# Patient Record
Sex: Male | Born: 1945 | Race: Black or African American | Hispanic: No | Marital: Married | Smoking: Former smoker
Health system: Southern US, Community
[De-identification: ages and names within clinical notes are randomized; demographics above are authoritative.]

## PROBLEM LIST (undated history)

## (undated) DIAGNOSIS — K219 Gastro-esophageal reflux disease without esophagitis: Secondary | ICD-10-CM

## (undated) DIAGNOSIS — I2510 Atherosclerotic heart disease of native coronary artery without angina pectoris: Secondary | ICD-10-CM

## (undated) DIAGNOSIS — I43 Cardiomyopathy in diseases classified elsewhere: Secondary | ICD-10-CM

## (undated) DIAGNOSIS — I482 Chronic atrial fibrillation, unspecified: Secondary | ICD-10-CM

## (undated) DIAGNOSIS — N400 Enlarged prostate without lower urinary tract symptoms: Secondary | ICD-10-CM

## (undated) DIAGNOSIS — M1990 Unspecified osteoarthritis, unspecified site: Secondary | ICD-10-CM

## (undated) DIAGNOSIS — N186 End stage renal disease: Secondary | ICD-10-CM

## (undated) DIAGNOSIS — I10 Essential (primary) hypertension: Secondary | ICD-10-CM

## (undated) DIAGNOSIS — Z992 Dependence on renal dialysis: Secondary | ICD-10-CM

## (undated) DIAGNOSIS — E854 Organ-limited amyloidosis: Secondary | ICD-10-CM

## (undated) DIAGNOSIS — C801 Malignant (primary) neoplasm, unspecified: Secondary | ICD-10-CM

## (undated) HISTORY — PX: AV FISTULA PLACEMENT: SHX1204

## (undated) HISTORY — PX: HERNIA REPAIR: SHX51

## (undated) HISTORY — PX: CHOLECYSTECTOMY: SHX55

## (undated) HISTORY — PX: NEPHRECTOMY: SHX65

---

## 2002-09-06 ENCOUNTER — Ambulatory Visit (HOSPITAL_COMMUNITY): Admission: RE | Admit: 2002-09-06 | Discharge: 2002-09-06 | Payer: Self-pay | Admitting: Orthopaedic Surgery

## 2002-09-06 ENCOUNTER — Encounter: Payer: Self-pay | Admitting: Orthopaedic Surgery

## 2003-02-24 DIAGNOSIS — I482 Chronic atrial fibrillation, unspecified: Secondary | ICD-10-CM

## 2003-02-24 HISTORY — DX: Chronic atrial fibrillation, unspecified: I48.20

## 2003-03-09 ENCOUNTER — Ambulatory Visit (HOSPITAL_COMMUNITY): Admission: RE | Admit: 2003-03-09 | Discharge: 2003-03-09 | Payer: Self-pay | Admitting: Family Medicine

## 2003-03-09 ENCOUNTER — Encounter: Payer: Self-pay | Admitting: Family Medicine

## 2003-03-22 ENCOUNTER — Ambulatory Visit (HOSPITAL_COMMUNITY): Admission: RE | Admit: 2003-03-22 | Discharge: 2003-03-22 | Payer: Self-pay | Admitting: Family Medicine

## 2003-03-22 ENCOUNTER — Encounter: Payer: Self-pay | Admitting: Family Medicine

## 2003-04-09 ENCOUNTER — Encounter: Payer: Self-pay | Admitting: Urology

## 2003-04-09 ENCOUNTER — Ambulatory Visit (HOSPITAL_COMMUNITY): Admission: RE | Admit: 2003-04-09 | Discharge: 2003-04-09 | Payer: Self-pay | Admitting: Urology

## 2003-04-12 ENCOUNTER — Encounter: Payer: Self-pay | Admitting: Urology

## 2003-04-12 ENCOUNTER — Encounter (HOSPITAL_COMMUNITY): Admission: RE | Admit: 2003-04-12 | Discharge: 2003-05-12 | Payer: Self-pay | Admitting: Urology

## 2003-10-05 ENCOUNTER — Ambulatory Visit (HOSPITAL_COMMUNITY): Admission: RE | Admit: 2003-10-05 | Discharge: 2003-10-05 | Payer: Self-pay | Admitting: Family Medicine

## 2003-10-08 ENCOUNTER — Ambulatory Visit (HOSPITAL_COMMUNITY): Admission: RE | Admit: 2003-10-08 | Discharge: 2003-10-08 | Payer: Self-pay | Admitting: Family Medicine

## 2003-11-02 ENCOUNTER — Ambulatory Visit (HOSPITAL_COMMUNITY): Admission: RE | Admit: 2003-11-02 | Discharge: 2003-11-02 | Payer: Self-pay | Admitting: Internal Medicine

## 2003-12-11 ENCOUNTER — Ambulatory Visit (HOSPITAL_COMMUNITY): Admission: RE | Admit: 2003-12-11 | Discharge: 2003-12-11 | Payer: Self-pay | Admitting: Family Medicine

## 2004-05-26 ENCOUNTER — Ambulatory Visit (HOSPITAL_COMMUNITY): Admission: RE | Admit: 2004-05-26 | Discharge: 2004-05-26 | Payer: Self-pay | Admitting: Family Medicine

## 2004-06-07 ENCOUNTER — Emergency Department (HOSPITAL_COMMUNITY): Admission: EM | Admit: 2004-06-07 | Discharge: 2004-06-07 | Payer: Self-pay | Admitting: Emergency Medicine

## 2004-06-08 ENCOUNTER — Emergency Department (HOSPITAL_COMMUNITY): Admission: EM | Admit: 2004-06-08 | Discharge: 2004-06-09 | Payer: Self-pay | Admitting: *Deleted

## 2004-08-28 ENCOUNTER — Ambulatory Visit (HOSPITAL_COMMUNITY): Admission: RE | Admit: 2004-08-28 | Discharge: 2004-08-28 | Payer: Self-pay | Admitting: Cardiovascular Disease

## 2004-08-28 ENCOUNTER — Ambulatory Visit: Payer: Self-pay | Admitting: Cardiovascular Disease

## 2004-09-15 ENCOUNTER — Ambulatory Visit (HOSPITAL_COMMUNITY): Admission: RE | Admit: 2004-09-15 | Discharge: 2004-09-15 | Payer: Self-pay | Admitting: *Deleted

## 2004-09-22 ENCOUNTER — Inpatient Hospital Stay (HOSPITAL_COMMUNITY): Admission: AD | Admit: 2004-09-22 | Discharge: 2004-09-26 | Payer: Self-pay | Admitting: *Deleted

## 2004-09-23 ENCOUNTER — Encounter (INDEPENDENT_AMBULATORY_CARE_PROVIDER_SITE_OTHER): Payer: Self-pay | Admitting: Specialist

## 2004-11-30 ENCOUNTER — Inpatient Hospital Stay (HOSPITAL_COMMUNITY): Admission: EM | Admit: 2004-11-30 | Discharge: 2004-12-04 | Payer: Self-pay | Admitting: Emergency Medicine

## 2004-12-30 ENCOUNTER — Ambulatory Visit (HOSPITAL_COMMUNITY): Admission: RE | Admit: 2004-12-30 | Discharge: 2004-12-30 | Payer: Self-pay | Admitting: *Deleted

## 2005-03-17 ENCOUNTER — Emergency Department (HOSPITAL_COMMUNITY): Admission: EM | Admit: 2005-03-17 | Discharge: 2005-03-17 | Payer: Self-pay | Admitting: Emergency Medicine

## 2005-03-21 ENCOUNTER — Emergency Department (HOSPITAL_COMMUNITY): Admission: EM | Admit: 2005-03-21 | Discharge: 2005-03-21 | Payer: Self-pay | Admitting: Emergency Medicine

## 2005-03-21 ENCOUNTER — Ambulatory Visit: Payer: Self-pay | Admitting: Pulmonary Disease

## 2005-03-21 ENCOUNTER — Inpatient Hospital Stay (HOSPITAL_COMMUNITY): Admission: AD | Admit: 2005-03-21 | Discharge: 2005-03-27 | Payer: Self-pay | Admitting: Internal Medicine

## 2005-03-23 ENCOUNTER — Encounter (INDEPENDENT_AMBULATORY_CARE_PROVIDER_SITE_OTHER): Payer: Self-pay | Admitting: Cardiovascular Disease

## 2005-05-12 ENCOUNTER — Ambulatory Visit (HOSPITAL_COMMUNITY): Admission: RE | Admit: 2005-05-12 | Discharge: 2005-05-12 | Payer: Self-pay | Admitting: Urology

## 2005-10-21 ENCOUNTER — Encounter (HOSPITAL_COMMUNITY): Admission: RE | Admit: 2005-10-21 | Discharge: 2005-10-21 | Payer: Self-pay | Admitting: Nephrology

## 2005-10-21 ENCOUNTER — Ambulatory Visit (HOSPITAL_COMMUNITY): Payer: Self-pay | Admitting: Nephrology

## 2005-11-04 ENCOUNTER — Encounter (HOSPITAL_COMMUNITY): Admission: RE | Admit: 2005-11-04 | Discharge: 2005-12-04 | Payer: Self-pay | Admitting: Nephrology

## 2005-11-16 ENCOUNTER — Ambulatory Visit (HOSPITAL_COMMUNITY): Admission: RE | Admit: 2005-11-16 | Discharge: 2005-11-16 | Payer: Self-pay | Admitting: Vascular Surgery

## 2006-01-19 ENCOUNTER — Ambulatory Visit (HOSPITAL_COMMUNITY): Admission: RE | Admit: 2006-01-19 | Discharge: 2006-01-19 | Payer: Self-pay | Admitting: Nephrology

## 2006-06-29 ENCOUNTER — Ambulatory Visit (HOSPITAL_COMMUNITY): Admission: RE | Admit: 2006-06-29 | Discharge: 2006-06-29 | Payer: Self-pay | Admitting: Internal Medicine

## 2007-05-17 ENCOUNTER — Ambulatory Visit: Payer: Self-pay | Admitting: Vascular Surgery

## 2007-07-06 ENCOUNTER — Ambulatory Visit (HOSPITAL_COMMUNITY): Admission: RE | Admit: 2007-07-06 | Discharge: 2007-07-06 | Payer: Self-pay | Admitting: Nephrology

## 2007-08-03 ENCOUNTER — Ambulatory Visit: Payer: Self-pay | Admitting: Vascular Surgery

## 2007-08-03 ENCOUNTER — Ambulatory Visit (HOSPITAL_COMMUNITY): Admission: RE | Admit: 2007-08-03 | Discharge: 2007-08-03 | Payer: Self-pay | Admitting: Vascular Surgery

## 2007-08-04 ENCOUNTER — Ambulatory Visit (HOSPITAL_COMMUNITY): Admission: RE | Admit: 2007-08-04 | Discharge: 2007-08-04 | Payer: Self-pay | Admitting: Vascular Surgery

## 2007-08-11 ENCOUNTER — Ambulatory Visit (HOSPITAL_COMMUNITY): Admission: RE | Admit: 2007-08-11 | Discharge: 2007-08-11 | Payer: Self-pay | Admitting: Surgery

## 2007-11-21 ENCOUNTER — Ambulatory Visit (HOSPITAL_COMMUNITY): Admission: RE | Admit: 2007-11-21 | Discharge: 2007-11-21 | Payer: Self-pay | Admitting: Nephrology

## 2008-03-14 ENCOUNTER — Ambulatory Visit (HOSPITAL_COMMUNITY): Admission: RE | Admit: 2008-03-14 | Discharge: 2008-03-14 | Payer: Self-pay | Admitting: Nephrology

## 2008-03-28 ENCOUNTER — Ambulatory Visit (HOSPITAL_COMMUNITY): Admission: RE | Admit: 2008-03-28 | Discharge: 2008-03-28 | Payer: Self-pay | Admitting: Nephrology

## 2008-06-20 ENCOUNTER — Ambulatory Visit (HOSPITAL_COMMUNITY): Admission: RE | Admit: 2008-06-20 | Discharge: 2008-06-20 | Payer: Self-pay | Admitting: Nephrology

## 2008-09-11 ENCOUNTER — Ambulatory Visit (HOSPITAL_COMMUNITY): Admission: RE | Admit: 2008-09-11 | Discharge: 2008-09-11 | Payer: Self-pay | Admitting: Nephrology

## 2008-11-26 ENCOUNTER — Ambulatory Visit (HOSPITAL_COMMUNITY): Admission: RE | Admit: 2008-11-26 | Discharge: 2008-11-26 | Payer: Self-pay | Admitting: Nephrology

## 2009-10-22 ENCOUNTER — Ambulatory Visit (HOSPITAL_COMMUNITY): Admission: RE | Admit: 2009-10-22 | Discharge: 2009-10-22 | Payer: Self-pay | Admitting: Nephrology

## 2010-01-21 ENCOUNTER — Ambulatory Visit (HOSPITAL_COMMUNITY): Admission: RE | Admit: 2010-01-21 | Discharge: 2010-01-21 | Payer: Self-pay | Admitting: Family Medicine

## 2010-10-10 ENCOUNTER — Ambulatory Visit (HOSPITAL_COMMUNITY)
Admission: RE | Admit: 2010-10-10 | Discharge: 2010-10-10 | Payer: Self-pay | Source: Home / Self Care | Attending: Nephrology | Admitting: Nephrology

## 2010-11-15 ENCOUNTER — Encounter: Payer: Self-pay | Admitting: Family Medicine

## 2010-11-16 ENCOUNTER — Encounter: Payer: Self-pay | Admitting: Nephrology

## 2011-03-10 NOTE — Op Note (Signed)
[name], [name]                ACCOUNT NO.:  [id number]   MEDICAL RECORD NO.:  [id number]          PATIENT TYPE:  AMB   LOCATION:  SDS                          FACILITY:  [facility]   PHYSICIAN:  Juleen China IV, MDDATE OF BIRTH:  1946/05/03   DATE OF PROCEDURE:  08/11/2007  DATE OF DISCHARGE:  08/11/2007                               OPERATIVE REPORT   PROCEDURES PERFORMED:  1. Central venogram.  2. Ultrasound guided right cidofovir access.   PREOPERATIVE DIAGNOSIS:  End stage renal disease.   POSTOPERATIVE DIAGNOSIS:  End stage renal disease.   INDICATIONS FOR PROCEDURE:  A 65 year old who had been dialyzing through  a left arm fistula which thrombosed.  At the time of thrombectomy, there  was difficulty passing the balloon centrally.  The fistula ultimately  rethrombosed.  He now dialyzes through a catheter.  Prior to proceeding  with upper arm graft, I wanted to study his central veins and intervene  should a stenosis be discovered.  This was discussed with the patient  and informed consent was signed.   PROCEDURE:  The patient was identified in the holding area and taken to  room 8.  He was placed supine on the table.  Bilateral groins were  prepped and draped in the standard sterile fashion.  Lidocaine 1% was  used for local anesthesia.  Ultrasound was used to evaluate the right  common femoral vein which is noted to be widely patent.  Under  ultrasound guidance, the right common femoral vein was accessed with an  18-gauge needle.  An 0.035 Teena Dunk wire was then advanced into the  inferior vena cava under fluoroscopic visualization.  Next, a 5-French  sheath was placed.  Using a Berenstein catheter and a Benson wire, the  catheter was placed into the left brachial vein.  Multiple injections  were taken beginning in the brachial vein, injecting in the axillary as  well as the subclavian veins.   FINDINGS:  There was no evidence of central stenosis in the left.  The  brachial veins are patent as is the axillary subclavian and superior  vena cava.  After these findings, the procedure was terminated.  Manual  pressure was used for hemostasis.  The patient tolerated the procedure  without complications.      Jorge Ny, MD  Electronically Signed     VWB/MEDQ  D:  08/11/2007  T:  08/11/2007  Job:  [id number]

## 2011-03-10 NOTE — Op Note (Signed)
NAMEMILO, [name]                ACCOUNT NO.:  [id number]   MEDICAL RECORD NO.:  [id number]          PATIENT TYPE:  AMB   LOCATION:  SDS                          FACILITY:  [facility]   PHYSICIAN:  Di Kindle. Edilia Bo, M.D.DATE OF BIRTH:  [birth year]   DATE OF PROCEDURE:  08/04/2007  DATE OF DISCHARGE:  08/04/2007                               OPERATIVE REPORT   PROCEDURE PERFORMED:  Ultrasound guided placement of a right IJ Diatek  catheter.   PREOPERATIVE DIAGNOSIS:  Chronic renal failure.   POSTOPERATIVE DIAGNOSIS:  Chronic renal failure.   SURGEON:  Waverly Ferrari, M.D.   ANESTHESIA:  Local with sedation.   TECHNIQUE:  The patient was taken to the operating room and the neck and  upper chest were prepped and draped in the usual sterile fashion.  The  ultrasound scan was used to identify the IJ.  After the skin was  anesthetized the IJ was cannulated and the guidewire introduced into the  superior vena cava under fluoroscopic control.  The tract over the wire  was dilated and then a dilator and peel away sheath were passed over the  wire and the wire and dilator were removed.  The catheter was positioned  in the right atrium.  The exit site for the catheter was selected and  the skin anesthetized between the two areas.  The catheter was then  brought through the tunnel, cut to the appropriate length, and the  distal ports were attached.  Both ports withdrew easily, were then  flushed with heparinized saline and filled with concentrated heparin.  The catheter was secured at its exit site with a 3-0 nylon suture.  The  IJ cannulation site was closed with a 4-0 subcuticular stitch.  A  sterile dressing was applied.   The patient tolerated the procedure well and was transferred to the  recovery room in satisfactory condition.      Di Kindle. Edilia Bo, M.D.  Electronically Signed     CSD/MEDQ  D:  08/04/2007  T:  08/12/2007  Job:  [id number]   cc:   Di Kindle.  Edilia Bo, M.D.

## 2011-03-10 NOTE — Assessment & Plan Note (Signed)
OFFICE VISIT   [name], [name]  DOB:  1946/07/19                                       05/17/2007  ZOXWR#:[id number]   [name] has an AV fistula in his left upper arm which I created in  January 2007 and has functioned nicely.  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  He  has had no significant bleeding episodes and no infection.  Dr. Kristian Covey  was concerned about whether anything should be done about it at this  time.  The only option would be to replace the entire fistula with an  upper arm graft but I do think that some more utilization could be  obtained from this fistula as long as he is not having bleeding episodes  or infection.  The patient would like to pursue this option.  I  discussed with him the fact that if there were bleeding episodes, to be  sure and get in touch with [place] and we would then schedule him for  replacement of this fistula with an upper arm graft.   Quita Skye Hart [place], M.D.  Electronically Signed   JDL/MEDQ  D:  05/17/2007  T:  05/18/2007  Job:  [id number]

## 2011-03-10 NOTE — Op Note (Signed)
[name], [name]                ACCOUNT NO.:  [id number]   MEDICAL RECORD NO.:  [id number]          PATIENT TYPE:  AMB   LOCATION:  SDS                          FACILITY:  [facility]   PHYSICIAN:  Juleen China IV, MDDATE OF BIRTH:  1946/02/08   DATE OF PROCEDURE:  08/11/2007  DATE OF DISCHARGE:  08/11/2007                               OPERATIVE REPORT   PREOPERATIVE DIAGNOSIS:  End-stage renal disease.   POSTOPERATIVE DIAGNOSIS:  End-stage renal disease.   PROCEDURE PERFORMED:  Left upper arm __________  graft.   ANESTHESIA:  MAC.   BLOOD LOSS:  Minimal.   SPECIMENS:  None.   DRAINS:  None.   CULTURES:  None.   FINDINGS:  Adequate vein.   PROCEDURE:  The patient was identified in the holding area and taken to  room 9.  He was placed supine on the table.  The left arm was prepped  and draped in standard sterile fashion.  A time out was called,  perioperative antibiotics were administered.  After 1% lidocaine was  used for local anesthesia a longitudinal incision was made at the medial  axilla and the bicipital groove.  Bovie cautery was used to dissect  through the subcutaneous tissue. Crural fascia was identified and opened  sharply.  There was a dominant brachial vein, which appeared to be  adequate for access.  This was mobilized and dissected out  circumferentially.  Once adequate vein had been exposed attention was  turned towards the arterial end.  Lidocaine 1% was used for local  anesthesia.  A longitudinal incision was made over the palpable pulse of  brachial artery above the antecubital crease.  Bovie cautery was used to  dissect through the subcutaneous tissue.  The fascia was opened sharply.  The brachial artery was mobilized proximally and distally until enough  length had been achieved.  At this point in time a subcutaneous tunnel  was created using a curved tunneler.  Once the tunnel was complete the  patient was given systemic heparinization.  A 6-mm  stretch Gore-Tex  graft was then selected.  Vascular clamps were placed proximally and  distally on the brachial artery.  The brachial artery was opened with a  #11 blade and extended with Potts scissors.  The 6-mm graft was cut to  fit the arteriotomy.  Then anastomosis was performed using a running CV-  6 Gore suture.  Prior to completion of the anastomosis the brachial  artery was flushed in antegrade and retrograde fashion.  The anastomosis  was then secured.  At this point in time attention was turned towards  the anus end.  The vein was occluded proximally and distally, and opened  with a #11 blade.  This was extended with Potts scissors.  The graft was  beveled and a long venous anastomosis was performed.  This was done  using a running CV-6 suture.  The graft then was appropriately flushed,  and then the anastomosis was secured.  There was a good thrill within  the axillary vein following completion of the anastomosis.  There was a  good  thrill within the graft.  The patient had a palpable radial pulse.  At this point, the wounds were  copiously irrigated.  The deep tissue was reapproximated in several  areas with interrupted 3-0 Vicryl.  The skin was closed with a running 4-  0 Vicryl.  Dermabond was used on the skin.   The patient tolerated the procedure well.  Was returned to the recovery  room in stable condition.      Jorge Ny, MD  Electronically Signed     VWB/MEDQ  D:  08/11/2007  T:  08/12/2007  Job:  [phone]

## 2011-03-10 NOTE — Op Note (Signed)
NAMECORNELL, [name]                ACCOUNT NO.:  [id number]   MEDICAL RECORD NO.:  [id number]          PATIENT TYPE:  AMB   LOCATION:  SDS                          FACILITY:  [facility]   PHYSICIAN:  Di Kindle. Edilia Bo, M.D.DATE OF BIRTH:  06/17/1946   DATE OF PROCEDURE:  08/04/2007  DATE OF DISCHARGE:  08/04/2007                               OPERATIVE REPORT   PREOPERATIVE DIAGNOSIS:  Chronic renal failure.   POSTOPERATIVE DIAGNOSIS:  Chronic renal failure.   PROCEDURE:  Ultrasound-guided placement of a right internal jugular  Diatek catheter.   SURGEON:  Di Kindle. Edilia Bo, M.D.   ANESTHESIA:  Local with sedation.   TECHNIQUE:  The patient was taken to the operating room.  The neck and  upper chest were prepped and draped in the usual sterile fashion.  The  ultrasound scanner was used to locate the IJ on the right.  After the  skin was anesthetized, the right IJ was cannulated and a guidewire  introduced into the superior vena cava under fluoroscopic control.  The  tract over the wire was dilated and then a dilator and peel-away sheath  were passed over the wire and wire and dilator were removed.  The  catheter was passed through the peel-away sheath and positioned in the  right atrium.  The exit site for the catheter was selected and the skin  anesthetized between the two areas.  The catheter was then brought  through the tunnel, cut to the appropriate length, and the distal ports  were attached.  Both ports withdrew easily and then flushed with  heparinized saline, then filled with concentrated heparin.  The catheter  was secured at its exit site with a 3-0 nylon suture.  The IJ  cannulation site was closed with a 4-0 subcuticular stitch.  A sterile  dressing was applied.  The patient tolerated the procedure well, was  transferred to the recovery room in satisfactory condition.  All needle  and sponge counts were correct.      Di Kindle. Edilia Bo, M.D.  Electronically Signed     CSD/MEDQ  D:  08/04/2007  T:  08/05/2007  Job:  [id number]

## 2011-03-10 NOTE — Procedures (Signed)
OPERATIVE REPORT   YAASIR, MENKEN  DOB:  1946/07/20                                       08/04/2007  VWUJW#:[id number]   PROCEDURE PERFORMED:  Ultrasound guided placement of a right IJ Diatek  catheter.   PREOPERATIVE DIAGNOSIS:  Chronic renal failure.   POSTOPERATIVE DIAGNOSIS:  Chronic renal failure.   SURGEON:  Waverly Ferrari, M.D.   ANESTHESIA:  Local with sedation.   TECHNIQUE:  The patient was taken to the operating room and the neck and  upper chest were prepped and draped in the usual sterile fashion.  The  ultrasound scan was used to identify the IJ.  After the skin was  anesthetized the IJ was cannulated and the guidewire introduced into the  superior vena cava under fluoroscopic control.  The tract over the wire  was dilated and then a dilator and peel away sheath were passed over the  wire and the wire and dilator were removed.  The catheter was positioned  in the right atrium.  The exit site for the catheter was selected and  the skin anesthetized between the two areas.  The catheter was then  brought through the tunnel, cut to the appropriate length, and the  distal ports were attached.  Both ports withdrew easily, were then  flushed with heparinized saline and filled with concentrated heparin.  The catheter was secured at its exit site with a 3-0 nylon suture.  The  IJ cannulation site was closed with a 4-0 subcuticular stitch.  A  sterile dressing was applied.   The patient tolerated the procedure well and was transferred to the  recovery room in satisfactory condition.   Di Kindle. Edilia Bo, M.D.  Electronically Signed   CSD/MEDQ  D:  08/04/2007  T:  08/04/2007  Job:  [id number]   cc:   Di Kindle. Edilia Bo, M.D.

## 2011-03-10 NOTE — Op Note (Signed)
NAMEKEYONTAE, HUCKEBY                ACCOUNT NO.:  [id number]   MEDICAL RECORD NO.:  [id number]          PATIENT TYPE:  AMB   LOCATION:  SDS                          FACILITY:  [facility]   PHYSICIAN:  Quita Skye. Hart [place], M.D.  DATE OF BIRTH:  Aug 20, 1946   DATE OF PROCEDURE:  08/03/2007  DATE OF DISCHARGE:  08/03/2007                               OPERATIVE REPORT   PREOPERATIVE DIAGNOSIS:  End-stage renal disease with thrombosed left  upper arm AV fistula.   POSTOPERATIVE DIAGNOSIS:  End-stage renal disease with thrombosed left  upper arm AV fistula.   OPERATIONS:  A simple thrombectomy of left upper arm fistula with  intraoperative fistulogram.   SURGEON:  Quita Skye. Hart [place], M.D.   ASSISTANT:  Nurse.   ANESTHESIA:  Local.   PROCEDURE:  The patient was taken to the operating room, placed in the  supine position at which time the left upper extremity was prepped with  Betadine scrub and solution and draped in a routine sterile manner.  After infiltration of 1% Xylocaine with epinephrine, transverse incision  was made through the previous scar in the antecubital area.  The  anastomosis between the cephalic vein and the brachial artery in the  distal upper arm was dissected free, proximal and distal control  obtained.  He was given 3000 units of heparin intravenously.  Transverse  opening was made in the cephalic vein about 3 cm from its arterial  anastomosis and the fistula was filled with thrombus.  A #5 Fogarty  catheter was then passed proximally up the fistula which was quite  tortuous and moderately dilated.  It did traverse this area without  difficulty up to about 40 cm up into the shoulder area and I was never  able to get the Fogarty go beyond this point.  I was able to completely  thrombectomize the fistula by external compression by macerating the  clot externally and expressing it with a laparotomy pad and there was  excellent venous backbleeding.  The arterial end was also  easily  thrombectomized with good inflow.  Following this, the fistula was  reclosed with continuous 6-0 Prolene.  Clamp released and there was a  good pulse and good audible Doppler flow throughout the fistula.  Intraoperative fistulogram revealed a dilated cephalic vein in the upper  arm, but no areas of stenosis.   Wound was then closed in layers with Vicryl in a subcuticular fashion.  Sterile dressing applied.  The patient will have a fistulogram  postoperatively to see if PTA of a proximal cephalic vein stenosis could  be performed if in fact that is present.      Quita Skye Hart [place], M.D.  Electronically Signed     JDL/MEDQ  D:  08/03/2007  T:  08/04/2007  Job:  [id number]

## 2011-03-13 NOTE — Op Note (Signed)
[name], [name]                ACCOUNT NO.:  [id number]   MEDICAL RECORD NO.:  [id number]          PATIENT TYPE:  OIB   LOCATION:  2855                         FACILITY:  [facility]   PHYSICIAN:  Mark E. Severiano Gilbert, M.D.    DATE OF BIRTH:  Oct 18, 1946   DATE OF PROCEDURE:  12/30/2004  DATE OF DISCHARGE:                                 OPERATIVE REPORT   PROCEDURES PERFORMED:  1.  Comprehensive electrophysiology study with arrhythmia induction attempt.  2.  Programmed electrical stimulation and electrophysiology study post      infusion of isoproterenol.   PREPROCEDURE DIAGNOSIS:  Unexplained syncope during hypokalemia.   POSTPROCEDURE DIAGNOSIS:  Unexplained syncope during hypokalemia.   OPERATOR:  Mark E. Severiano Gilbert, M.D.   COMPLICATIONS:  None.   ESTIMATED BLOOD LOSS:  Less than 30 mL.   PROCEDURE IN DETAIL:  The patient was brought to the EP laboratory in fasted  state.  The patient was prepped and draped in usual manner.  A quadripolar  fixed curved catheter was advanced via the right femoral vein after  accessing the venous circulation with a 6 French sheath.  Standard thin  walled needle technique was used after local anesthesia was obtained with 1%  lidocaine injection.  The quadripolar catheter was position in a variety of  positions within the heart to include the His' bundle region, the right  atrium and the right ventricular apex, as well as the right ventricular  outflow tract.  A second catheter was advanced through a second 6 French  sheath under fluoroscopic guidance and also was positioned within multiple  sites within the right atrium and right ventricle.  After measuring baseline  intervals, programmed electrical stimulation was performed for multiple  sites in the right atrium, as well as the RV apex and right ventricular  outflow tract.  After completion of programmed electrical stimulation,  isoproterenol was infused for measuring baseline intervals again, as well  as  burst pacing in the right atrium looking for AV block.  After the conclusion  of electrophysiology study, all catheters and sheaths were removed without  difficulty.  Hemostasis was obtained with direct pressure.  The patient  tolerated the procedure well, being returned to the holding area in stable  hemodynamic condition.   The baseline rhythm was normal sinus rhythm with a cycle length of 747 msec.  The PR interval, QRS duration and QT interval all were within normal limits.  The measured AH interval was 114 msec and the baseline HB interval was 74  msec.  There was no unusual splitting or fractionation of the HV signal  noted.  Corrected sinus node recovery times were performed in a number of  cycle lengths between 700 and 550 msec.  All corrected sinus node recovery  times were less than 500 msec.  The antegrade block cycle length from the  right atrial appendage via the AV node was 460 msec and block appeared to be  above His'.  The antegrade AV node ERP off a drive train of 161 msec was  noted to be 370 msec, again with block  above the His'.  These findings were  all consistent with normal SA and AV nodal conduction properties.  No  inductions of supraventricular tachycardia.  Isoproterenol was infused at  doses of 1-2 mcg to elevate his resting heart rate at least to 50%,  approximately 90-100 beats per minute.  There was no increased prolongation  of the HV interval during isoproterenol infusion with the measured HV  interval of approximately 80 msec.  Burst pacing down to a cycle length of  500 msec also demonstrated no prolongation of the HV interval or AV node  block.  The isoproterenol was allowed to wear off and ventricular programmed  electrostimulation from the right ventricular apex and right ventricular  outflow tract were performed.  All sites were interrogated with two cycle  lengths at 600 and 400 msec up to a maximum of triple extraventricular  stimuli.  No  ventricular arrhythmias were induced.   IMPRESSION:  1.  Noninducible for ventricular arrhythmia.  2.  Appropriate sinoatrial and atrioventricular node function.  3.  Modest increase in HV, although not a sufficient degree to warrant      pacemaker implantation.  Unable to further stress the HV interval with      burst pacing or isoproterenol infusion.   RECOMMENDATIONS:  As the patient does not having a class I or class II  indication for implantation of ICD or pacemaker, we will not proceed to  device implantation.  His syncope may represent some very unusual arrhythmic  event or nonarrhythmic syncope.  His overall ejection fraction is relatively  preserved in the 45% range despite a diagnosis of amyloidosis.  Should  syncope recur, it would not be unreasonable to provide prolonged ambulatory  EKG recording with a loop recorder.      MEP/MEDQ  D:  12/30/2004  T:  12/30/2004  Job:  [id number]

## 2011-03-13 NOTE — Procedures (Signed)
[name], Larry                ACCOUNT NO.:  [id number]   MEDICAL RECORD NO.:  [id number]          PATIENT TYPE:  INP   LOCATION:  A219                          FACILITY:  [facility]   PHYSICIAN:  Edward L. Juanetta Gosling, M.D.DATE OF BIRTH:  1946/09/25   DATE OF PROCEDURE:  12/02/2004  DATE OF DISCHARGE:                                EKG INTERPRETATION   PROCEDURE:  EKG.   TIME:  651   INTERPRETATION:  The rhythm is sinus rhythm with a rate in the 80s.  There  is an incomplete right bundle branch block.  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  There is possible right ventricular hypertrophy.  There are Q-waves  anteriorly probably from the incomplete right bundle branch block but it  could be related to other problems such as a previous anterolateral  infarction and clinical correlation is suggested.  Abnormal  electrocardiogram.      ELH/MEDQ  D:  12/02/2004  T:  12/03/2004  Job:  [id number]

## 2011-03-13 NOTE — Discharge Summary (Signed)
[name], [name]                ACCOUNT NO.:  [id number]   MEDICAL RECORD NO.:  [id number]          PATIENT TYPE:  INP   LOCATION:  2038                         FACILITY:  [facility]   PHYSICIAN:  Dani Gobble, MD       DATE OF BIRTH:  1945/11/14   DATE OF ADMISSION:  09/22/2004  DATE OF DISCHARGE:  09/26/2004                                 DISCHARGE SUMMARY   DISCHARGE DIAGNOSES:  1.  Abnormal Cardiolite study with catheterization this admission revealing      nonobstructive coronary disease with a 30% right coronary artery, 30%      circumflex and 30% narrowing in the left anterior descending.  2.  Mild left ventricular dysfunction with an ejection fraction of 45-50%.  3.  Hypertension with left ventricular hypertrophy.  4.  Question of amyloidosis.  Pathology this admission did not indicate      evidence of amyloidosis or sarcoidosis.  A biopsy has been sent to the      [facility].  5.  Renal insufficiency with a creatinine bump post catheterization to 1.9,      down to 1.7 at discharge.  6.  History of prior left nephrectomy.   HISTORY OF PRESENT ILLNESS:  [name] is a 65 year old African American  male seen by Dr. Domingo [name] for a possibility of a restrictive cardiomyopathy.  He was initially seen in May of 2003 for an abnormal EKG.  A stress test  then was negative.  He was referred back to Dr. Domingo [name] earlier this year  for increasing edema.  Echocardiogram revealed normal LV size and function  with LVH.  There was moderate RVH.  RV pressures were 30-40 mmHg.  There was  a small circumferential pericardial effusion without evidence of compromise.  There was no right atrial inversion or RV collapse.  CT of his abdomen  showed diffuse mesenteric subacute edema with bilateral pleural effusions  and some ascites.  He has had a prior nephrectomy for renal cell carcinoma.  Evaluation for possible etiologies of restrictive cardiomyopathy was  undertaken by Dr. Domingo [name], including  iron studies, rheumatoid factor and  Bence Jones protein.  The patient had a cardiac MRI by Dr. Eden [name] with no  definite suggestion of infiltrative process noted.  Dr. Domingo [name] decided to  admit him for diagnostic catheterization.  This was done by Dr. Jenne [name] on  September 23, 2004, which revealed nonobstructive coronary disease as noted.  His creatinine did bump to 1.9 post procedure and he was kept an extra 48  hours for hydration.  His ARB and diuretics were held.  We feel he can be  discharged on September 26, 2004.  His creatinine is 1.7.  His pathology  showed no evidence of amyloidosis or sarcoidosis, although there was some  lymphocytosis possibly suggestive of a myocarditis.  The patient will sent  home and follow up with Dr. Domingo [name] on October 06, 2004.  Will hold his  Benicar, torsemide and potassium until she sees him in the office.  He will  have a BMP next week.   DISCHARGE MEDICATIONS:  1.  Nexium 40 mg a day.  2.  Allopurinol 300 mg a day.  3.  Terazosin 5 mg at h.s.  4.  Coated aspirin daily.  5.  Toprol XL 50 mg a day was added as the patient had runs of 4-beat      nonsustained ventricular tachycardia during this admission.   LABORATORIES AT DISCHARGE:  Sodium 139, potassium 3.5, BUN 16, creatinine  1.7.  Magnesium was 2.1.  White count 4.4, hemoglobin 11.5, hematocrit 33.7,  platelets 171.  The AST was slightly elevated at 48 with an ALP of 171.  Urinalysis shows a small amount of bilirubin and proteinuria.  Chest x-ray  shows some cardiomegaly with generalized peribronchial thickening.  EKG  reveals sinus rhythm with low voltage, poor anterior R-wave progression and  septal Q waves.   DISPOSITION:  [name] is discharged in stable condition.  He will receive  some potassium before going home.  He will have a BMP next week.  He has an  appointment to see Dr. Domingo [name] on October 06, 2004.       LKK/MEDQ  D:  09/26/2004  T:  09/27/2004  Job:  [id number]   cc:    [name], M.D.  [address], [address]  [place]  [place] [address]  Fax: [phone]   [name], M.D.   Norval Gable. [name], M.D.  [address] [place]  [place] [address]  Fax: [phone]   [name], M.D.  [facility]  [address]  [place], [place] [address]

## 2011-03-13 NOTE — Cardiovascular Report (Signed)
NAMEORRIN, YURKOVICH                ACCOUNT NO.:  [id number]   MEDICAL RECORD NO.:  [id number]          PATIENT TYPE:  INP   LOCATION:  2038                         FACILITY:  [facility]   PHYSICIAN:  Darlin Priestly, MD  DATE OF BIRTH:  1946-06-05   DATE OF PROCEDURE:  09/23/2004  DATE OF DISCHARGE:                              CARDIAC CATHETERIZATION   PROCEDURES:  1.  Right heart catheterization.  2.  Left heart catheterization.  3.  Coronary angiography.  4.  Right ventricular endomyocardial biopsy.   SURGEON:  Darlin Priestly, M.D. __________.   INDICATIONS FOR PROCEDURE:  Mr. Galbraith is a 65 year old male patient of Patrica Duel, M.D., with a history of increasing shortness of breath and lower  extremity edema.  He has had a 2-D echocardiogram  revealing LVH with RV  dilation and moderate RVH as well as moderate to severe TR and mild  pulmonary hypertension.  He has undergone Cardiolite testing which suggested  mild anterolateral wall ischemia with an EF of 56%.  He did undergo a  cardiac MRI trying to assess possibility of amyloidosis, however, this was  inconclusive.  He also had fat pad biopsy performed by Dr. Londell Moh again  which did not suggest amyloid in the specimens received.  He has continued  to complain of increasing symptoms and is now referred for right and left  heart catheterization with RV endomyocardial biopsy.   DESCRIPTION OF PROCEDURE:  After giving full written consent, the patient  was brought to the cardiac catheterization lab.  His right and left groin  was shaved, prepped and draped in usual sterile fashion.  ECG monitor  established.  Using modified Seldinger technique a 7 [place] short venous  sheath inserted in the right femoral vein and a 6 [place] arterial sheath  inserted in the right femoral artery.  Next, under fluoroscopic guidance a  #7 [place] Swan-Ganz catheter floated into the RA, RV, PA and wedge position.  Hemodynamic measures are  obtained. It should be noted that while the  catheter was in the right atrium, he was noted to have a steep wide stent on  his X/Y tracing as well as a positive Kussmaul's sign.   A 6 French pigtail catheter was advanced into the LV apex and measurements  were obtained.  Next, simultaneous LV and RV tracings were obtained.  There  was diastolic same noted with overlapping traces consistent with  constrictive disease.  Hand injection was then performed in the LV revealing  a mildly depressed EF of 45 to 50% with mild anterolateral hypokinesis.  A 6  French pigtail catheter was exchanged for standard left and right coronary  catheters and angiogram was then performed.  The left main was a large  vessel with no significant disease.  The LAD was a large vessel coursing the  apex __________ one large diagonal branch.  There was mild ectasia noted of  the LAD with mild 30% irregularities throughout but no high right stenosis.  There was TIMI-2 to 3 flow filling throughout the entire LAD as well as  large bifurcating diagonal.  The left circumflex was a large vessel coursing the AV groove and gives rise  to two obtuse marginal branches.  Again, the circumflex is mildly ectatic  with mild 30% proximal narrowing.  First and second OM are medium size  vessels with no significant disease.   The right coronary artery is a large vessel which is ectatic with scattered  30% throughout its proximal, mid and distal segments.  There is a large PD  and posterolateral branch with no significant disease.  There is noted again  to be TIMI-2 to 3 flow in the distal RCA, PD and posterolateral branch.   Hemodynamics:  RA 19, RV 36/7, PA 39/23, pulmonary capillary wedge pressure  23, systemic arterial pressure 121/89, Left ventricular pressure 118/11,  LVEDP 20, cardiac outflow 5.0, cardiac index 2.5, PA saturation 60%, AO  saturation 92%.   Next, the 7 [place] short venous sheath then exchanged for a 100 cm 7  [place]  sheath.  This was preloaded with a 7 French pigtail catheter and they were  advanced over the retained guidewire into the IVC.  The long sheaths were  then positioned in the proximal IVC as the pigtail was then advanced into  the right atrium.  A 0.35 J guidewire was then advanced out of the pigtail  catheter and used to cross the tricuspid valve.  The pigtail catheter was  then tracked over the guidewire and positioned in the RV apex.  The sheath  was then advanced over the pigtail catheter and positioned in the RV apex.  This was then connected to transducer and RV tracings were confirmed.  Next,  the 7 French Bioptome catheter was repeatedly advanced out of the RV sheath  and six endomyocardial biopsies were obtained without apparent  complications.  Hemodynamic measurements were monitored throughout.  These  specimens were then placed in Formalin.  One was sent to our lab and five  pieces were sent to [facility] clinic.  Following this again RV tracing was  confirmed and the sheath was pulled back into the IVC.  The sheath was then  exchanged over an exchange J wire for a short 7 French sheath and all  catheters and wires were then removed.  The patient tolerated the procedure  well.   CONCLUSION:  1.  No significant coronary artery disease though the patient is noted to      have ectatic vessels with TIMI-2 to 3 flow throughout.  2.  Mildly depressed left ventricular systolic function.  3.  Elevated pulmonary capillary wedge pressure with mild pulmonary      hypertension.  4.  Cardiac output 5.0, cardiac index 2.5.  5.  PA saturation 60%, AO saturation 92%.  6.  Right ventricular/left ventricular diastolic same consistent with      probable restrictive process.  7.  Successful endomyocardial biopsy with six right ventricular      endomyocardial biopsies obtained.      Robe   RHM/MEDQ  D:  09/23/2004  T:  09/23/2004  Job:  [id number]   cc:   Dani Gobble, MD  Fax:  [phone]  Patrica Duel, M.D.  [address]  [place]  [place] [address]  Fax: [phone]   Samule Ohm, M.D.  [facility]  [address]  [place], [place]  Phone:  [phone]

## 2011-03-13 NOTE — Consult Note (Signed)
[name], [name]                ACCOUNT NO.:  [id number]   MEDICAL RECORD NO.:  [id number]          PATIENT TYPE:  INP   LOCATION:  2102                         FACILITY:  [facility]   PHYSICIAN:  Dyke Maes, M.D.DATE OF BIRTH:  05-07-1946   DATE OF CONSULTATION:  03/24/2005  DATE OF DISCHARGE:                                   CONSULTATION   REFERRING PHYSICIAN:  Vonna Kotyk R. Jacinto Halim, MD   REASON FOR CONSULTATION:  Acute on chronic renal insufficiency.   HISTORY OF PRESENT ILLNESS:  A 65 year old black male admitted on May 27  from [facility] with hypotension and mental status changes.  Apparently the patient was hospitalized at [facility] earlier this month  for anasarca, at which time he was diuresed and discharged home with Foley  catheter because of severe scrotal edema.  The morning of admission,  however, he felt bad, had mental status changes, and was found to be  hypotensive.  Apparently there were no beds available at [facility] and he  was sent to [facility].  He has a history of amyloid cardiomyopathy and a  history of chronic renal insufficiency secondary to a solitary functioning  kidney and possibly amyloid.  Of the records that I have available, his  serum creatinine in December 2005 was 1.7, in February 2006 2.2, and on May  21, 20006, 2.5, and on admission 5.3.  Serum creatinine was followed at 4.8  on May 28, 4.8 on May 29, and today 5.1.  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  His systolic blood pressure, which was in the 60s at [facility], was 70s on admission here.  He had variable blood pressures  on May 28, dipping down as low as the 60s.  Yesterday afternoon his blood  pressure began improving with systolics in the 90-100 range, which was  associated with increasing urine output.  His urine output yesterday was  approximately 500 mL and today he  has already put out over 1 L.  A 24-hour  urine protein was done in February 2006, which showed only 497 mg of protein  per 24 hours.   PAST MEDICAL HISTORY:  1. 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  2. History of hypertension.  3. History of gout.  4. History of BPH.  5. History of chronic renal insufficiency.  6. Status post left nephrectomy secondary to renal cell carcinoma.  7. Status post cholecystectomy.  8. Status post left inguinal herniorrhaphy.     ALLERGIES:  None.   MEDICATIONS:  1. Protonix 40 mg a day.  2. Zyloprim 100 mg a day.  3. Solu-Cortef 100 mg q.8h.  4. Aranesp 100 mg each week.  5. Ferrous sulfate 325 mg t.i.d.  6. Cipro 400 mg a day.  7. He is also on dopamine and dobutamine drips.     SOCIAL HISTORY:  He is an ex-smoker.  He quit approximately 25 years ago.  He has four children.  He lives with his wife in [place].   FAMILY HISTORY:  Positive for diabetes and Alzheimer's in his mother.  His  father's past medical history is unknown.   REVIEW OF SYSTEMS:  Currently he denies any recent change in vision, no  shortness of breath, no chest pains or chest pressures.  Denies any nausea  or vomiting.  No diarrhea.  No new skin lesions.  No new neuropathic  symptoms.   PHYSICAL EXAMINATION:  VITAL SIGNS:  Blood pressure is 100/54, pulse is 80,  temperature 98.2.  GENERAL:  A 65 year old black male who is awake and alert, in no acute  distress.  HEENT:  Sclerae are anicteric.  Muscles are intact.  NECK:  Positive JVD.  CHEST:  Lungs clear to auscultation.  CARDIAC:  Regular rate and rhythm without a murmur, rub, or gallop.  ABDOMEN:  Positive bowel sounds, nontender, nondistended, no  hepatosplenomegaly.  BACK:  1+ presacral edema.  GENITOURINARY:  Marked scrotal edema with a Foley catheter in place.  EXTREMITIES:  3+ edema.  NEUROLOGIC:  Cranial nerves intact.  Motor and sensory intact.  No  asterixis.    LABORATORY DATA:  Sodium 139, potassium 3.5, bicarb 21, creatinine 5.1, BUN  133, albumin 3.0, calcium 8.6, hemoglobin 9.4.  White count 17.4.  Urinalysis on admission showed 21-50 wbc's, 21-50 rbc's.  It does not appear  a urine culture has been sent.   IMPRESSION:  1. Acute on chronic renal insufficiency, most likely secondary to ischemic      acute tubular necrosis from hypotension in the face of an angiotensin-      converting enzyme inhibitor.  His chronic renal insufficiency is most      likely secondary to solitary functioning kidney, hypertension, plus or      minus amyloid.  I would expect more than 497 mg of protein if      amyloidosis of his kidney was a major component of his renal      insufficiency.  2. Hypotension, probably secondary to sepsis.  3. Amyloid cardiomyopathy.     PLAN:  1. I would continue the pressors for now.  2. I would taper the steroids.  3. Check a urine culture.  4. Check phosphorus.  5. Hopefully, renal function will improve in the next few days.  He is      certainly not uremic now, and there is no indication for dialysis.      Fortunately, his urine output is improving.  I did discuss the      possibility of dialysis if renal function were to worsen and if it is      needed, he would be agreeable to it.   Thank you very much for the consult.  Will follow the patient with you.      MTM/MEDQ  D:  03/24/2005  T:  03/25/2005  Job:  [id number]

## 2011-03-13 NOTE — Consult Note (Signed)
NAMEANTONIS, Larry                          ACCOUNT NO.:  [id number]   MEDICAL RECORD NO.:  [id number]                  PATIENT TYPE:   LOCATION:                                       [facility]   PHYSICIAN:  Lionel December, M.D.                 DATE OF BIRTH:  1946/01/12   DATE OF CONSULTATION:  10/18/2003  DATE OF DISCHARGE:                                   CONSULTATION   REPORT TITLE:  GASTROENTEROLOGY CONSULTATION   CONSULTING PHYSICIAN:  Lionel December, M.D.   REFERRING PHYSICIAN:  Corrie Mckusick, M.D.   REASON FOR CONSULTATION:  Epigastric pain, abnormal upper GI series, H.  pylori.   HISTORY OF PRESENT ILLNESS:  [name] is a 65 year old black gentleman who  presents today [name] the request of Dr. Assunta Found for further evaluation of  the above-stated symptoms. For the last six weeks he has been having  epigastric pain/right upper quadrant abdominal pain, sometimes worse  postprandially. Sometimes unrelated to meals.  He denies any nausea,  vomiting, heartburn, weight loss, constipation, diarrhea, melena, or rectal  bleeding. He has been on Celebrex chronically. He was advised to hold this  five days ago, for which he has been doing.   He has been on PPI therapy for [name] least several years, Nexium for the last  one year.  He recently had an abdominal ultrasound which revealed some  thickening of the gallbladder wall up to 6.7 mm [name] least focally, several  small gallbladder wall polyps. The common bile duct diameter was 5.2. He had  a simple cyst on the mid pole of the right kidney.  The left kidney was  removed. An upper GI series was a markedly limited examination due to the  patient's inability to retain or ingest any significant amount of contrast.  He had significant GE reflux noted. There was questionable prominent gastric  folds, mucosal abnormalities cannot be excluded.  He also had positive H.  pylori serology. CBC was normal except for a slightly low  hemoglobin of  12.9. LFTs were normal.   CURRENT MEDICATIONS:  1. Nifedipine ER 60 mg daily.  2. Terazosin 5 mg daily.  3. Bisoprolol/HCTZ 2.5/6.25 mg daily.  4. Potassium chloride 20 mEq daily.  5. CoQ10 100 mg daily.  6. Nexium 40 mg daily.  7. Celebrex 200 mg daily, held on the last five days.  8. Biotin vitamin supplement daily.  9. Centrum Silver daily.  10.      Sucralfate 1 g q.i.d.  11.      Saw Palmetto 1/2 tablet daily.   ALLERGIES:  No known drug allergies.   PAST MEDICAL HISTORY:  1. History of gout.  2. Hypertension.  3. Benign prostatic hypertrophy.  4. Renal cell carcinoma, status post left nephrectomy in August 2004.  5. Personal history of colonic tubulovillous adenoma. Colonoscopy in     September 2000 revealed  3 cm polyp in the distal sigmoid colon and     multiple tiny polyps coagulated in the rectosigmoid area, but all polyps     were note treated. He had pancolonic diverticulosis treated and it was     recommended that he come back in two years to treat the rest of these     polyps.   FAMILY HISTORY:  Mother has dementia. She had a history of colorectal cancer  several years ago.   SOCIAL HISTORY:  He has been married x37 years, has four children. He has  been employed with [employer].  He quit smoking 15 years ago. He  denies any alcohol use.   REVIEW OF SYSTEMS:  Please see HPI for GI.  GENERAL:  Denies any weight  loss. CARDIOPULMONARY:  Denies any chest pain or shortness of breath.   PHYSICAL EXAMINATION:  VITAL SIGNS:  Weight 203, height 5 feet 10 inches,  temperature 97.1, blood pressure 118/78, pulse 76.  GENERAL:  A pleasant, well-nourished, well-developed, black gentleman in no  acute distress.  SKIN:  Warm and dry, no jaundice.  HEENT:  Conjunctivae pink. Sclerae nonicteric.  Oropharyngeal mucosa moist  and pink. No lesions, erythema, or exudate. No lymphadenopathy or  thyromegaly.  CHEST:  Lungs are clear to auscultation.   CARDIAC:  Regular rate and rhythm with normal S1 And S2.  No murmurs, rubs,  or gallops.  ABDOMEN:  Positive bowel sounds, soft, nontender, nondistended. No  organomegaly or masses.  EXTREMITIES:  No edema.   IMPRESSION:  1. The patient is a 65 year old gentleman with a six week history of     epigastric pain in the setting of positive Helicobacter pylori     serologies, on Celebrex. Upper gastrointestinal series was really not     helpful given that it was a markedly limited examination.  Given     symptoms, would recommend upper endoscopy to rule out  peptic ulcer     disease. In addition, however, he did have focal gallbladder wall     thickening with multiple gallbladder polyps. Cannot rule out biliary     etiology of his symptoms [name] this time. Liver function tests are normal.  2. History of colonic tubulovillous adenoma due for followup colonoscopy [name]     this time.  He has a family history of colorectal cancer as well.   PLAN:  1. Colonoscopy and EGD in the near future.  2. He will continue Carafate and Nexium for now.  3. Will plan to treat H. pylori after endoscopic evaluation.     ________________________________________  ___________________________________________  Tiffany Kocher, P.A.-C                   Lionel December, M.D.   LSL/MEDQ  D:  10/18/2003  T:  10/19/2003  Job:  [id number]   cc:   Corrie Mckusick, M.D.  [address] [place] [place]  [place]  [place] [address]  Fax: [phone]

## 2011-03-13 NOTE — Discharge Summary (Signed)
NAMEJABIN, [name] NO.:  [id number]   MEDICAL RECORD NO.:  [id number]          PATIENT TYPE:  INP   LOCATION:  2102                         FACILITY:  [facility]   PHYSICIAN:  [name], M.D.DATE OF BIRTH:  [birth year]   DATE OF ADMISSION:  03/21/2005  DATE OF DISCHARGE:                                 DISCHARGE SUMMARY   [name] is a 65 year old black male who has known amyloid  cardiomyopathy. He recently was at [facility], admission date Mar 12, 2005, discharge date Mar 20, 2005. He apparently went to the [facility]  emergency room on Mar 21, 2005, with altered mental status, low blood  pressure, and a temperature of 101 with pyuria and presumed urinary tract  sepsis. Because of his connection to [facility] their hospital was  called, but they were not able to accept the patient because of no beds  being available. Thus, he is accepted in transfer by Dr. Sandrea [name] to  [facility]. He was placed on IV dopamine, IV Cipro 400 mg q.4h., IV  hydrocortisone 100 mg q.8h., and some Lovenox for DVT prophylaxis. His blood  pressure continued to be low. He was given albumin approximately three IV  doses 500 cc over the next few days. A Swan-Ganz was inserted. Cardiology  consult was called on Mar 23, 2005, secondary to equal pressures and to rule  out tamponade. A 2-D echocardiogram was performed and was found to only have  a small pericardial effusion, dimensions 8 mm posterior and 3 mm left  lateral. He did have evidence of infiltrative pattern consistent with his  amyloid diagnosis and grade 3 diastolic dysfunction. He had a restrictive  physiology. The EF was 50% to 55%. He had moderate pulmonary hypertension  and his IVCD did not collapse with inspiration. He was noted to be in acute  renal failure. Apparently records from [facility] showed that his baseline is  approximately 2.5 for his creatinine and on admission his creatinine was  5.3  with BUN 137. A renal consult was called on Mar 24, 2005. His dopamine was  discontinued on Mar 23, 2005, and he was switched to IV dobutamine. His  urine culture came back positive for E. coli which was sensitive to  ciprofloxacin that had been ordered every 24 hours. His blood cultures have  been negative times four days now. He had a repeat urine culture done on Mar 25, 2005, which is still pending at this time.  On Mar 25, 2005, his urine  output had increased slightly. His creatinine was down marginally. BUN was  128 and creatinine was 4.7.  His potassium was also low on Mar 25, 2005, and  he was getting replacement. His blood pressures were running from 100 to 144  systolic and diastolic was 60 to 78. His O2 saturations were running 97% on  two liters. His Swan readings on Mar 23, 2005, at insertion showed a CVP of  22, PA pressure 33/21, and his cardiac output was 3.09.  At that point he  was started on  IV dobutamine and he has been __________ on IV Lasix  intermittently. On Mar 25, 2005, his last CVP was 28, PA pressures 41/30,  cardiac output 5.42, and his cardiac index was 2.78. His last wedge done on  about 11 a.m. on Mar 25, 2005, was 17. I&Os on Mar 25, 2005, at the time of  this dictation at 6:30 p.m. had 2263 in and 1995 out. On Mar 24, 2005, his  intake was 3577, output 1505, and on Mar 23, 2005, his input was 4022 with  output of 439. She has a total I&O balance since his admission of +10,000 cc  of fluid. [name], nephrologist, has contacted [facility] for  his transfer for continuity of care.  He will be accepted by cardiologist, I  believe [name]. Thus, he will be transferred when a bed is  available.   DISCHARGE MEDICATIONS:  At the time of this dictation are:  1. Protonix 40 mg daily.  2. Allopurinol 100 mg daily.  3. Hydrocortisone 100 mg q.8h.  4. Aricept 100 mcg on Tuesday, once a week.  5. Ferrous sulfate 325 mg t.i.d.  6.  Senokot one tablet at bedtime.  7. Cipro 400 mg IV q.24h.  8. IV dopamine at 4 mcg/kg.  9. Multiple doses of potassium at the time of this discharge with      potassium of 2.9. He is to get three p.o. doses of 40 mEq a piece      several years apart. Potassium will be rechecked in the a.m.     LABORATORY DATA:  On Mar 24, 2005, hemoglobin was 9.5, hematocrit 27.5, WBC  17.4, platelet count 153,000. On Mar 21, 2005, WBC 20, hemoglobin 10.6,  hematocrit 30.8. On Mar 25, 2005, at 4:03, potassium was 2.9, sodium 139,  chloride 105, CO2 20, glucose 132, BUN 128, creatinine 4.7.  SGOT 25, SGPT  27, albumin 3, total protein 5.6. BNP on Mar 21, 2005, was 722; on Mar 25, 2005, it was 2089.  Total iron 36, TIBC 197, percent saturation 1, vitamin  B12 1503. Folate greater than 20 and ferritin 704. Urine on Mar 21, 2005,  showed positive Escherichia coli greater than 100,000, sensitive to  ciprofloxacin.   A 2-D echocardiogram was performed on Mar 23, 2005. Chest x-ray on Mar 24, 2005, showed pulmonary vasculature within normal limits, Swan-Ganz stable,  small left pleural effusion. On Mar 23, 2005, renal ultrasound was negative  for hydronephrosis on the right, status post left nephrectomy.   DISCHARGE DIAGNOSES:  1. Acute decompensation with urinary tract infection and Escherichia coli      with shock with hypertension, improved now on dobutamine and fluids.  2. Acute on chronic renal insufficiency.  3. Congestive heart failure.  4. Cardiomyopathy.  5. History of left nephrectomy secondary to renal cell carcinoma.  6. Anemia.  7. Hypokalemia.  8. History of cholecystectomy.  9. History of inguinal hernia repair.  10.History of gout.  11.History of hypertension.        BB/MEDQ  D:  03/25/2005  T:  03/25/2005  Job:  [id number]

## 2011-03-13 NOTE — Op Note (Signed)
[name], [name]                 ACCOUNT NO.:  [id number]   MEDICAL RECORD NO.:  [id number]           PATIENT TYPE:   LOCATION:                                 FACILITY:   PHYSICIAN:  Quita Skye. Hart Rochester, M.D.       DATE OF BIRTH:   DATE OF PROCEDURE:  11/16/2005  DATE OF DISCHARGE:                                 OPERATIVE REPORT   PREOP DIAGNOSIS:  End-stage renal disease.   POSTOP DIAGNOSIS:  End-stage renal disease.   OPERATION:  1.  Creation of left upper arm brachial artery to cephalic vein AV fistula      (Kauffman shunt).  2.  Bilateral ultrasound localization internal jugular veins.  3.  Insertion Diatek catheter via right internal jugular vein (28 cm).   SURGEON:  Dr. Hart Rochester   FIRST ASSISTANT:  Nurse.   ANESTHESIA:  Local.   PROCEDURE:  The patient was taken to operating room, placed in the supine  position at which time the left upper extremity was prepped Betadine scrub  solution and draped routine sterile manner. After infiltration of 1%  Xylocaine with epinephrine, transverse incision was made in the antecubital  area antecubital vein dissected free. Cephalic branch was about 3 mm in  size, could easily be dilated up to the shoulder level, seemed adequate for  fistula. It was transected after ligating it distally. The brachial artery  was then dissected free and circled with Vesseloops.  It had an excellent  pulse. No heparin was administered. Artery was occluded proximally and  distally opened with a 15 blade, extended with Potts scissors. Cephalic vein  was slightly spatulated and anastomosed end-to-side with 6-0 Prolene.  Vesseloops then released. There was good pulse and thrill, excellent Doppler  flow in the fistula. Wound was closed in layers with Vicryl in subcuticular  fashion. Attention turned to the upper chest and neck which were exposed and  both internal jugular veins were imaged using B-mode ultrasound. Right was  larger than the left, both appeared  normal. After prepping and draped in  routine sterile manner, the right internal jugular vein was entered using a  supraclavicular approach. Guidewire passed into the right atrium under  fluoroscopic guidance. After dilating the tract appropriately, a 28-cm  Diatek catheter was passed through peel-away sheath positioned, tunneled  peripherally and secured with nylon sutures. Wound was closed with Vicryl in  a subcuticular fashion. Sterile dressing applied. The patient taken to  recovery in satisfactory condition.           ______________________________  Quita Skye Hart Rochester, M.D.     JDL/MEDQ  D:  11/16/2005  T:  11/16/2005  Job:  [id number]

## 2011-03-13 NOTE — Op Note (Signed)
[name], [name]                          ACCOUNT NO.:  [id number]   MEDICAL RECORD NO.:  [id number]                   PATIENT TYPE:  AMB   LOCATION:  DAY                                  FACILITY:  [facility]   PHYSICIAN:  Lionel December, M.D.                 DATE OF BIRTH:  05/23/1946   DATE OF PROCEDURE:  11/02/2003  DATE OF DISCHARGE:                                  PROCEDURE NOTE   PROCEDURE:  Esophagogastroduodenoscopy followed by total colonoscopy.   ENDOSCOPIST:  Lionel December, M.D.   INDICATIONS:  This patient is a 65 year old African-American male who has  been experiencing epigastric pain despite being on a PPI.  Upper GI series  was attempted. He could not swallow enough barium.  He did have GE reflux  and there was a question of abnormal gastric folds.  He has a history of  colonic polyps. He had a large tubular villous adenoma removed from his  distal sigmoid colon in September 2000 and is overdue for a surveillance  exam.  Pertinent history is positive for the fact that he had left  nephrectomy in August 2004 for renal cell carcinoma.   Procedure and risks were reviewed with the patient and informed consent was  obtained.   PREOPERATIVE MEDICATIONS:  Cetacaine spray for oropharyngeal topical  anesthesia, Demerol 50 mg IV and Versed 6 mg IV in divided dose.   FINDINGS:  Procedure performed in endoscopy suite.  The patient's vital  signs and O2 saturation were monitored during the procedure and remained  stable.   PROCEDURE #1: ESOPHAGOGASTRODUODENOSCOPY:  The patient was placed in the  left lateral recumbent position and Olympus videoscope was passed via the  oropharynx without any difficulty into the esophagus.   ESOPHAGUS:  Mucosa of the esophagus was normal.  Squamocolumnar junction was  unremarkable.  No ring or stricture was noted.   STOMACH:  It was empty and distended very well with insufflation.  The folds  in the proximal stomach appeared to be  unremarkable.  The mucosa at antrum  reveals some patchy erythema and granularity, but there were no erosions or  ulcers noted.  The pyloric channel was patent. The angularis, fundus, and  cardia were examined by retroflexing the scope and were normal.   DUODENUM:  Examination of the bulb and postbulbar duodenum was normal.   Endoscope was withdrawn and the patient prepared for procedure #2.   COLONOSCOPY:  Rectal examination was performed.  No abnormality noted on  external or digital exam.   Olympus videoscope was placed in the rectum and advanced under vision into  the sigmoid colon and beyond.  Preparation was excellent.  Multiple small-to-  medium size diverticula were noted primarily at the proximal transverse  colon and hepatic flexure.  Cecal landmarks were well seen. Pictures were  taken of the appendiceal orifice and ileocecal valve. As the scope was  withdrawn  the colonic mucosa was carefully examined.  There were no polyps  noted.  There was a large scar at the rectosigmoid junction or distal  sigmoid site of previous polypectomy.  The rectal mucosa was normal.   The scope was retroflexed to examine anorectal junction and small  hemorrhoids were noted below the dentate line. The endoscope was  straightened and withdrawn.  The patient tolerated the procedure well.   FINAL DIAGNOSES:  1. Nonerosive antral gastritis. His Helicobacter pylori is positive; and he,     therefore, will be treated.  2. No evidence of peptic ulcer disease.  3. Right-sided colonic diverticulosis.  No evidence of recurrent colonic     polyps.  4. Small external hemorrhoids.   RECOMMENDATIONS:  1. Prevpac for 2 weeks as directed.  I feel that he could stop his Carafate     and he will resume his Nexium once Prevpac is finished.  I have asked him     to use Celebrex only on a p.r.n. basis.  If he needs to take it daily,     maybe the dose could be dropped to 100 mg daily rather than 200 mg daily.   2. He will return for OV in 6 weeks.  If he remains with epigastric pain     would consider hepatobiliary scan, otherwise would do a follow up     gallbladder ultrasound in 6 months to make sure these polyps have not     increased in size.      ___________________________________________                                            Lionel December, M.D.   NR/MEDQ  D:  11/02/2003  T:  11/02/2003  Job:  [id number]   cc:   Corrie Mckusick, M.D.  [address] Laurell Josephs. A  [place]  [place] [address]  Fax: [phone]

## 2011-03-13 NOTE — H&P (Signed)
[name], [name]                ACCOUNT NO.:  [id number]   MEDICAL RECORD NO.:  [id number]          PATIENT TYPE:  INP   LOCATION:  2102                         FACILITY:  [facility]   PHYSICIAN:  Charlaine Dalton. Sherene Sires, M.D. [facility] OF BIRTH:  07-24-1946   DATE OF ADMISSION:  03/21/2005  DATE OF DISCHARGE:                                HISTORY & PHYSICAL   CHIEF COMPLAINT:  Altered mental status.   HISTORY OF PRESENT ILLNESS:  A 65 year old black male diagnosed with amyloid  by endobronchial biopsy in the summer of 2005, with most of his care  recently through the cardiology service at [facility]. He was admitted  there on Mar 12, 2005 complaining of scrotal and extremity swelling and  discharged on increasing doses of Lasix and Zaroxolyn with a Foley in place.  He has just been home for several days but having increasing symptoms of  lethargy and mental status changes per his wife and was taken to the  emergency room at [facility] where he was found to have a blood pressure of  60, and temperature of 101 with pyuria and presumed urinary tract sepsis and  transferred to [facility]. On arrival here he says his swelling and pain in his  scrotum is much better and he denies any chest pain, abdominal pain, nausea,  chills or sensation of dysuria (Foley in place). He also has no significant  leg swelling compared to baseline but has persistent blood pressures in the  80's on Dopamine. He has had no cough or sore throat, rigors, muscle aches.   PAST MEDICAL HISTORY:  1.  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 His last TEE showed ejection fraction 50%, January, 2006 with      severe biatrial enlargement and severe TR.  2.  Hypertension.  3.  Gout.  4.  Benign prostatic hypertrophy.  5.  Status post left nephrectomy renal cell carcinoma remotely.  6.  Status post cholecystectomy.  7.  Status post left inguinal herniorrhaphy.   SOCIAL HISTORY:  He quit  smoking years ago. He lives with his wife in  [place].   FAMILY HISTORY:  Positive for diabetes in his mother, Alzheimer's also in  his mother. Father unknown.   ALLERGIES:  None known.   MEDICATIONS:  1.  Furosemide 160 mg b.i.d.  2.  Kay-Lor 40 mEq b.i.d.  3.  Allopurinol 100 mg daily.  4.  Nexium 40 mg daily.  5.  Enalapril 5 mg bid  6.  Toprol 50 mg daily.  7.  Terazosin 5 mg q.h.s.  8.  Aspirin 81 mg daily.  9.  Decadron 40 mg daily.  10. Clarinex 5 mg daily.  11. Detrol LA 4 mg daily.  12. He has morphine sulfate to take 30 mg p.r.n. but apparently has not been      taking it because he is pain is less, per his wife.   PHYSICAL EXAMINATION:  GENERAL:  This is an elderly black male who appears  older than his stated age. Lets his wife do  most of his talking. Answers  questions slowly and indirectly if at all. However he is oriented to person  and place.  VITAL SIGNS:  Temperature here is 100.8, blood pressure is 80/50 with a  pulse rate of approximately 90. Saturations are 95% on 2 liters.  HEENT:  Oropharynx is clear. Dentition is intact.  NECK:  Supple without cervical adenopathy or tenderness. Trache is midline.  LUNGS: Lung fields reveal no significant crackles on inspiration and no  wheeze on expiration.  HEART:  Regular rate and rhythm with a soft S4, no S3.  ABDOMEN:  Soft, benign with no palpable organomegaly, masses, ascites, or  tenderness.  SCROTUM:  Is moderately swollen; Foley is in place.  EXTREMITIES:  Reveal trace edema only, with decreased pulses.  NEUROLOGIC:  No focal deficits. Pathologic reflexes.  SKIN EXAM:  Warm and dry.   Chest x-ray shows mild cardiomegaly with no infiltrates or infusions.   LABORATORY DATA:  Significant for a BUN of 137, creatinine of 5.3 with a  potassium of 5.4. Hematocrit is 31%, WBC is 20,000. Urine and blood cultures  are pending. Urinalysis reveals positive wbc's but negative leukocyte  esterase and nitrites.  Albumin is 2.7.   IMPRESSION:  1.  Shock associated with fever in a patient with amyloidosis therefore      presumably limited cardiac output who appears markedly volume depleted.      The records indicate that he has been chronically on ACE inhibitors but      at this point they will need to be stopped and he will be re-hydrated to      the point where he has a tolerable amount of edema to restore perfusion      to the single kidney that he has remaining (status post left nephrectomy      remotely). If his urine output does not improve with volume he will need      central line placement to determine CVP.   1.  Pyuria, possibly sepsis. He was already treated with Cipro at [facility], will continue this here pending culture results.   1.  Hypertension. For now will hold his hypertensives and add back Toprol as      the first drug to help improve diastolic function, with cardiology input      if needed.      MBW/MEDQ  D:  03/21/2005  T:  03/21/2005  Job:  [id number]   cc:   Kirk Ruths, M.D.  [address]  [place]  [place] [address]  Fax: [phone]

## 2011-03-13 NOTE — Discharge Summary (Signed)
[name], [name]                ACCOUNT NO.:  [id number]   MEDICAL RECORD NO.:  [id number]          PATIENT TYPE:  INP   LOCATION:  A219                          FACILITY:  [facility]   PHYSICIAN:  Kirk Ruths, M.D.DATE OF BIRTH:  [birth year]   DATE OF ADMISSION:  11/30/2004  DATE OF DISCHARGE:  02/09/2006LH                                 DISCHARGE SUMMARY   DISCHARGE DIAGNOSES:  1.  Syncope.  2.  Cardiomyopathy.  3.  Renal insufficiency.  4.  History of amyloidosis.  5.  History of status post nephrectomy secondary to carcinoma.  6.  History of hypertension.  7.  History of gout.   HOSPITAL COURSE:  This 65 year old male, who has been followed actively for  cardiomyopathy and later to amyloidosis as well as his amyloidosis itself,  had a syncopal episode the evening of admission.  The patient was uninjured,  but evaluation in the emergency room showed a potassium of 2.6 and a  troponin slightly elevated.  The patient had recently undergone cardiac  catheterization which showed nonobstructive coronary artery disease and  recently seen by Dr. Maryln Gottron at __________ for his cardiomyopathy related  to his amyloidosis.  His Lasix was recently increased to 120 mg, and it was  felt his syncope could be related to low potassium, and he was admitted for  further evaluation.  The patient's was 11.8 on admission and remained stable  through his stay.  The patient's sodium was 134 on admission.  BUN and  creatinine were 27 and 2.0.  The patient had a uric acid of 8.5.  The  patient was seen in consultation by [facility] and during his  stay, was found to have short runs of V-tac, 6-11 beats at different times,  although he was asymptomatic.  The feeling was this could possibly be  related to his syncope which was his second episode.  Also during his stay,  his creatinine began climbing, went to 2.4.  Urine study was started, and he  was seen in consultation by Dr.  Kristian Covey for nephrology.  The patient's 24  hour urine results showed a creatinine clearance of 37 mL/m which was  approximately half lower limits of normal.  The patient's diuretics were  adjusted, and his creatinine improved to 2.2 where it stabilized, and he  also underwent a protein electrophoresis study which was equivocal, showing  gammaglobulin was slightly elevated at 19.7 and went to 18.8.  The patient  reached maximum hospital benefit stable, off of IV fluids, and he is  currently maintained on 80 mg of Lasix a day and was told to adjust his  diuretic by his weight.   DISCHARGE MEDICATIONS:  1.  Allopurinol.  2.  Toprol 50.  3.  Hytrin 5.  4.  Aspirin.  5.  Protonix.  6.  K-Dur 20 mEq t.i.d.  7.  Lasix 80 mg daily.   He will be followed by Dr. Kristian Covey __________ cardiology as well as Dr.  Maryln Gottron at [facility].      WMM/MEDQ  D:  12/04/2004  T:  12/04/2004  Job:  [id number]   cc:   Jorja Loa, M.D.  [address]  [place]  [place] [address]  Fax: [phone]   Maryln Gottron, MD  [facility].

## 2011-03-13 NOTE — H&P (Signed)
NAMESANDRA, TELLEFSEN                ACCOUNT NO.:  [id number]   MEDICAL RECORD NO.:  [id number]          PATIENT TYPE:  INP   LOCATION:  A219                          FACILITY:  [facility]   PHYSICIAN:  Kirk Ruths, M.D.DATE OF BIRTH:  Feb 28, 1946   DATE OF ADMISSION:  11/30/2004  DATE OF DISCHARGE:  LH                                HISTORY & PHYSICAL   CHIEF COMPLAINT:  Passed out.   HISTORY OF PRESENT ILLNESS:  This is a 65 year old male who had a syncopal  episode the evening of admission.  The patient has a history of  cardiomyopathy related to amyloidosis.  He stated that the evening of  admission he had taken his diuretics at approximately 6 p.m.  At  approximately 10 p.m. had a pina-colada, probably a hour later went to  urinate and go to bed.  After urination he had a short syncopal episode  while leaving the bathroom.  In the emergency room, he was found to have a  slightly elevated troponin, but had a potassium of 2.6.  The patient  underwent cardiac catheterization approximately six weeks ago which showed  non-obstructive coronary artery disease.  He had been diagnosed subsequently  with amyloidosis for which he has been seen at [facility].  Recently,  approximately two weeks ago, his Lasix was increased at [facility] to 120  mg, and he was on 10 mEq of potassium.  The patient denies any recent  nausea, vomiting, or diarrhea, that would explain his low potassium.  Anyway, he is admitted for a syncopal episode rule out arrythmia possibly  associated with amyloidosis.   ALLERGIES:  No known drug allergies.   MEDICATIONS:  1.  Lasix 120 mg daily.  2.  K-Dur 10 mEq.  3.  Allopurinol 100 mg.  4.  Nexium 40 mg daily.  5.  Toprol 50 mg daily.  6.  ____________ 5 mg h.s.  7.  Aspirin 81 mg daily.   PAST MEDICAL HISTORY:  1.  In addition to the above mentioned cardiomyopathy and amyloidosis, he is      status post nephrectomy secondary to CA, and has a history of renal      insufficiency.  2.  Hypertension.  3.  GERD.   REVIEW OF SYSTEMS:  Denies chest pain, shortness of breath.   SOCIAL HISTORY:  Denies alcohol, denies smoking or drug abuse.  Does have an  occasional alcoholic drink.   PHYSICAL EXAMINATION:  GENERAL:  A well-developed, well-nourished male in no  acute distress.  VITAL SIGNS:  He is afebrile.  His pulse is 80 and regular, respirations are  20 and unlabored, his blood pressure is 110/80.  HEENT:  TM's are normal.  Pupils equal, round, reactive to light and  accommodation.  Oropharynx benign.  NECK:  Supple without JVD, bruit, or thyromegaly.  LUNGS:  Clear in all areas.  HEART:  Normal sinus rhythm without murmurs, rubs, or gallops.  ABDOMEN:  Soft, nontender.  EXTREMITIES:  No cyanosis, clubbing, or edema.  NEUROLOGIC:  Grossly intact.   ASSESSMENT:  1.  Syncope.  2.  Cardiomyopathy.  3.  History of amyloidosis.  4.  Status post nephrectomy secondary to carcinoma.  5.  Chronic renal insufficiency.  6.  History of gout.  7.  History of hypertension.      WMM/MEDQ  D:  11/30/2004  T:  11/30/2004  Job:  [id number]

## 2011-03-13 NOTE — Procedures (Signed)
NAMEDAELAN, GATT                ACCOUNT NO.:  [id number]   MEDICAL RECORD NO.:  [id number]          PATIENT TYPE:  INP   LOCATION:  A219                          FACILITY:  [facility]   PHYSICIAN:  Edward L. Juanetta Gosling, M.D.DATE OF BIRTH:  1946/01/24   DATE OF PROCEDURE:  11/30/2004  DATE OF DISCHARGE:                                EKG INTERPRETATION   RESULTS:  The rhythm is sinus rhythm with a rate in the 70s.  There is first  degree AV block.  There is generally low voltage.  Q-waves are seen in V1,  2, 3, and 4, suggestive of a previous anterior infarction.  There is right  axis deviation.  Abnormal electrocardiogram.      ELH/MEDQ  D:  11/30/2004  T:  11/30/2004  Job:  [id number]

## 2011-03-13 NOTE — Consult Note (Signed)
[name], [name]                ACCOUNT NO.:  [id number]   MEDICAL RECORD NO.:  [id number]          PATIENT TYPE:  INP   LOCATION:  A219                          FACILITY:  [facility]   PHYSICIAN:  Jorja Loa, M.D.DATE OF BIRTH:  30-Nov-1945   DATE OF CONSULTATION:  11/30/2004  DATE OF DISCHARGE:                                   CONSULTATION   REASON FOR CONSULTATION:  Worsening of renal failure.   [name] is a 65 year old African-American with past medical history of  hypertension, history of renal CA, status post left nephrectomy, history of  gout, history of also recurrent CHF, presently came with the complaints of  dizziness and lightheadedness.  When he was admitted into the hospital, he  was felt to have elevated BUN and creatinine and a consult is called.  According to the patient, since he has had a nephrectomy, he was told to  have some renal failure and also some edema.  Because of that, the patient  was put on diuretics with some improvement.  Recently, since he was  retaining lots of fluid, the patient was put on Lasix 80 mg p.o. b.i.d.  He  lost some fluid, but he started having dizziness.  Thus, he ended up coming  to the hospital.  At this moment there is a documentation of chronic renal  failure; however, I am not sure what his baseline was.  From his previous  admission in November, his creatinine was 1.6.  Probably that may be his  baseline.  Presently the patient feels better, no shortness of breath, no  dizziness, no lightheadedness.   PAST MEDICAL HISTORY:  1.  History of GERD.  2.  History of hypertension.  3.  History of coronary artery disease.  4.  History of gout.  5.  History of renal CA, status post nephrectomy.  6.  History of colonic diverticulosis.  7.  He has also LVH and with some restrictive heart disease, status post      biopsy.  8.  Possibly also mild renal failure.   PAST SURGICAL HISTORY:  1.  As stated above, the patient is status  post nephrectomy.  2.  He has also previous liver biopsy.  3.  Removal of polyps.   ALLERGIES:  No known allergies.   MEDICATIONS:  1.  Allopurinol 100 mg p.o. daily.  2.  Aspirin 81 mg p.o. daily.  3.  Toprol 150 mg p.o. daily.  4.  Protonix 80 mg p.o. daily.  5.  K-Dur 20 mEq p.o. t.i.d., which was started today.  6.  Terazosin, Hytrin, 5 mg p.o. daily.   SOCIAL HISTORY:  He is an ex-smoker, quit smoking about 15 years ago.  He  used to work for a tobacco company.   REVIEW OF SYSTEMS:  No shortness of breath, no dizziness, no  lightheadedness.  The only problem that he has is that he has swelling of  his abdomen, which is probably from ascites, and swelling of the legs, which  has abated at this moment.  Also, he has a previous history of kidney  stones.   PHYSICAL EXAMINATION:  VITAL SIGNS:  His temperature is 98, pulse of 86,  blood pressure 122/72.  HEENT:  No conjunctival pallor.  Muddy sclerae.  Oral mucosa seems to be  dry.  NECK:  Supple, no JVD, no bruits.  CHEST:  Decreased breath sounds, otherwise no rales, no rhonchi, no  egophony.  CARDIAC:  Regular rate and rhythm, no murmur.  ABDOMEN:  Soft, positive bowel sounds.  EXTREMITIES:  He has trace edema.   His blood work:  White blood cell count is 4, hemoglobin 11.8, hematocrit  32.8.  Sodium 134, potassium 2.8, his creatinine in November 2005 was 1.6  and presently his creatinine when he came in December was 1.7, increased to  2.  Alkaline phosphatase is 178, SGOT 78, previously his albumin was 3.3,  presently 3.5, and he had a previous protein of 300 mg/dl in November.  He  had also an ultrasound which was done in February, which showed a left  nephrectomy, right kidney 15 cm.   ASSESSMENT:  1.  Renal insufficiency at this moment seems to be chronic.  The etiology      for his renal insufficiency is not clear; however, he has only one      kidney and multiple etiologies such as hypertension; however other       etiologies which will give him proteinuria and renal insufficiency      cannot be ruled out.  2.  Proteinuria, at this moment seems to be in nephrotic range.  Since the      patient has one kidney, which seems to be hypertrophic, at this moment      focal segmental nephrosclerosis may be the etiology for his proteinuria.      However, hypertensive nephrosclerosis and other etiologies still need to      be considered.  3.  History of renal carcinoma, status post left nephrectomy.  4.  Hypertension.  Blood pressure seems to be controlled very well.  5.  History of gout.  6.  History of recurrent congestive heart failure, the patient with left      ventricular hypertrophy and also restrictive component.  He had biopsy,      which was negative for amyloidosis.  7.  Anasarca, probably multifactorial, including uncontrolled salt and fluid      intake plus history of renal failure and also possible cardiac problem.  8.  History of hypokalemia, possibly because of the diuretics he was given.   RECOMMENDATIONS:  I agree with replacement of his potassium.  Probably would  decrease it to 40 mEq p.o. t.i.d. today and will give him also some in IV,  and will do 24-hour urine for protein and electrophoresis and also  creatinine clearance.  At this moment since he has previous studies for  ultrasound, we do not need to do any ultrasound.  Will continue his other  medications and will follow the patient.    BB/MEDQ  D:  11/30/2004  T:  12/01/2004  Job:  [id number]

## 2011-07-16 ENCOUNTER — Ambulatory Visit: Payer: Self-pay | Admitting: Vascular Surgery

## 2011-07-16 LAB — POTASSIUM: Potassium: 5.5 — ABNORMAL HIGH

## 2011-07-23 LAB — POTASSIUM: Potassium: 4.6

## 2011-07-28 LAB — BASIC METABOLIC PANEL
Calcium: 9.3
Creatinine, Ser: 7.99 — ABNORMAL HIGH
GFR calc Af Amer: 8 — ABNORMAL LOW

## 2011-07-28 LAB — PROTIME-INR
INR: 1.1
Prothrombin Time: 14.7

## 2011-07-28 LAB — CBC
RBC: 3.1 — ABNORMAL LOW
WBC: 5.9

## 2011-07-30 ENCOUNTER — Ambulatory Visit: Payer: Self-pay | Admitting: Vascular Surgery

## 2011-07-30 DIAGNOSIS — I10 Essential (primary) hypertension: Secondary | ICD-10-CM

## 2011-08-05 LAB — COMPREHENSIVE METABOLIC PANEL
AST: 47 — ABNORMAL HIGH
Albumin: 4
BUN: 29 — ABNORMAL HIGH
Calcium: 9
Chloride: 100
Creatinine, Ser: 7.24 — ABNORMAL HIGH
GFR calc Af Amer: 9 — ABNORMAL LOW
Total Bilirubin: 1

## 2011-08-05 LAB — CBC
Hemoglobin: 12.7 — ABNORMAL LOW
Platelets: 221
RBC: 3.65 — ABNORMAL LOW

## 2011-08-05 LAB — APTT: aPTT: 43 — ABNORMAL HIGH

## 2011-08-05 LAB — PROTIME-INR: INR: 1.1

## 2011-08-06 LAB — POCT I-STAT 4, (NA,K, GLUC, HGB,HCT)
Glucose, Bld: 78
HCT: 44
Hemoglobin: 15
Operator id: 206361
Operator id: 274481
Potassium: 5.1
Sodium: 134 — ABNORMAL LOW

## 2011-08-12 ENCOUNTER — Ambulatory Visit: Payer: Self-pay | Admitting: Vascular Surgery

## 2011-09-09 ENCOUNTER — Ambulatory Visit: Payer: Self-pay | Admitting: Vascular Surgery

## 2011-09-22 ENCOUNTER — Ambulatory Visit: Payer: Self-pay | Admitting: Vascular Surgery

## 2011-09-30 ENCOUNTER — Ambulatory Visit: Payer: Self-pay | Admitting: Vascular Surgery

## 2011-10-10 ENCOUNTER — Emergency Department (HOSPITAL_COMMUNITY)
Admission: EM | Admit: 2011-10-10 | Discharge: 2011-10-10 | Discharge status: Against Medical Advice | Payer: Medicare Other | Attending: Emergency Medicine | Admitting: Emergency Medicine

## 2011-10-10 ENCOUNTER — Emergency Department (HOSPITAL_COMMUNITY): Payer: Medicare Other

## 2011-10-10 ENCOUNTER — Encounter: Payer: Self-pay | Admitting: Emergency Medicine

## 2011-10-10 DIAGNOSIS — R059 Cough, unspecified: Secondary | ICD-10-CM | POA: Insufficient documentation

## 2011-10-10 DIAGNOSIS — Z905 Acquired absence of kidney: Secondary | ICD-10-CM | POA: Insufficient documentation

## 2011-10-10 DIAGNOSIS — J3489 Other specified disorders of nose and nasal sinuses: Secondary | ICD-10-CM | POA: Insufficient documentation

## 2011-10-10 DIAGNOSIS — Z992 Dependence on renal dialysis: Secondary | ICD-10-CM | POA: Insufficient documentation

## 2011-10-10 DIAGNOSIS — R070 Pain in throat: Secondary | ICD-10-CM | POA: Insufficient documentation

## 2011-10-10 DIAGNOSIS — J069 Acute upper respiratory infection, unspecified: Secondary | ICD-10-CM

## 2011-10-10 DIAGNOSIS — Z87891 Personal history of nicotine dependence: Secondary | ICD-10-CM | POA: Insufficient documentation

## 2011-10-10 DIAGNOSIS — E639 Nutritional deficiency, unspecified: Secondary | ICD-10-CM | POA: Insufficient documentation

## 2011-10-10 DIAGNOSIS — I959 Hypotension, unspecified: Secondary | ICD-10-CM | POA: Insufficient documentation

## 2011-10-10 DIAGNOSIS — E8589 Other amyloidosis: Secondary | ICD-10-CM | POA: Insufficient documentation

## 2011-10-10 DIAGNOSIS — R05 Cough: Secondary | ICD-10-CM | POA: Insufficient documentation

## 2011-10-10 DIAGNOSIS — N186 End stage renal disease: Secondary | ICD-10-CM | POA: Insufficient documentation

## 2011-10-10 HISTORY — DX: Dependence on renal dialysis: N18.6

## 2011-10-10 HISTORY — DX: Organ-limited amyloidosis: E85.4

## 2011-10-10 HISTORY — DX: Benign prostatic hyperplasia without lower urinary tract symptoms: N40.0

## 2011-10-10 HISTORY — DX: Organ-limited amyloidosis: I43

## 2011-10-10 HISTORY — DX: End stage renal disease: Z99.2

## 2011-10-10 NOTE — ED Notes (Signed)
Pt c/o productive cough with nasal congestion x 2 days.

## 2011-10-10 NOTE — ED Provider Notes (Signed)
This chart was scribed for Laray Anger, DO by Wallis Mart. The patient was seen in room APA11/APA11 and the patient's care was started at 11:47 AM.   CSN: [id number] Arrival date & time: 10/10/2011 10:35 AM   Chief Complaint  Patient presents with  . Cough  . Nasal Congestion    HPI Pt was seen at 1145. [name] is a 65 y.o. male who presents to the Emergency Department complaining of gradual onset and persistence of constant cough, runny/stuffy nose, sinus congestion and sore throat x2 days.  Denies fevers, no rash, no CP/SOB, no abd pain, no N/V/D.      Past Medical History  Diagnosis Date  . Renal disorder   . Hypotension   . Amyloid heart disease   . Cardiomyopathy   . Gout   . BPH (benign prostatic hypertrophy)   . ESRD (end stage renal disease) on dialysis     Past Surgical History  Procedure Date  . Cholecystectomy   . Hernia repair   . Av fistula placement   . Nephrectomy     left     History  Substance Use Topics  . Smoking status: Former Smoker    Quit date: 09/25/1981  . Smokeless tobacco: Not on file  . Alcohol Use: No    Review of Systems ROS: Statement: All systems negative except as marked or noted in the HPI; Constitutional: Negative for fever and +chills, generalized body aches/fatigue. ; ; Eyes: Negative for eye pain, redness and discharge. ; ; ENMT: Negative for ear pain, hoarseness, +rhinorrhea, nasal congestion, sinus pressure and sore throat. ; ; Cardiovascular: Negative for chest pain, palpitations, diaphoresis, dyspnea and peripheral edema. ; ; Respiratory: +cough. Negative for wheezing and stridor. ; ; Gastrointestinal: Negative for nausea, vomiting, diarrhea, abdominal pain, blood in stool, hematemesis, jaundice and rectal bleeding. . ; ; Genitourinary: Negative for dysuria, flank pain and hematuria. ; ; Musculoskeletal: Negative for back pain and neck pain. Negative for swelling and trauma.; ; Skin: Negative for pruritus,  rash, abrasions, blisters, bruising and skin lesion.; ; Neuro: Negative for headache, lightheadedness and neck stiffness. Negative for weakness, altered level of consciousness , altered mental status, extremity weakness, paresthesias, involuntary movement, seizure and syncope.       Allergies  Review of patient's allergies indicates no known allergies.  Home Medications  No current outpatient prescriptions on file.  BP 77/50  Pulse 87  Temp(Src) 98.4 F (36.9 C) (Oral)  Resp 18  Ht 5\' 8"  (1.727 m)  Wt 165 lb (74.844 kg)  BMI 25.09 kg/m2  SpO2 100%  Physical Exam 1150: Physical examination:  Nursing notes reviewed; Vital signs and O2 SAT reviewed;  Constitutional: Well developed, Well nourished, Well hydrated, In no acute distress, sitting in chair in the exam room; Head:  Normocephalic, atraumatic; Eyes: EOMI, PERRL, No scleral icterus; ENMT: TM's clear bilat.  +edemetous nasal turbinates bilat with clear rhinorrhea. Mouth and pharynx normal, Mucous membranes moist; Neck: Supple, Full range of motion, No lymphadenopathy; Cardiovascular: Regular rate and rhythm, No murmur, rub, or gallop; Respiratory: Breath sounds clear & equal bilaterally, No rales, rhonchi, wheezes, or rub, Normal respiratory effort/excursion; Chest: Nontender, Movement normal; Abdomen: Soft, Nontender, Nondistended, Normal bowel sounds; Extremities: Pulses normal, No tenderness, No edema, No calf edema or asymmetry.; Neuro: AA&Ox3, Major CN grossly intact. Speech clear, gait steady. No gross focal motor or sensory deficits in extremities.; Skin: Color normal, Warm, Dry, no rash.    ED Course  Procedures  MDM  MDM Reviewed: nursing note, vitals and previous chart     Pt has hx of low SBP's in 90's/100's upon review of EPIC/Echart records.  After my exam, pt stated he "wants a shot of abx and pain medicines then a prescription."  Informed I would check a CXR to see if he had pneumonia, but that his symptoms  seemed to be consistent with viral syndrome; which would not be an indication for abx.  Pt stated to me that "this was a waste of time" and, per ED RN, was seen leaving.        I personally performed the services described in this documentation, which was scribed in my presence. The recorded information has been reviewed and considered.  Rozell Kettlewell Allison Quarry, DO 10/11/11 [phone]

## 2011-10-10 NOTE — ED Notes (Signed)
Pt states came here "so I could get a shot and some antibiotics to get rid of my cold".  Pt reports cold like symptoms started 2 days ago. Denies fever/ chills, diarrhea, N/V and shortness of breath.  Pt has history of hypotension, "thinks he takes hypotension medication and also is a dialysis pt. No distress noted.

## 2011-10-10 NOTE — ED Notes (Signed)
Pt left AMA.  Pt did not believe he needed a chest xray and is upset that he did not receive a pain shot and antibiotics.  Pt walked out stating he would see his own DR

## 2011-10-11 ENCOUNTER — Encounter (HOSPITAL_COMMUNITY): Payer: Self-pay | Admitting: Emergency Medicine

## 2011-10-23 ENCOUNTER — Ambulatory Visit: Payer: Self-pay | Admitting: Vascular Surgery

## 2011-10-26 ENCOUNTER — Ambulatory Visit: Payer: Self-pay | Admitting: Vascular Surgery

## 2011-11-16 ENCOUNTER — Ambulatory Visit (HOSPITAL_COMMUNITY)
Admission: RE | Admit: 2011-11-16 | Discharge: 2011-11-16 | Disposition: A | Discharge status: Home / Self Care | Payer: Medicare Other | Source: Ambulatory Visit | Attending: Nephrology | Admitting: Nephrology

## 2011-11-16 ENCOUNTER — Other Ambulatory Visit (HOSPITAL_COMMUNITY): Payer: Self-pay | Admitting: Nephrology

## 2011-11-16 DIAGNOSIS — R7611 Nonspecific reaction to tuberculin skin test without active tuberculosis: Secondary | ICD-10-CM

## 2012-07-14 ENCOUNTER — Other Ambulatory Visit (INDEPENDENT_AMBULATORY_CARE_PROVIDER_SITE_OTHER): Payer: Self-pay | Admitting: *Deleted

## 2012-07-14 ENCOUNTER — Encounter (INDEPENDENT_AMBULATORY_CARE_PROVIDER_SITE_OTHER): Payer: Self-pay | Admitting: Internal Medicine

## 2012-07-14 ENCOUNTER — Ambulatory Visit (INDEPENDENT_AMBULATORY_CARE_PROVIDER_SITE_OTHER): Payer: Medicare Other | Admitting: Internal Medicine

## 2012-07-14 ENCOUNTER — Telehealth (INDEPENDENT_AMBULATORY_CARE_PROVIDER_SITE_OTHER): Payer: Self-pay | Admitting: *Deleted

## 2012-07-14 VITALS — BP 62/40 | HR 88 | Temp 98.1°F | Ht 68.0 in | Wt 157.0 lb

## 2012-07-14 DIAGNOSIS — H33049 Retinal detachment with retinal dialysis, unspecified eye: Secondary | ICD-10-CM | POA: Insufficient documentation

## 2012-07-14 DIAGNOSIS — K635 Polyp of colon: Secondary | ICD-10-CM | POA: Insufficient documentation

## 2012-07-14 DIAGNOSIS — Z1211 Encounter for screening for malignant neoplasm of colon: Secondary | ICD-10-CM

## 2012-07-14 DIAGNOSIS — I959 Hypotension, unspecified: Secondary | ICD-10-CM | POA: Insufficient documentation

## 2012-07-14 DIAGNOSIS — K625 Hemorrhage of anus and rectum: Secondary | ICD-10-CM

## 2012-07-14 DIAGNOSIS — N186 End stage renal disease: Secondary | ICD-10-CM | POA: Insufficient documentation

## 2012-07-14 DIAGNOSIS — D126 Benign neoplasm of colon, unspecified: Secondary | ICD-10-CM

## 2012-07-14 MED ORDER — PEG-KCL-NACL-NASULF-NA ASC-C 100 G PO SOLR: 1.0000 | Freq: Once | ORAL | Status: DC

## 2012-07-14 NOTE — Patient Instructions (Addendum)
Colo0noscopy with Dr. Karilyn Cota.  The risks and benefits such as perforation, bleeding, and infection were reviewed with the patient and is agreeable.

## 2012-07-14 NOTE — Progress Notes (Signed)
Subjective:     Patient ID: [name], male   DOB: 1945/11/14, 66 y.o.   MRN: [id number]  HPIReferred by Dr. Sherwood Gambler for rectal bleeding. He has had rectal bleeding x 2-3 months. Describes as bright red. He see the blood daily. See blood on the stool and toilet tissue. Stools are dark brown. He usually has a BM once a day. Normal caliber.  No change in his BMs.  Appetite is good.  He has had weight loss since being on dialysis.  He has been on dialysis x 4 yrs. Family hx of colon cancer. Colonoscopy in 2005 Dr. Karilyn Cota.  (INDICATIONS: This patient is a 66 year old African-American male who has  been experiencing epigastric pain despite being on a PPI. Upper GI series  was attempted. He could not swallow enough barium. He did have GE reflux  and there was a question of abnormal gastric folds. He has a history of  colonic polyps. He had a large tubular villous adenoma removed from his  distal sigmoid colon in September 2000 and is overdue for a surveillance  exam. Pertinent history is positive for the fact that he had left  nephrectomy in August 2004 for renal cell carcinoma. Mother age 10). Last colonoscopy 2005:   Review of Systems see hpi     Current Outpatient Prescriptions  Medication Sig Dispense Refill  . allopurinol (ZYLOPRIM) 100 MG tablet Take 100 mg by mouth daily.      Marland Kitchen aspirin 81 MG tablet Take 81 mg by mouth daily.      . cinacalcet (SENSIPAR) 30 MG tablet Take 30 mg by mouth.      . midodrine (PROAMATINE) 10 MG tablet Take 10 mg by mouth 2 (two) times daily before a meal.      . multivitamin (RENA-VIT) TABS tablet Take 1 tablet by mouth daily.      Marland Kitchen omeprazole (PRILOSEC) 20 MG capsule Take 20 mg by mouth 2 (two) times daily before a meal.      . sevelamer (RENVELA) 800 MG tablet Take 800 mg by mouth 3 (three) times daily with meals.      . venlafaxine (EFFEXOR) 75 MG tablet Take 75 mg by mouth 2 (two) times daily.       Past Medical History  Diagnosis Date  . Renal  disorder   . Hypotension   . Amyloid heart disease   . Cardiomyopathy   . Gout   . BPH (benign prostatic hypertrophy)   . ESRD (end stage renal disease) on dialysis    Past Surgical History  Procedure Date  . Cholecystectomy   . Hernia repair   . Av fistula placement   . Nephrectomy     left   Family Status  Relation Status Death Age  . Mother Deceased     Dementia, Hx of colon cancer at age 3  . Father Other     unknown   History   Social History  . Marital Status: Married    Spouse Name: N/A    Number of Children: N/A  . Years of Education: N/A   Occupational History  . Not on file.   Social History Main Topics  . Smoking status: Former Smoker    Quit date: 09/25/1981  . Smokeless tobacco: Not on file  . Alcohol Use: No  . Drug Use: No  . Sexually Active:    Other Topics Concern  . Not on file   Social History Narrative  . No narrative on  file   No Known Allergies  Objective:   Physical Exam Filed Vitals:   07/14/12 0940  BP: 62/40  Pulse: 88  Temp: 98.1 F (36.7 C)  Height: 5\' 8"  (1.727 m)  Weight: 157 lb (71.215 kg)  Alert and oriented. Skin warm and dry. Oral mucosa is moist.   . Sclera anicteric, conjunctivae is pink. Thyroid not enlarged. No cervical lymphadenopathy. Lungs clear. Heart regular rate and rhythm.  Abdomen is soft. Bowel sounds are positive. No hepatomegaly. No abdominal masses felt. No tenderness.  No edema to lower extremities. No stool, guaiac positive.      Assessment:    Rectal bleeding. Colonic neoplasm needs to be ruled. Colonnic polyp also in the differential.    Plan:    Colonoscopy with Dr Karilyn Cota.Alert and oriented. Skin warm and dry. Oral mucosa is moist.   .

## 2012-07-14 NOTE — Telephone Encounter (Signed)
Patient needs movi prep 

## 2012-07-15 ENCOUNTER — Encounter (HOSPITAL_COMMUNITY): Payer: Self-pay | Admitting: Pharmacy Technician

## 2012-07-19 ENCOUNTER — Encounter (HOSPITAL_COMMUNITY)
Admission: RE | Disposition: A | Discharge status: Home / Self Care | Payer: Self-pay | Source: Ambulatory Visit | Attending: Internal Medicine

## 2012-07-19 ENCOUNTER — Ambulatory Visit (HOSPITAL_COMMUNITY)
Admission: RE | Admit: 2012-07-19 | Discharge: 2012-07-19 | Disposition: A | Discharge status: Home / Self Care | Payer: Medicare Other | Source: Ambulatory Visit | Attending: Internal Medicine | Admitting: Internal Medicine

## 2012-07-19 ENCOUNTER — Encounter (HOSPITAL_COMMUNITY): Payer: Self-pay | Admitting: *Deleted

## 2012-07-19 DIAGNOSIS — D126 Benign neoplasm of colon, unspecified: Secondary | ICD-10-CM | POA: Insufficient documentation

## 2012-07-19 DIAGNOSIS — Z860100 Personal history of colon polyps, unspecified: Secondary | ICD-10-CM

## 2012-07-19 DIAGNOSIS — D128 Benign neoplasm of rectum: Secondary | ICD-10-CM

## 2012-07-19 DIAGNOSIS — K5730 Diverticulosis of large intestine without perforation or abscess without bleeding: Secondary | ICD-10-CM | POA: Insufficient documentation

## 2012-07-19 DIAGNOSIS — D129 Benign neoplasm of anus and anal canal: Secondary | ICD-10-CM

## 2012-07-19 DIAGNOSIS — K625 Hemorrhage of anus and rectum: Secondary | ICD-10-CM

## 2012-07-19 DIAGNOSIS — K921 Melena: Secondary | ICD-10-CM | POA: Insufficient documentation

## 2012-07-19 HISTORY — PX: COLONOSCOPY: SHX5424

## 2012-07-19 SURGERY — COLONOSCOPY
Anesthesia: Moderate Sedation

## 2012-07-19 MED ORDER — SODIUM CHLORIDE 0.45 % IV SOLN: INTRAVENOUS | Status: DC

## 2012-07-19 MED ORDER — SIMETHICONE 40 MG/0.6ML PO SUSP
ORAL | Status: DC | PRN
  Administered 2012-07-19: 07:00:00

## 2012-07-19 MED ORDER — SODIUM CHLORIDE 0.9 % IV SOLN
INTRAVENOUS | Status: DC
  Administered 2012-07-19: 07:00:00 via INTRAVENOUS

## 2012-07-19 MED ORDER — MIDAZOLAM HCL 5 MG/5ML IJ SOLN
INTRAMUSCULAR | Status: DC | PRN
  Administered 2012-07-19 (×2): 1 mg via INTRAVENOUS

## 2012-07-19 MED ORDER — MIDAZOLAM HCL 5 MG/5ML IJ SOLN
INTRAMUSCULAR | Status: AC
  Filled 2012-07-19: qty 10

## 2012-07-19 MED ORDER — MEPERIDINE HCL 50 MG/ML IJ SOLN
INTRAMUSCULAR | Status: AC
  Filled 2012-07-19: qty 1

## 2012-07-19 NOTE — H&P (Signed)
[name] is an 66 y.o. male.   Chief Complaint: Patient is here for colonoscopy. HPI: Patient is 66 year old African male who presents with two-week history of intermittent hematochezia. He denies abdominal pain diarrhea or constipation. He has history of colonic adenomas. Large adenoma removed in 2000. His exam in 2005 was normal. H he was supposed to have next exam in 2010. Family history is positive for colon carcinoma who had surgery at 44 and died of dementia several years later.  Past Medical History  Diagnosis Date  . Renal disorder   . Hypotension   . Amyloid heart disease   . Cardiomyopathy   . Gout   . BPH (benign prostatic hypertrophy)   . ESRD (end stage renal disease) on dialysis     Past Surgical History  Procedure Date  . Cholecystectomy   . Hernia repair   . Av fistula placement   . Nephrectomy     left    History reviewed. No pertinent family history. Social History:  reports that he quit smoking about 30 years ago. His smoking use included Cigarettes. He has a .5 pack-year smoking history. He does not have any smokeless tobacco history on file. He reports that he does not drink alcohol or use illicit drugs.  Allergies: No Known Allergies  Medications Prior to Admission  Medication Sig Dispense Refill  . allopurinol (ZYLOPRIM) 100 MG tablet Take 100 mg by mouth daily.      Marland Kitchen aspirin 81 MG tablet Take 81 mg by mouth daily.      . cinacalcet (SENSIPAR) 30 MG tablet Take 30 mg by mouth daily.       . metoCLOPramide (REGLAN) 10 MG tablet Take 10 mg by mouth Three times a day.      . midodrine (PROAMATINE) 10 MG tablet Take 10 mg by mouth 3 (three) times daily.       . multivitamin (RENA-VIT) TABS tablet Take 1 tablet by mouth daily.      Marland Kitchen omeprazole (PRILOSEC) 20 MG capsule Take 20 mg by mouth 2 (two) times daily before a meal.      . peg 3350 powder (MOVIPREP) 100 G SOLR Take 1 kit (100 g total) by mouth once.  1 kit  0  . sevelamer (RENVELA) 800 MG tablet  Take 800 mg by mouth 3 (three) times daily with meals.      . Venlafaxine HCl 75 MG TB24 Take 75 mg by mouth 2 (two) times daily.        No results found for this or any previous visit (from the past 48 hour(s)). No results found.  ROS  Blood pressure 89/56, pulse 86, temperature 98.1 F (36.7 C), temperature source Oral, resp. rate 14, height 5\' 8"  (1.727 m), weight 157 lb (71.215 kg), SpO2 98.00%. Physical Exam  Constitutional: He appears well-developed and well-nourished.  HENT:  Mouth/Throat: Oropharynx is clear and moist.  Eyes: Conjunctivae normal are normal.  Neck: No thyromegaly present.  Cardiovascular: Normal rate, regular rhythm and normal heart sounds.   No murmur heard. Respiratory: Effort normal and breath sounds normal.       Has  right permacath; is still has catheter in his left subclavian crossing over clavicle  GI: Soft. He exhibits no distension and no mass. There is no tenderness.  Musculoskeletal: He exhibits no edema.  Lymphadenopathy:    He has no cervical adenopathy.  Neurological: He is alert.  Skin: Skin is warm and dry.     Assessment/Plan  Rectal bleeding. History of colonic adenomas. Family history of colon carcinoma in a first degree relative. Colonoscopy.  REHMAN,NAJEEB U 07/19/2012, 7:33 AM

## 2012-07-19 NOTE — Op Note (Signed)
COLONOSCOPY PROCEDURE REPORT  PATIENT:  [name]  MR#:  [id number] Birthdate:  [birth year], 66 y.o., male Endoscopist:  Dr. Malissa Hippo, MD Referred By:  Dr. Madelin Rear. Sherwood Gambler, MD Procedure Date: 07/19/2012  Procedure:   Colonoscopy with snare polypectomy.  Indications:  Patient is 66 year old African American male with multiple medical problems who presents with intermittent hematochezia. He had large tubulovillous adenoma removed from his colon in 2000 exam in 2005 was unremarkable. Family history significant for colon carcinoma in his mother at age 59.  Informed Consent:  The procedure and risks were reviewed with the patient and informed consent was obtained.  Medications:  No Demerol was administered because of hypotension which is patient's baseline. Versed 2 mg IV  Description of procedure:  After a digital rectal exam was performed, that colonoscope was advanced from the anus through the rectum and colon to the area of the cecum, ileocecal valve and appendiceal orifice. The cecum was deeply intubated. These structures were well-seen and photographed for the record. From the level of the cecum and ileocecal valve, the scope was slowly and cautiously withdrawn. The mucosal surfaces were carefully surveyed utilizing scope tip to flexion to facilitate fold flattening as needed. The scope was pulled down into the rectum where a thorough exam including retroflexion was performed.  Findings:   Prep satisfactory. Multiple diverticula at ascending colon with few at transverse colon.  Small polyp ablated via cold biopsy from transverse colon. 15 mm polyp with hemorrhagic surface snared from distal sigmoid colon. Scar at distal sigmoid colon site of previous polypectomy. 8 mm polyp snared from rectum. 2 small rectal polyps coagulated using snare tip. Unremarkable anorectal junction.  Therapeutic/Diagnostic Maneuvers Performed:  See above  Complications:  None.  Cecal Withdrawal  Time:  14 minutes  Impression:  Examination performed to cecum. Multiple diverticula at ascending colon with few at transverse colon. Small polyp ablated via cold biopsy from transverse colon. 15 mm polyp snared from sigmoid colon(this polyp may have been source of his hematochezia). 8mm polyp snared from rectum. 2 small rectal polyps were coagulated using snare tip.  Recommendations:  Standard instructions given. I will contact patient with biopsy results and further recommendations.  Mirah Nevins U  07/19/2012 8:16 AM  CC: Dr. Cassell Smiles., MD & Dr. Bonnetta Barry ref. provider found

## 2012-07-22 ENCOUNTER — Encounter (HOSPITAL_COMMUNITY): Payer: Self-pay | Admitting: Internal Medicine

## 2012-07-27 ENCOUNTER — Encounter (INDEPENDENT_AMBULATORY_CARE_PROVIDER_SITE_OTHER): Payer: Self-pay | Admitting: *Deleted

## 2012-07-28 ENCOUNTER — Other Ambulatory Visit (HOSPITAL_COMMUNITY): Payer: Self-pay | Admitting: Family Medicine

## 2012-07-28 ENCOUNTER — Ambulatory Visit (HOSPITAL_COMMUNITY)
Admission: RE | Admit: 2012-07-28 | Discharge: 2012-07-28 | Disposition: A | Discharge status: Home / Self Care | Payer: Medicare Other | Source: Ambulatory Visit | Attending: Family Medicine | Admitting: Family Medicine

## 2012-07-28 DIAGNOSIS — J309 Allergic rhinitis, unspecified: Secondary | ICD-10-CM

## 2012-07-28 DIAGNOSIS — R0602 Shortness of breath: Secondary | ICD-10-CM

## 2012-08-05 ENCOUNTER — Encounter (INDEPENDENT_AMBULATORY_CARE_PROVIDER_SITE_OTHER): Payer: Self-pay

## 2012-12-06 ENCOUNTER — Encounter (INDEPENDENT_AMBULATORY_CARE_PROVIDER_SITE_OTHER): Payer: Self-pay | Admitting: Internal Medicine

## 2012-12-06 ENCOUNTER — Ambulatory Visit (INDEPENDENT_AMBULATORY_CARE_PROVIDER_SITE_OTHER): Payer: Medicare Other | Admitting: Internal Medicine

## 2012-12-06 VITALS — BP 80/46 | HR 76 | Temp 98.6°F | Ht 68.0 in | Wt 157.8 lb

## 2012-12-06 DIAGNOSIS — R197 Diarrhea, unspecified: Secondary | ICD-10-CM

## 2012-12-06 NOTE — Progress Notes (Signed)
Subjective:     Patient ID: [name], male   DOB: [birth year], 67 y.o.   MRN: [id number]  HPI Referred to our office for diarrhea. He has had diarrhea for about 3-4 weeks. He is having 3-4 loose stools a day. The diarrhea does not occur every day.  It usually occurs about every 2 weeks and last 3-4 times that particular week. No fever associated with his symptoms.  No melena or bright red rectal bleeding. Occurs every day.  Seen at Dr. Sharyon Medicus office for same.  Appetite is good. No weight loss.  Completed antibiotic 4 weeks ago for a cold Hx of ESRD. 07/19/2012 Colonoscopy: for rectal bleeding Dr. Rehman:Examination performed to cecum.  Multiple diverticula at ascending colon with few at transverse colon.  Small polyp ablated via cold biopsy from transverse colon.  15 mm polyp snared from sigmoid colon(this polyp may have been source of his hematochezia).  8mm polyp snared from rectum.  2 small rectal polyps were coagulated using snare tip. Biopsy: PM Patient called and message/results left on answering service. All 3 polyps are tubular adenomas including a large one from sigmoid colon. Patient called and message/results left on answering service. Next colonoscopy in 3 years Report to PCP  Review of Systems     Objective:   Physical Exam  Filed Vitals:   12/06/12 1537  BP: 80/46  Pulse: 76  Temp: 98.6 F (37 C)  Height: 5\' 8"  (1.727 m)  Weight: 157 lb 12.8 oz (71.578 kg)    Alert and oriented. Skin warm and dry. Oral mucosa is moist.   . Sclera anicteric, conjunctivae is pink. Thyroid not enlarged. No cervical lymphadenopathy. Lungs clear. Heart regular rate and rhythm.  Abdomen is soft. Bowel sounds are positive. No hepatomegaly. No abdominal masses felt. No tenderness.  No edema to lower extremities. Patient is alert and oriented.    Assessment:    Diarrhea. An infectious process needs to be ruled out. Colonoscopy UTD.    Plan:     Stool studies.Lactoferrin, c-diff,  stool  c and S.  Imodium BID when you have loose stools.

## 2012-12-06 NOTE — Patient Instructions (Signed)
Stool studies. Imodium BID

## 2013-05-15 ENCOUNTER — Other Ambulatory Visit (HOSPITAL_COMMUNITY): Payer: Self-pay | Admitting: Nephrology

## 2013-05-15 ENCOUNTER — Ambulatory Visit (HOSPITAL_COMMUNITY)
Admission: RE | Admit: 2013-05-15 | Discharge: 2013-05-15 | Disposition: A | Discharge status: Home / Self Care | Payer: Medicare Other | Source: Ambulatory Visit | Attending: Nephrology | Admitting: Nephrology

## 2013-05-15 DIAGNOSIS — R7611 Nonspecific reaction to tuberculin skin test without active tuberculosis: Secondary | ICD-10-CM | POA: Insufficient documentation

## 2013-05-26 ENCOUNTER — Other Ambulatory Visit (HOSPITAL_COMMUNITY): Payer: Self-pay | Admitting: Internal Medicine

## 2013-05-26 DIAGNOSIS — N5089 Other specified disorders of the male genital organs: Secondary | ICD-10-CM

## 2013-05-29 ENCOUNTER — Other Ambulatory Visit (HOSPITAL_COMMUNITY): Payer: Medicare Other

## 2013-06-01 ENCOUNTER — Ambulatory Visit (HOSPITAL_COMMUNITY)
Admission: RE | Admit: 2013-06-01 | Discharge: 2013-06-01 | Disposition: A | Discharge status: Home / Self Care | Payer: Medicare Other | Source: Ambulatory Visit | Attending: Internal Medicine | Admitting: Internal Medicine

## 2013-06-01 ENCOUNTER — Other Ambulatory Visit (HOSPITAL_COMMUNITY): Payer: Self-pay | Admitting: Internal Medicine

## 2013-06-01 DIAGNOSIS — N433 Hydrocele, unspecified: Secondary | ICD-10-CM | POA: Insufficient documentation

## 2013-06-01 DIAGNOSIS — N5089 Other specified disorders of the male genital organs: Secondary | ICD-10-CM

## 2013-06-01 DIAGNOSIS — N508 Other specified disorders of male genital organs: Secondary | ICD-10-CM | POA: Insufficient documentation

## 2013-07-11 ENCOUNTER — Ambulatory Visit (INDEPENDENT_AMBULATORY_CARE_PROVIDER_SITE_OTHER): Payer: Medicare Other | Admitting: Urology

## 2013-07-11 DIAGNOSIS — N400 Enlarged prostate without lower urinary tract symptoms: Secondary | ICD-10-CM

## 2013-07-11 DIAGNOSIS — K4090 Unilateral inguinal hernia, without obstruction or gangrene, not specified as recurrent: Secondary | ICD-10-CM

## 2013-07-11 DIAGNOSIS — R972 Elevated prostate specific antigen [PSA]: Secondary | ICD-10-CM

## 2013-11-27 ENCOUNTER — Ambulatory Visit (HOSPITAL_COMMUNITY)
Admission: RE | Admit: 2013-11-27 | Discharge: 2013-11-27 | Disposition: A | Discharge status: Home / Self Care | Payer: Medicare Other | Source: Ambulatory Visit | Attending: Nephrology | Admitting: Nephrology

## 2013-11-27 ENCOUNTER — Other Ambulatory Visit (HOSPITAL_COMMUNITY): Payer: Self-pay | Admitting: Nephrology

## 2013-11-27 DIAGNOSIS — Z111 Encounter for screening for respiratory tuberculosis: Secondary | ICD-10-CM | POA: Insufficient documentation

## 2014-01-01 ENCOUNTER — Telehealth: Payer: Self-pay | Admitting: *Deleted

## 2014-01-01 NOTE — Telephone Encounter (Signed)
REVIEWED.  

## 2014-01-01 NOTE — Telephone Encounter (Signed)
I called [name] and informed her that this pt is a pt of Dr. Olevia Perches and she said she will call his office.

## 2014-01-01 NOTE — Telephone Encounter (Signed)
[name] called from dialysis for pt, Dr. Cristela Felt wants pt to see Dr. Oneida Alar about getting a [name] done, [name] said he will be a new pt here. Please advise Tonya at [phone] she said she will be there until 4:00 today.

## 2014-02-12 ENCOUNTER — Encounter (HOSPITAL_COMMUNITY): Payer: Self-pay | Admitting: Emergency Medicine

## 2014-02-12 ENCOUNTER — Emergency Department (HOSPITAL_COMMUNITY)
Admission: EM | Admit: 2014-02-12 | Discharge: 2014-02-12 | Disposition: A | Discharge status: Home / Self Care | Payer: Medicare Other | Attending: Emergency Medicine | Admitting: Emergency Medicine

## 2014-02-12 ENCOUNTER — Emergency Department (HOSPITAL_COMMUNITY): Payer: Medicare Other

## 2014-02-12 DIAGNOSIS — R059 Cough, unspecified: Secondary | ICD-10-CM | POA: Insufficient documentation

## 2014-02-12 DIAGNOSIS — M109 Gout, unspecified: Secondary | ICD-10-CM | POA: Insufficient documentation

## 2014-02-12 DIAGNOSIS — R509 Fever, unspecified: Secondary | ICD-10-CM | POA: Insufficient documentation

## 2014-02-12 DIAGNOSIS — R05 Cough: Secondary | ICD-10-CM | POA: Insufficient documentation

## 2014-02-12 DIAGNOSIS — Z7982 Long term (current) use of aspirin: Secondary | ICD-10-CM | POA: Insufficient documentation

## 2014-02-12 DIAGNOSIS — Z87891 Personal history of nicotine dependence: Secondary | ICD-10-CM | POA: Insufficient documentation

## 2014-02-12 DIAGNOSIS — R0602 Shortness of breath: Secondary | ICD-10-CM | POA: Insufficient documentation

## 2014-02-12 DIAGNOSIS — R079 Chest pain, unspecified: Secondary | ICD-10-CM | POA: Insufficient documentation

## 2014-02-12 DIAGNOSIS — Z992 Dependence on renal dialysis: Secondary | ICD-10-CM | POA: Insufficient documentation

## 2014-02-12 DIAGNOSIS — Z8679 Personal history of other diseases of the circulatory system: Secondary | ICD-10-CM | POA: Insufficient documentation

## 2014-02-12 DIAGNOSIS — R112 Nausea with vomiting, unspecified: Secondary | ICD-10-CM | POA: Insufficient documentation

## 2014-02-12 DIAGNOSIS — Z79899 Other long term (current) drug therapy: Secondary | ICD-10-CM | POA: Insufficient documentation

## 2014-02-12 DIAGNOSIS — N186 End stage renal disease: Secondary | ICD-10-CM | POA: Insufficient documentation

## 2014-02-12 LAB — CBC WITH DIFFERENTIAL/PLATELET
BASOS PCT: 0 % (ref 0–1)
Basophils Absolute: 0 10*3/uL (ref 0.0–0.1)
EOS ABS: 0.1 10*3/uL (ref 0.0–0.7)
Eosinophils Relative: 1 % (ref 0–5)
HEMATOCRIT: 31.5 % — AB (ref 39.0–52.0)
HEMOGLOBIN: 10.7 g/dL — AB (ref 13.0–17.0)
Lymphocytes Relative: 3 % — ABNORMAL LOW (ref 12–46)
Lymphs Abs: 0.4 10*3/uL — ABNORMAL LOW (ref 0.7–4.0)
MCH: 33 pg (ref 26.0–34.0)
MCHC: 34 g/dL (ref 30.0–36.0)
MCV: 97.2 fL (ref 78.0–100.0)
MONO ABS: 0.4 10*3/uL (ref 0.1–1.0)
MONOS PCT: 4 % (ref 3–12)
Neutro Abs: 10.8 10*3/uL — ABNORMAL HIGH (ref 1.7–7.7)
Neutrophils Relative %: 92 % — ABNORMAL HIGH (ref 43–77)
Platelets: 202 10*3/uL (ref 150–400)
RBC: 3.24 MIL/uL — ABNORMAL LOW (ref 4.22–5.81)
RDW: 13.7 % (ref 11.5–15.5)
WBC: 11.7 10*3/uL — ABNORMAL HIGH (ref 4.0–10.5)

## 2014-02-12 LAB — COMPREHENSIVE METABOLIC PANEL
ALBUMIN: 3.7 g/dL (ref 3.5–5.2)
ALT: 28 U/L (ref 0–53)
AST: 60 U/L — ABNORMAL HIGH (ref 0–37)
Alkaline Phosphatase: 271 U/L — ABNORMAL HIGH (ref 39–117)
BUN: 28 mg/dL — AB (ref 6–23)
CO2: 24 mEq/L (ref 19–32)
Calcium: 8.7 mg/dL (ref 8.4–10.5)
Chloride: 91 mEq/L — ABNORMAL LOW (ref 96–112)
Creatinine, Ser: 7.09 mg/dL — ABNORMAL HIGH (ref 0.50–1.35)
GFR calc Af Amer: 8 mL/min — ABNORMAL LOW (ref >90)
GFR, EST NON AFRICAN AMERICAN: 7 mL/min — AB (ref >90)
Glucose, Bld: 102 mg/dL — ABNORMAL HIGH (ref 70–99)
Potassium: 3.8 mEq/L (ref 3.7–5.3)
Sodium: 135 mEq/L — ABNORMAL LOW (ref 137–147)
Total Bilirubin: 1.1 mg/dL (ref 0.3–1.2)
Total Protein: 9.9 g/dL — ABNORMAL HIGH (ref 6.0–8.3)

## 2014-02-12 LAB — PRO B NATRIURETIC PEPTIDE: PRO B NATRI PEPTIDE: 21129 pg/mL — AB (ref 0–125)

## 2014-02-12 LAB — LACTIC ACID, PLASMA: LACTIC ACID, VENOUS: 3 mmol/L — AB (ref 0.5–2.2)

## 2014-02-12 MED ORDER — DEXTROSE 5 % IV SOLN: 2.0000 g | Freq: Once | INTRAVENOUS | Status: DC

## 2014-02-12 MED ORDER — DEXTROSE 5 % IV SOLN
1.0000 g | INTRAVENOUS | Status: AC
  Administered 2014-02-12: 1 g via INTRAVENOUS
  Filled 2014-02-12: qty 1

## 2014-02-12 MED ORDER — ACETAMINOPHEN 325 MG PO TABS
650.0000 mg | ORAL_TABLET | Freq: Once | ORAL | Status: AC
  Administered 2014-02-12: 650 mg via ORAL
  Filled 2014-02-12: qty 2

## 2014-02-12 MED ORDER — SODIUM CHLORIDE 0.9 % IV SOLN
Freq: Once | INTRAVENOUS | Status: AC
  Administered 2014-02-12: 12:00:00 via INTRAVENOUS

## 2014-02-12 MED ORDER — VANCOMYCIN HCL IN DEXTROSE 1-5 GM/200ML-% IV SOLN
1000.0000 mg | Freq: Once | INTRAVENOUS | Status: AC
  Administered 2014-02-12: 1000 mg via INTRAVENOUS
  Filled 2014-02-12: qty 200

## 2014-02-12 NOTE — ED Notes (Signed)
Sob, n/v, pt was in dialysis today, did not finish.

## 2014-02-12 NOTE — Discharge Instructions (Signed)
Follow up wed for your dialysis.  Dr. Delrae Sawyers will see you then.  Return as needed

## 2014-02-12 NOTE — ED Notes (Signed)
Pt only had 2 hours of dialysis.

## 2014-02-12 NOTE — ED Notes (Addendum)
EDP reported that re-assessed pt and to stop 250 cc bolus. Bolus stopped. Aprroxiamtely 50 cc infused prior to infusion being stopped.

## 2014-02-12 NOTE — ED Notes (Signed)
EDP reported to assess pt bp lying flat. EDP reported if pt bp systolic in 35'D to hold 974 cc bolus. Pt bp 84/60 lying flat. 250 ns bolus started. Will re-assess pt bp in 15 minutes.

## 2014-02-12 NOTE — ED Provider Notes (Signed)
CSN: [id number]     Arrival date & time 02/12/14  1029 History  This chart was scribed for Maudry Diego, MD by Marcha Dutton, ED Scribe. This patient was seen in room APA10/APA10 and the patient's care was started at 11:22 AM.    Chief Complaint  Patient presents with  . Fever      Patient is a 68 y.o. male presenting with chest pain. The history is provided by the patient. No language interpreter was used.  Chest Pain Pain location:  Unable to specify Pain quality: aching   Pain radiates to:  Does not radiate Pain radiates to the back: no   Pain severity:  Moderate Onset quality:  Gradual Duration:  3 hours Timing:  Constant Progression:  Worsening Chronicity:  New Context comment:  Pt reports he was at dialysis today when he felt nausea and began to vomit Ineffective treatments:  None tried Associated symptoms: cough, fever, shortness of breath and vomiting (yellow substance)   Associated symptoms: no abdominal pain, no back pain, no fatigue and no headache   Pt did not finish dialysis today.  Past Medical History  Diagnosis Date  . Renal disorder   . Hypotension   . Amyloid heart disease   . Cardiomyopathy   . Gout   . BPH (benign prostatic hypertrophy)   . ESRD (end stage renal disease) on dialysis    Past Surgical History  Procedure Laterality Date  . Cholecystectomy    . Hernia repair    . Av fistula placement    . Nephrectomy      left  . Colonoscopy  07/19/2012    Procedure: COLONOSCOPY;  Surgeon: Rogene Houston, MD;  Location: [facility];  Service: Endoscopy;  Laterality: N/A;  730   History reviewed. No pertinent family history. History  Substance Use Topics  . Smoking status: Former Smoker -- 0.25 packs/day for 2 years    Types: Cigarettes    Quit date: 09/25/1981  . Smokeless tobacco: Not on file  . Alcohol Use: No    Review of Systems  Constitutional: Positive for fever. Negative for appetite change and fatigue.  HENT: Negative for  congestion, ear discharge and sinus pressure.   Eyes: Negative for discharge.  Respiratory: Positive for cough and shortness of breath.   Cardiovascular: Positive for chest pain.  Gastrointestinal: Positive for vomiting (yellow substance). Negative for abdominal pain and diarrhea.  Genitourinary: Negative for frequency and hematuria.  Musculoskeletal: Negative for back pain.  Skin: Negative for rash.  Neurological: Negative for seizures and headaches.  Psychiatric/Behavioral: Negative for hallucinations.      Allergies  Review of patient's allergies indicates no known allergies.  Home Medications   Prior to Admission medications   Medication Sig Start Date End Date Taking? Authorizing Provider  allopurinol (ZYLOPRIM) 100 MG tablet Take 100 mg by mouth daily.    Historical Provider, MD  aspirin 81 MG tablet Take 81 mg by mouth daily.    Historical Provider, MD  cinacalcet (SENSIPAR) 30 MG tablet Take 30 mg by mouth daily.     Historical Provider, MD  metoCLOPramide (REGLAN) 10 MG tablet Take 10 mg by mouth Three times a day. 06/27/12   Historical Provider, MD  midodrine (PROAMATINE) 10 MG tablet Take 10 mg by mouth 3 (three) times daily.     Historical Provider, MD  multivitamin (RENA-VIT) TABS tablet Take 1 tablet by mouth daily.    Historical Provider, MD  omeprazole (PRILOSEC) 20 MG capsule  Take 20 mg by mouth 2 (two) times daily before a meal.    Historical Provider, MD  sevelamer (RENVELA) 800 MG tablet Take 800 mg by mouth 3 (three) times daily with meals.    Historical Provider, MD  Venlafaxine HCl 75 MG TB24 Take 75 mg by mouth 2 (two) times daily.    Historical Provider, MD   Triage Vitals: BP 82/39  Pulse 85  Temp(Src) 102.2 F (39 C) (Oral)  Resp 20  Ht 5\' 8"  (1.727 m)  Wt 175 lb (79.379 kg)  BMI 26.61 kg/m2  SpO2 100%  Physical Exam  Nursing note and vitals reviewed. Constitutional: He is oriented to person, place, and time. He appears well-developed.  HENT:   Head: Normocephalic.  Eyes: Conjunctivae and EOM are normal. No scleral icterus.  Neck: Neck supple. No thyromegaly present.  Cardiovascular: Normal rate and regular rhythm.  Exam reveals no gallop and no friction rub.   No murmur heard. Pulmonary/Chest: No stridor. He has no wheezes. He has no rales. He exhibits no tenderness.  Abdominal: He exhibits no distension. There is no tenderness. There is no rebound.  Musculoskeletal: Normal range of motion. He exhibits no edema.  Lymphadenopathy:    He has no cervical adenopathy.  Neurological: He is oriented to person, place, and time. He exhibits normal muscle tone. Coordination normal.  Skin: No rash noted. No erythema.  Psychiatric: He has a normal mood and affect. His behavior is normal.    ED Course  Procedures (including critical care time) DIAGNOSTIC STUDIES: Oxygen Saturation is 100% on RA, normal by my interpretation.    COORDINATION OF CARE: 11:25 AM- Pt advised of plan for treatment and pt agrees.     Labs Review Labs Reviewed - No data to display  Imaging Review No results found.   EKG Interpretation None     The pt refused admission.  I spoke with Dr. Delrae Sawyers and he stated to give him Lucianne Lei and ceftazidime and he will see him wed at dialysis MDM   Final diagnoses:  None   The chart was scribed for me under my direct supervision.  I personally performed the history, physical, and medical decision making and all procedures in the evaluation of this patient.Maudry Diego, MD 02/12/14 [phone]

## 2014-02-12 NOTE — ED Notes (Signed)
EDP aware of pt bp. EDP reported to admin 250 ns bolus and re-assess bp.

## 2014-02-12 NOTE — ED Notes (Signed)
Vital signs now normal, other than fever. Pt also states pain to chest, cough, vomiting.

## 2014-02-13 ENCOUNTER — Telehealth (HOSPITAL_BASED_OUTPATIENT_CLINIC_OR_DEPARTMENT_OTHER): Payer: Self-pay

## 2014-02-13 NOTE — Telephone Encounter (Signed)
[name] lab called with prelim blood culture results. All 4 bottles growing gram positive cocci in clusters.  Dr Winfred Leeds reviewed chart. Ordered- call pt and inform. If feeling better no need to return but make sure you go to next Hemodialysis tx. If not feeling well or feeling worse, return to ed for further evaluation.  I contacted pt. He is feeling better. Next HD is on Wednesday. He does plan to go. I stressed importance of getting tx. He verbalized understanding.

## 2014-02-15 LAB — CULTURE, BLOOD (ROUTINE X 2)

## 2014-02-15 NOTE — Progress Notes (Signed)
ED Antimicrobial Stewardship Positive Culture Follow Up   [name] is an 68 y.o. male who presented to [facility] on 02/12/2014 with a chief complaint of  Chief Complaint  Patient presents with  . Fever    Recent Results (from the past 720 hour(s))  CULTURE, BLOOD (ROUTINE X 2)     Status: None   Collection Time    02/12/14 11:39 AM      Result Value Ref Range Status   Specimen Description BLOOD RIGHT ANTECUBITAL   Final   Special Requests     Final   Value: BOTTLES DRAWN AEROBIC AND ANAEROBIC 10CC DIALYSIS PT   Culture  Setup Time     Final   Value: 02/13/2014 12:50     Performed at [facility]   Culture     Final   Value: STAPHYLOCOCCUS SPECIES (COAGULASE NEGATIVE)     Note: RIFAMPIN AND GENTAMICIN SHOULD NOT BE USED AS SINGLE DRUGS FOR TREATMENT OF STAPH INFECTIONS. This organism is presumed to be Clindamycin resistant based on detection of inducible Clindamycin resistance.     Note: Gram Stain Report Called to,Read Back By and Verified With: FESTERMAN T (FLOW MANAGER) AT 1340 ON 02/13/2014 BY Tyrone Schimke M     Performed at [facility]   Report Status 02/15/2014 FINAL   Final   Organism ID, Bacteria STAPHYLOCOCCUS SPECIES (COAGULASE NEGATIVE)   Final  CULTURE, BLOOD (ROUTINE X 2)     Status: None   Collection Time    02/12/14 11:44 AM      Result Value Ref Range Status   Specimen Description BLOOD RIGHT ANTECUBITAL   Final   Special Requests     Final   Value: BOTTLES DRAWN AEROBIC AND ANAEROBIC 12CC  DIALYSIS PT   Culture  Setup Time     Final   Value: 02/13/2014 11:45     Performed at [facility]   Culture     Final   Value: STAPHYLOCOCCUS SPECIES (COAGULASE NEGATIVE)     Note: SUSCEPTIBILITIES PERFORMED ON PREVIOUS CULTURE WITHIN THE LAST 5 DAYS.     Note: Gram Stain Report Called to,Read Back By and Verified With: FESTERMAN T (FLOW MANAGER) AT 1340 ON 02/13/2014 BY Elza Rafter     Performed at [facility]   Report Status 02/15/2014  FINAL   Final   Blood culture results finalized this AM. Spoke with Dr. Florentina Addison office and [facility]. Patient presented for dialysis yesterday and received Vancomycin and Ceftazidime per [facility] staff. Per request, faxed blood culture results to [phone] ([facility]).    [name] [facility] 02/15/2014, 12:20 PM Infectious Diseases Pharmacist Phone# [phone]

## 2014-05-09 ENCOUNTER — Emergency Department (HOSPITAL_COMMUNITY): Payer: Medicare Other

## 2014-05-09 ENCOUNTER — Encounter (HOSPITAL_COMMUNITY): Payer: Self-pay | Admitting: Emergency Medicine

## 2014-05-09 ENCOUNTER — Inpatient Hospital Stay (HOSPITAL_COMMUNITY): Payer: Medicare Other

## 2014-05-09 ENCOUNTER — Inpatient Hospital Stay (HOSPITAL_COMMUNITY)
Admission: EM | Admit: 2014-05-09 | Discharge: 2014-05-15 | DRG: 871 | Disposition: A | Discharge status: Other Acute Inpatient Hospital | Payer: Medicare Other | Attending: Internal Medicine | Admitting: Internal Medicine

## 2014-05-09 DIAGNOSIS — I428 Other cardiomyopathies: Secondary | ICD-10-CM | POA: Diagnosis present

## 2014-05-09 DIAGNOSIS — I9589 Other hypotension: Secondary | ICD-10-CM | POA: Diagnosis present

## 2014-05-09 DIAGNOSIS — D631 Anemia in chronic kidney disease: Secondary | ICD-10-CM | POA: Diagnosis present

## 2014-05-09 DIAGNOSIS — N400 Enlarged prostate without lower urinary tract symptoms: Secondary | ICD-10-CM | POA: Diagnosis present

## 2014-05-09 DIAGNOSIS — I43 Cardiomyopathy in diseases classified elsewhere: Secondary | ICD-10-CM | POA: Diagnosis present

## 2014-05-09 DIAGNOSIS — I959 Hypotension, unspecified: Secondary | ICD-10-CM

## 2014-05-09 DIAGNOSIS — N039 Chronic nephritic syndrome with unspecified morphologic changes: Secondary | ICD-10-CM

## 2014-05-09 DIAGNOSIS — A419 Sepsis, unspecified organism: Secondary | ICD-10-CM | POA: Diagnosis present

## 2014-05-09 DIAGNOSIS — E8589 Other amyloidosis: Secondary | ICD-10-CM | POA: Diagnosis present

## 2014-05-09 DIAGNOSIS — R000 Tachycardia, unspecified: Secondary | ICD-10-CM | POA: Diagnosis present

## 2014-05-09 DIAGNOSIS — N2581 Secondary hyperparathyroidism of renal origin: Secondary | ICD-10-CM | POA: Diagnosis present

## 2014-05-09 DIAGNOSIS — Z905 Acquired absence of kidney: Secondary | ICD-10-CM

## 2014-05-09 DIAGNOSIS — K652 Spontaneous bacterial peritonitis: Secondary | ICD-10-CM | POA: Diagnosis present

## 2014-05-09 DIAGNOSIS — Z992 Dependence on renal dialysis: Secondary | ICD-10-CM | POA: Diagnosis not present

## 2014-05-09 DIAGNOSIS — Z79899 Other long term (current) drug therapy: Secondary | ICD-10-CM | POA: Diagnosis not present

## 2014-05-09 DIAGNOSIS — R079 Chest pain, unspecified: Secondary | ICD-10-CM

## 2014-05-09 DIAGNOSIS — E854 Organ-limited amyloidosis: Secondary | ICD-10-CM | POA: Diagnosis present

## 2014-05-09 DIAGNOSIS — Z7901 Long term (current) use of anticoagulants: Secondary | ICD-10-CM | POA: Diagnosis not present

## 2014-05-09 DIAGNOSIS — R188 Other ascites: Secondary | ICD-10-CM | POA: Diagnosis present

## 2014-05-09 DIAGNOSIS — E889 Metabolic disorder, unspecified: Secondary | ICD-10-CM

## 2014-05-09 DIAGNOSIS — N186 End stage renal disease: Secondary | ICD-10-CM | POA: Diagnosis present

## 2014-05-09 DIAGNOSIS — M109 Gout, unspecified: Secondary | ICD-10-CM | POA: Diagnosis present

## 2014-05-09 DIAGNOSIS — E639 Nutritional deficiency, unspecified: Secondary | ICD-10-CM | POA: Diagnosis present

## 2014-05-09 DIAGNOSIS — R0600 Dyspnea, unspecified: Secondary | ICD-10-CM | POA: Diagnosis present

## 2014-05-09 DIAGNOSIS — Z87891 Personal history of nicotine dependence: Secondary | ICD-10-CM

## 2014-05-09 DIAGNOSIS — R509 Fever, unspecified: Secondary | ICD-10-CM

## 2014-05-09 DIAGNOSIS — R0789 Other chest pain: Secondary | ICD-10-CM | POA: Diagnosis present

## 2014-05-09 DIAGNOSIS — R0902 Hypoxemia: Secondary | ICD-10-CM | POA: Diagnosis present

## 2014-05-09 LAB — CBC WITH DIFFERENTIAL/PLATELET
BASOS ABS: 0 10*3/uL (ref 0.0–0.1)
BASOS PCT: 0 % (ref 0–1)
EOS ABS: 0.1 10*3/uL (ref 0.0–0.7)
Eosinophils Relative: 2 % (ref 0–5)
HCT: 31.4 % — ABNORMAL LOW (ref 39.0–52.0)
Hemoglobin: 10.6 g/dL — ABNORMAL LOW (ref 13.0–17.0)
Lymphocytes Relative: 6 % — ABNORMAL LOW (ref 12–46)
Lymphs Abs: 0.3 10*3/uL — ABNORMAL LOW (ref 0.7–4.0)
MCH: 32.3 pg (ref 26.0–34.0)
MCHC: 33.8 g/dL (ref 30.0–36.0)
MCV: 95.7 fL (ref 78.0–100.0)
Monocytes Absolute: 0.1 10*3/uL (ref 0.1–1.0)
Monocytes Relative: 1 % — ABNORMAL LOW (ref 3–12)
NEUTROS PCT: 91 % — AB (ref 43–77)
Neutro Abs: 4.5 10*3/uL (ref 1.7–7.7)
Platelets: 156 10*3/uL (ref 150–400)
RBC: 3.28 MIL/uL — ABNORMAL LOW (ref 4.22–5.81)
RDW: 14.2 % (ref 11.5–15.5)
WBC: 4.9 10*3/uL (ref 4.0–10.5)

## 2014-05-09 LAB — LACTIC ACID, PLASMA: LACTIC ACID, VENOUS: 4.6 mmol/L — AB (ref 0.5–2.2)

## 2014-05-09 LAB — COMPREHENSIVE METABOLIC PANEL
ALBUMIN: 3.7 g/dL (ref 3.5–5.2)
ALT: 19 U/L (ref 0–53)
AST: 34 U/L (ref 0–37)
Alkaline Phosphatase: 221 U/L — ABNORMAL HIGH (ref 39–117)
Anion gap: 19 — ABNORMAL HIGH (ref 5–15)
BILIRUBIN TOTAL: 0.9 mg/dL (ref 0.3–1.2)
BUN: 18 mg/dL (ref 6–23)
CO2: 25 mEq/L (ref 19–32)
CREATININE: 5.49 mg/dL — AB (ref 0.50–1.35)
Calcium: 8.4 mg/dL (ref 8.4–10.5)
Chloride: 93 mEq/L — ABNORMAL LOW (ref 96–112)
GFR calc Af Amer: 11 mL/min — ABNORMAL LOW (ref >90)
GFR calc non Af Amer: 10 mL/min — ABNORMAL LOW (ref >90)
Glucose, Bld: 83 mg/dL (ref 70–99)
POTASSIUM: 3.8 meq/L (ref 3.7–5.3)
Sodium: 137 mEq/L (ref 137–147)
TOTAL PROTEIN: 9.2 g/dL — AB (ref 6.0–8.3)

## 2014-05-09 LAB — TROPONIN I: Troponin I: <0.3 ng/mL (ref <0.30)

## 2014-05-09 LAB — PRO B NATRIURETIC PEPTIDE: PRO B NATRI PEPTIDE: 32097 pg/mL — AB (ref 0–125)

## 2014-05-09 LAB — MRSA PCR SCREENING: MRSA BY PCR: POSITIVE — AB

## 2014-05-09 MED ORDER — SODIUM CHLORIDE 0.9 % IJ SOLN
3.0000 mL | Freq: Two times a day (BID) | INTRAMUSCULAR | Status: DC
  Administered 2014-05-09 – 2014-05-11 (×7): 3 mL via INTRAVENOUS
  Administered 2014-05-12: 09:00:00 via INTRAVENOUS
  Administered 2014-05-12 – 2014-05-14 (×4): 3 mL via INTRAVENOUS

## 2014-05-09 MED ORDER — MUPIROCIN 2 % EX OINT
1.0000 "application " | TOPICAL_OINTMENT | Freq: Two times a day (BID) | CUTANEOUS | Status: AC
  Administered 2014-05-09 – 2014-05-13 (×9): 1 via NASAL
  Filled 2014-05-09 (×6): qty 22

## 2014-05-09 MED ORDER — VANCOMYCIN HCL 10 G IV SOLR
1500.0000 mg | Freq: Once | INTRAVENOUS | Status: DC
  Filled 2014-05-09: qty 1500

## 2014-05-09 MED ORDER — ALLOPURINOL 100 MG PO TABS
100.0000 mg | ORAL_TABLET | Freq: Every day | ORAL | Status: DC
  Administered 2014-05-09 – 2014-05-13 (×5): 100 mg via ORAL
  Filled 2014-05-09 (×5): qty 1

## 2014-05-09 MED ORDER — ENOXAPARIN SODIUM 30 MG/0.3ML ~~LOC~~ SOLN
30.0000 mg | SUBCUTANEOUS | Status: DC
  Administered 2014-05-09 – 2014-05-14 (×6): 30 mg via SUBCUTANEOUS
  Filled 2014-05-09 (×6): qty 0.3

## 2014-05-09 MED ORDER — TRAZODONE HCL 50 MG PO TABS: 25.0000 mg | ORAL_TABLET | Freq: Every evening | ORAL | Status: DC | PRN

## 2014-05-09 MED ORDER — ACETAMINOPHEN 650 MG RE SUPP: 650.0000 mg | Freq: Four times a day (QID) | RECTAL | Status: DC | PRN

## 2014-05-09 MED ORDER — ONDANSETRON HCL 4 MG PO TABS
4.0000 mg | ORAL_TABLET | Freq: Four times a day (QID) | ORAL | Status: DC | PRN
  Administered 2014-05-14: 4 mg via ORAL
  Filled 2014-05-09: qty 1

## 2014-05-09 MED ORDER — PIPERACILLIN-TAZOBACTAM 3.375 G IVPB 30 MIN
3.3750 g | Freq: Once | INTRAVENOUS | Status: AC
  Administered 2014-05-09: 3.375 g via INTRAVENOUS
  Filled 2014-05-09: qty 50

## 2014-05-09 MED ORDER — ACETAMINOPHEN 325 MG PO TABS
650.0000 mg | ORAL_TABLET | Freq: Four times a day (QID) | ORAL | Status: DC | PRN
  Administered 2014-05-12 – 2014-05-14 (×2): 650 mg via ORAL
  Filled 2014-05-09 (×2): qty 2

## 2014-05-09 MED ORDER — HYDROCODONE-ACETAMINOPHEN 5-325 MG PO TABS
1.0000 | ORAL_TABLET | ORAL | Status: DC | PRN
  Administered 2014-05-09: 2 via ORAL
  Filled 2014-05-09: qty 2

## 2014-05-09 MED ORDER — IOHEXOL 350 MG/ML SOLN
100.0000 mL | Freq: Once | INTRAVENOUS | Status: AC | PRN
  Administered 2014-05-09: 100 mL via INTRAVENOUS

## 2014-05-09 MED ORDER — SODIUM CHLORIDE 0.9 % IV BOLUS (SEPSIS): 250.0000 mL | Freq: Once | INTRAVENOUS | Status: DC

## 2014-05-09 MED ORDER — DOCUSATE SODIUM 100 MG PO CAPS
100.0000 mg | ORAL_CAPSULE | Freq: Two times a day (BID) | ORAL | Status: DC
  Administered 2014-05-09 – 2014-05-13 (×9): 100 mg via ORAL
  Filled 2014-05-09 (×11): qty 1

## 2014-05-09 MED ORDER — PIPERACILLIN-TAZOBACTAM IN DEX 2-0.25 GM/50ML IV SOLN
2.2500 g | Freq: Three times a day (TID) | INTRAVENOUS | Status: DC
  Administered 2014-05-09 – 2014-05-14 (×13): 2.25 g via INTRAVENOUS
  Filled 2014-05-09 (×19): qty 50

## 2014-05-09 MED ORDER — CHLORHEXIDINE GLUCONATE CLOTH 2 % EX PADS
6.0000 | MEDICATED_PAD | Freq: Every day | CUTANEOUS | Status: AC
  Administered 2014-05-10 – 2014-05-13 (×3): 6 via TOPICAL

## 2014-05-09 MED ORDER — VENLAFAXINE HCL 37.5 MG PO TABS
75.0000 mg | ORAL_TABLET | Freq: Two times a day (BID) | ORAL | Status: DC
  Administered 2014-05-09 – 2014-05-15 (×11): 75 mg via ORAL
  Filled 2014-05-09 (×11): qty 2

## 2014-05-09 MED ORDER — ASPIRIN EC 81 MG PO TBEC
81.0000 mg | DELAYED_RELEASE_TABLET | Freq: Every day | ORAL | Status: DC
  Administered 2014-05-09 – 2014-05-13 (×5): 81 mg via ORAL
  Filled 2014-05-09 (×5): qty 1

## 2014-05-09 MED ORDER — RENA-VITE PO TABS
1.0000 | ORAL_TABLET | Freq: Every day | ORAL | Status: DC
  Administered 2014-05-09 – 2014-05-13 (×5): 1 via ORAL
  Filled 2014-05-09 (×5): qty 1

## 2014-05-09 MED ORDER — ALUM & MAG HYDROXIDE-SIMETH 200-200-20 MG/5ML PO SUSP: 30.0000 mL | Freq: Four times a day (QID) | ORAL | Status: DC | PRN

## 2014-05-09 MED ORDER — SODIUM CHLORIDE 0.9 % IV SOLN
Freq: Once | INTRAVENOUS | Status: AC
  Administered 2014-05-09: 11:00:00 via INTRAVENOUS

## 2014-05-09 MED ORDER — CINACALCET HCL 30 MG PO TABS
30.0000 mg | ORAL_TABLET | Freq: Every day | ORAL | Status: DC
  Filled 2014-05-09 (×2): qty 1

## 2014-05-09 MED ORDER — MIDODRINE HCL 5 MG PO TABS
20.0000 mg | ORAL_TABLET | Freq: Three times a day (TID) | ORAL | Status: DC
  Administered 2014-05-09 – 2014-05-15 (×16): 20 mg via ORAL
  Filled 2014-05-09 (×16): qty 4

## 2014-05-09 MED ORDER — ACETAMINOPHEN 500 MG PO TABS
1000.0000 mg | ORAL_TABLET | Freq: Once | ORAL | Status: AC
  Administered 2014-05-09: 1000 mg via ORAL
  Filled 2014-05-09: qty 2

## 2014-05-09 MED ORDER — VENLAFAXINE HCL ER 75 MG PO CP24: 75.0000 mg | ORAL_CAPSULE | Freq: Two times a day (BID) | ORAL | Status: DC

## 2014-05-09 MED ORDER — SEVELAMER CARBONATE 800 MG PO TABS
800.0000 mg | ORAL_TABLET | Freq: Three times a day (TID) | ORAL | Status: DC
  Administered 2014-05-09 – 2014-05-15 (×15): 800 mg via ORAL
  Filled 2014-05-09 (×15): qty 1

## 2014-05-09 MED ORDER — ONDANSETRON HCL 4 MG/2ML IJ SOLN: 4.0000 mg | Freq: Four times a day (QID) | INTRAMUSCULAR | Status: DC | PRN

## 2014-05-09 MED ORDER — VANCOMYCIN HCL IN DEXTROSE 1-5 GM/200ML-% IV SOLN
1000.0000 mg | Freq: Once | INTRAVENOUS | Status: DC
  Administered 2014-05-09: 1000 mg via INTRAVENOUS
  Filled 2014-05-09: qty 200

## 2014-05-09 MED ORDER — METOCLOPRAMIDE HCL 10 MG PO TABS
10.0000 mg | ORAL_TABLET | Freq: Three times a day (TID) | ORAL | Status: DC
  Administered 2014-05-09 – 2014-05-15 (×16): 10 mg via ORAL
  Filled 2014-05-09 (×16): qty 1

## 2014-05-09 MED ORDER — PANTOPRAZOLE SODIUM 40 MG PO TBEC
40.0000 mg | DELAYED_RELEASE_TABLET | Freq: Every day | ORAL | Status: DC
  Administered 2014-05-09 – 2014-05-13 (×5): 40 mg via ORAL
  Filled 2014-05-09 (×6): qty 1

## 2014-05-09 NOTE — H&P (Signed)
Patient seen and examined.  Note reviewed.  This patient was sent from his dialysis center to the emergency room with complaints of chest pain. Patient was noted to be febrile and hypotensive. 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 His family reports that his blood pressure chronically runs in the 70s. He was evaluated in the emergency room and noted to be febrile. WBC count is normal. Of note, patient recently had a coagulase-negative staph bacteremia in 01/2014. He was treated with intravenous antibiotics at that time. Review of chest x-rays indicate that his dialysis catheter was exchanged. At this point, he does meet criteria for sepsis. He does not have any clear source of infection since he does not have any significant pneumonia. He does have ascites and we will attempt a paracentesis. Blood cultures have been sent to rule out recurrent bacteremia. He'll be continued on broad-spectrum antibiotics for now. We'll need to treat his hypertension with IV fluids, will need patient is clinically symptomatic. This would include mental status changes. The patient did undergo dialysis today and receive the majority of his treatment. Nephrology has been consulted.  MEMON,JEHANZEB

## 2014-05-09 NOTE — ED Notes (Signed)
Despite BP pt remains alert and responsive, no co dizziness.

## 2014-05-09 NOTE — ED Provider Notes (Signed)
CSN: [id number]     Arrival date & time 05/09/14  1031 History  This chart was scribed for Larry Diego, MD by Starleen Arms, ED Scribe. This patient was seen in room APA10/APA10 and the patient's care was started at 10:44 AM.   Chief Complaint  Patient presents with  . Chest Pain   Patient is a 68 y.o. male presenting with chest pain. The history is provided by the patient. No language interpreter was used.  Chest Pain Pain quality: aching   Pain radiates to:  Does not radiate Pain radiates to the back: no   Pain severity:  Moderate Onset quality:  Gradual Timing:  Constant Progression:  Unchanged Chronicity:  Recurrent (Previous episode of similar quality 2 days previous) Relieved by:  Nothing Worsened by:  Nothing tried Associated symptoms: headache   Associated symptoms: no abdominal pain, no back pain, no cough and no fatigue     HPI Comments: [name] is a 68 y.o. male who presents to the Emergency Department complaining of chest pain onset today. Patient receives dialysis 3 days/week for approximately 8 years and was referred to the ED by the dialysis clinic when he complained of chest pain and the staff observed hypotension.  Patient states that the chest pain feels like an ache.  Patient also reports chills and has had a non-productive cough.  The family reports that similar episode of chest pain occurred Monday while the patient was receiving dialysis.  Patient has associated headache on the anterior portion of the cranium.   Patient does not use at-home oxygen.  Patient has associated headache on the anterior portion of the cranium.      Past Medical History  Diagnosis Date  . Renal disorder   . Hypotension   . Amyloid heart disease   . Cardiomyopathy   . Gout   . BPH (benign prostatic hypertrophy)   . ESRD (end stage renal disease) on dialysis    Past Surgical History  Procedure Laterality Date  . Cholecystectomy    . Hernia repair    . Av fistula  placement    . Nephrectomy      left  . Colonoscopy  07/19/2012    Procedure: COLONOSCOPY;  Surgeon: Rogene Houston, MD;  Location: [facility];  Service: Endoscopy;  Laterality: N/A;  730   History reviewed. No pertinent family history. History  Substance Use Topics  . Smoking status: Former Smoker -- 0.25 packs/day for 2 years    Types: Cigarettes    Quit date: 09/25/1981  . Smokeless tobacco: Not on file  . Alcohol Use: No    Review of Systems  Constitutional: Negative for appetite change and fatigue.  HENT: Negative for congestion, ear discharge and sinus pressure.   Eyes: Negative for discharge.  Respiratory: Negative for cough.   Cardiovascular: Positive for chest pain.  Gastrointestinal: Negative for abdominal pain and diarrhea.  Genitourinary: Negative for frequency and hematuria.  Musculoskeletal: Negative for back pain.  Skin: Negative for rash.  Neurological: Positive for headaches. Negative for seizures.  Psychiatric/Behavioral: Negative for hallucinations.      Allergies  Review of patient's allergies indicates no known allergies.  Home Medications   Prior to Admission medications   Medication Sig Start Date End Date Taking? Authorizing Provider  allopurinol (ZYLOPRIM) 100 MG tablet Take 100 mg by mouth daily.   Yes Historical Provider, MD  aspirin 81 MG tablet Take 81 mg by mouth daily.   Yes Historical Provider, MD  cinacalcet (SENSIPAR) 30 MG tablet Take 30 mg by mouth daily.    Yes Historical Provider, MD  metoCLOPramide (REGLAN) 10 MG tablet Take 10 mg by mouth Three times a day. 06/27/12  Yes Historical Provider, MD  midodrine (PROAMATINE) 10 MG tablet Take 20 mg by mouth 3 (three) times daily. 12/03/11  Yes Historical Provider, MD  multivitamin (RENA-VIT) TABS tablet Take 1 tablet by mouth daily.   Yes Historical Provider, MD  omeprazole (PRILOSEC) 20 MG capsule Take 20 mg by mouth 2 (two) times daily before a meal.   Yes Historical Provider, MD   sevelamer (RENVELA) 800 MG tablet Take 800 mg by mouth 3 (three) times daily with meals.   Yes Historical Provider, MD  Venlafaxine HCl 75 MG TB24 Take 75 mg by mouth 2 (two) times daily.   Yes Historical Provider, MD   Triage Vitals: BP 75/51  Pulse 73  Temp(Src) 99.9 F (37.7 C) (Oral)  Resp 20  Ht 5\' 8"  (1.727 m)  Wt 162 lb (73.483 kg)  BMI 24.64 kg/m2  SpO2 92%  Physical Exam  Nursing note and vitals reviewed. Constitutional: He is oriented to person, place, and time. He appears well-developed.  Shivering  HENT:  Head: Normocephalic.  Eyes: Conjunctivae and EOM are normal. No scleral icterus.  Neck: Neck supple. No thyromegaly present.  Cardiovascular: Exam reveals no gallop and no friction rub.   No murmur heard. Rapid irregular heart beat.  Has right subclavian central line used for dialysis.  Pulmonary/Chest: No stridor. He has no wheezes. He has no rales. He exhibits no tenderness.  Abdominal: He exhibits no distension. There is no tenderness. There is no rebound.  Musculoskeletal: Normal range of motion. He exhibits no edema.  Lymphadenopathy:    He has no cervical adenopathy.  Neurological: He is oriented to person, place, and time. He exhibits normal muscle tone. Coordination normal.  Skin: No rash noted. No erythema.  Psychiatric: He has a normal mood and affect. His behavior is normal.    ED Course  Procedures (including critical care time)  DIAGNOSTIC STUDIES: Oxygen Saturation is 92% on 2L/min, adequate by my interpretation.    COORDINATION OF CARE:  10:50 AM Will order labs, EKG, and IV fluids.  Patient acknowledges and agrees with plan.   Labs Review Labs Reviewed  CULTURE, BLOOD (ROUTINE X 2)  CULTURE, BLOOD (ROUTINE X 2)  CBC WITH DIFFERENTIAL  COMPREHENSIVE METABOLIC PANEL  LACTIC ACID, PLASMA  TROPONIN I  PRO B NATRIURETIC PEPTIDE    Imaging Review Dg Chest Portable 1 View  05/09/2014   CLINICAL DATA:  Chest pain, end-stage renal  disease, amyloid cardiomyopathy  EXAM: PORTABLE CHEST - 1 VIEW  COMPARISON:  Portable chest x-ray of February 12, 2014  FINDINGS: The lungs are adequately inflated. There is no focal infiltrate. The interstitial markings are mildly prominent though stable. The cardiac silhouette is top-normal in size but stable. There is no pleural effusion or significant pulmonary vascular and congestion. Two large caliber dialysis type catheters are in place via bilateral internal jugular approach. The tips lie in the mid and distal portions of the SVC. The bony thorax is unremarkable.  IMPRESSION: There is no acute cardiopulmonary abnormality.   Electronically Signed   By: David  Martinique   On: 05/09/2014 11:12     EKG Interpretation   Date/Time:  Wednesday May 09 2014 10:40:49 EDT Ventricular Rate:  138 PR Interval:    QRS Duration: 78 QT Interval:  325 QTC Calculation: 492  R Axis:   -90 Text Interpretation:  Atrial fibrillation with rapid V-rate Paired  ventricular premature complexes Consider inferior infarct Anterolateral  infarct, age indeterminate Artifact in lead(s) I II III aVR aVL aVF V1 V2  V3 V4 V5 Confirmed by Kreig Parson  MD, Kasra Melvin [phone]) on 05/09/2014 1:25:40 PM     CRITICAL CARE Performed by: Garold Sheeler L Total critical care time: 45 Critical care time was exclusive of separately billable procedures and treating other patients. Critical care was necessary to treat or prevent imminent or life-threatening deterioration. Critical care was time spent personally by me on the following activities: development of treatment plan with patient and/or surrogate as well as nursing, discussions with consultants, evaluation of patient's response to treatment, examination of patient, obtaining history from patient or surrogate, ordering and performing treatments and interventions, ordering and review of laboratory studies, ordering and review of radiographic studies, pulse oximetry and re-evaluation of  patient's condition.  MDM   Final diagnoses:  None    The chart was scribed for me under my direct supervision.  I personally performed the history, physical, and medical decision making and all procedures in the evaluation of this patient.Larry Diego, MD 05/09/14 [phone]

## 2014-05-09 NOTE — ED Notes (Signed)
Getting ready to transfer pt to floor when ultrasound arrived on unit- Will inform this nurse when she has completed.  So pt can be transferred up to [facility] . Pt also ate lunch. Stated that he feels better

## 2014-05-09 NOTE — Progress Notes (Signed)
MD paged in regards to BP readings.

## 2014-05-09 NOTE — ED Notes (Signed)
Iv running in via pump at rate of 545mls /hr to administer bolus of 240mls

## 2014-05-09 NOTE — Consult Note (Signed)
[name] MRN: [id number] DOB/AGE: [birth year] 68 y.o. Primary Care Physician:FUSCO,LAWRENCE J., MD Admit date: 05/09/2014 Chief Complaint:  Chief Complaint  Patient presents with  . Chest Pain   HPI: Pt is 68 year old male with past medical hx of ESRD, amyloidosis who came to Er with c/o chest pain.  HPI dates back to last Monday when pt has chest pain which last for short while.Later pt pain subsided. Today pt  started having chest pain again.pt was on renal replacement therapy at that time. Pt does not  Complains of any radiation of pain  no c/o diaphoresis NO c/o nausea/ vomiting. NO c/o dyspnea  no c/o orthopnea Pt was found to be febrile and hypotensive in er and started on sepsis protocol. Pt c/o cough, productive . Pt offers no other specific complaints. Pt did receive 3 hrs and  17mins of his dialysis tx today.      Past Medical History  Diagnosis Date  . Renal disorder   . Hypotension   . Amyloid heart disease   . Cardiomyopathy   . Gout   . BPH (benign prostatic hypertrophy)   . ESRD (end stage renal disease) on dialysis         History reviewed. No pertinent family history.  Social History:  reports that he quit smoking about 32 years ago. His smoking use included Cigarettes. He has a .5 pack-year smoking history. He does not have any smokeless tobacco history on file. He reports that he does not drink alcohol or use illicit drugs.   Allergies: No Known Allergies   (Not in a hospital admission)     WPY:KDXIP from the symptoms mentioned above,there are no other symptoms referable to all systems reviewed.  Physical Exam: Vital signs in last 24 hours: Temp:  [99.2 F (37.3 C)-101.1 F (38.4 C)] 99.2 F (37.3 C) (07/15 1412) Pulse Rate:  [57-113] 86 (07/15 1430) Resp:  [10-29] 10 (07/15 1430) BP: (67-76)/(47-59) 68/53 mmHg (07/15 1430) SpO2:  [92 %-99 %] 99 % (07/15 1430) Weight:  [162 lb (73.483 kg)] 162 lb (73.483 kg) (07/15 1053) Weight  change:       Physical Exam: General- pt is awake,alert, oriented to time place and person Resp- Mild REsp distress, decreased bs at bases CVS- S1S2 regular in rate and rhythm GIT- BS+, soft, NT, distended+, fluid thrill +  EXT- NO LE Edema, Cyanosis CNS- CN 2-12 grossly intact. 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 Phosphorus at goal.   5)ID- sepsis On vanco and Zosyn  Primary MD following  6)Electrolytes   Normokalemic NOrmonatremic   7)Acid base Co2 at goal  8) Cardiac Admitted w chest pain Hx of Cardiac amyloidosis  9) GI- Ascites Needs tap  Plan:  NO need of Hd Today Will continue to follow Use IVf PRN basis( Not scheduled IVF) as pt has hx of Cardiac amyloidosis, Ascitics, and ESRD with hx of chronic hypotension. Agree with current tx and plan        Kesley Mullens S 05/09/2014, 2:52 PM

## 2014-05-09 NOTE — ED Notes (Signed)
Sent from Hemodialysis, for chest pain and SOB. Per son pts BP was "lower" than normal there; Current bp 136/64. Pt visibly SOB, Assecory muscle use. Per daughter this also happened Monday during Dialysis

## 2014-05-09 NOTE — H&P (Signed)
[facility] History and Physical  [name] HYQ:[id number] DOB: Dec 07, 1945 DOA: 05/09/2014  Referring physician:  PCP: Glo Herring., MD   Chief Complaint: chest pain  HPI: [name] is a 68 y.o. male with past medical history of end-stage renal disease on dialysis, chronic hypotension, amyloid heart disease, cardiomyopathy presents to the emergency department from the dialysis center the chief complaint of chest pain. Information obtained from the patient and the wife is at the bedside. He reports developing anterior chest pain during dialysis today. States the pain as sharp located left anterior not radiating. Denies sob, nausea, diaphoresis.  Reports being in usual state of health except for brief abdominal pain 2 days ago. Denies nausea/vomiting/diarrhea.  In ED patient febrile with 101.1, BP 69/50, HR 86. He was not hypoxic. CBC significant for hemoglobin of 10.6 relative neutrophils 95%, basic metabolic panel significant for creatinine of 5.49, alkaline phosphatase 221, BNP 32,097, lactic acid 4.6. Chest x-ray with no acute cardiopulmonary process. Emergency department he was given one dose of vancomycin one dose of Zosyn and a 500 mils bolus of normal saline.   Review of Systems:  10 point review of systems completed and all systems negative except as indicated in the history of present illness   Past Medical History  Diagnosis Date  . Renal disorder   . Hypotension   . Amyloid heart disease   . Cardiomyopathy   . Gout   . BPH (benign prostatic hypertrophy)   . ESRD (end stage renal disease) on dialysis    Past Surgical History  Procedure Laterality Date  . Cholecystectomy    . Hernia repair    . Av fistula placement    . Nephrectomy      left  . Colonoscopy  07/19/2012    Procedure: COLONOSCOPY;  Surgeon: Rogene Houston, MD;  Location: [facility];  Service: Endoscopy;  Laterality: N/A;  730   Social History:  reports that he quit smoking about 32  years ago. His smoking use included Cigarettes. He has a .5 pack-year smoking history. He does not have any smokeless tobacco history on file. He reports that he does not drink alcohol or use illicit drugs. Is married lives at home with his wife he is independent with ADLs No Known Allergies  History reviewed. No pertinent family history.   Prior to Admission medications   Medication Sig Start Date End Date Taking? Authorizing Provider  allopurinol (ZYLOPRIM) 100 MG tablet Take 100 mg by mouth daily.   Yes Historical Provider, MD  aspirin 81 MG tablet Take 81 mg by mouth daily.   Yes Historical Provider, MD  cinacalcet (SENSIPAR) 30 MG tablet Take 30 mg by mouth daily.    Yes Historical Provider, MD  metoCLOPramide (REGLAN) 10 MG tablet Take 10 mg by mouth Three times a day. 06/27/12  Yes Historical Provider, MD  midodrine (PROAMATINE) 10 MG tablet Take 20 mg by mouth 3 (three) times daily. 12/03/11  Yes Historical Provider, MD  multivitamin (RENA-VIT) TABS tablet Take 1 tablet by mouth daily.   Yes Historical Provider, MD  omeprazole (PRILOSEC) 20 MG capsule Take 20 mg by mouth 2 (two) times daily before a meal.   Yes Historical Provider, MD  sevelamer (RENVELA) 800 MG tablet Take 800 mg by mouth 3 (three) times daily with meals.   Yes Historical Provider, MD  Venlafaxine HCl 75 MG TB24 Take 75 mg by mouth 2 (two) times daily.   Yes Historical Provider, MD  Physical Exam: Filed Vitals:   05/09/14 1311  BP: 70/59  Pulse: 89  Temp: 101.1 F (38.4 C)  Resp: 14    BP 70/59  Pulse 89  Temp(Src) 101.1 F (38.4 C) (Oral)  Resp 14  Ht 5\' 8"  (1.727 m)  Wt 73.483 kg (162 lb)  BMI 24.64 kg/m2  SpO2 96%  General:  Appears calm, somewhat uncomfortable no acute distress Eyes: PERRL, normal lids, irises & conjunctiva ENT: Ears are clear nose without drainage oropharynx without erythema or exudate mucous membranes of his mouth are pink moist Neck: no LAD, masses or thyromegaly Cardiovascular:  RRR, no m/r/g.  No LE edema Telemetry: SR, no arrhythmias  Respiratory: mild increased work of breathing with conversation. Faint crackles bilateral bases Abdomen: somewhat distended but soft, non-tender to palpation. +BS Skin: no rash or induration seen on limited exam Musculoskeletal: grossly normal tone BUE/BLE Psychiatric: grossly normal mood and affect, speech fluent and appropriate Neurologic: grossly non-focal.          Labs on Admission:  Basic Metabolic Panel:  Recent Labs Lab 05/09/14 1100  NA 137  K 3.8  CL 93*  CO2 25  GLUCOSE 83  BUN 18  CREATININE 5.49*  CALCIUM 8.4   Liver Function Tests:  Recent Labs Lab 05/09/14 1100  AST 34  ALT 19  ALKPHOS 221*  BILITOT 0.9  PROT 9.2*  ALBUMIN 3.7   No results found for this basename: LIPASE, AMYLASE,  in the last 168 hours No results found for this basename: AMMONIA,  in the last 168 hours CBC:  Recent Labs Lab 05/09/14 1100  WBC 4.9  NEUTROABS 4.5  HGB 10.6*  HCT 31.4*  MCV 95.7  PLT 156   Cardiac Enzymes:  Recent Labs Lab 05/09/14 1100  TROPONINI <0.30    BNP (last 3 results)  Recent Labs  02/12/14 1139 05/09/14 1100  PROBNP 21129.0* 32097.0*   CBG: No results found for this basename: GLUCAP,  in the last 168 hours  Radiological Exams on Admission: Dg Chest Portable 1 View  05/09/2014   CLINICAL DATA:  Chest pain, end-stage renal disease, amyloid cardiomyopathy  EXAM: PORTABLE CHEST - 1 VIEW  COMPARISON:  Portable chest x-ray of February 12, 2014  FINDINGS: The lungs are adequately inflated. There is no focal infiltrate. The interstitial markings are mildly prominent though stable. The cardiac silhouette is top-normal in size but stable. There is no pleural effusion or significant pulmonary vascular and congestion. Two large caliber dialysis type catheters are in place via bilateral internal jugular approach. The tips lie in the mid and distal portions of the SVC. The bony thorax is  unremarkable.  IMPRESSION: There is no acute cardiopulmonary abnormality.   Electronically Signed   By: David  Martinique   On: 05/09/2014 11:12    EKG:   Assessment/Plan Principal Problem:   Sepsis: likely related to persistent bacteremia diagnosed and treated in 4/15. Will admit to ICU. Await blood cultures. Continue vanc and zosyn initiated in ED. He was given 541ml NS in ED and BP remains soft however, patient with hx hypotension and on midodrine. Lactic acid as above. He is non toxic appearing and not hypoxic. Of note, concern for persistent bacteremia given he has same dialysis catheter as before dx/treatment in April.  May need to have catheter removed. Await blood cultures.   Active Problems:  Chest pain: resolved at time of exam. Pt with hx of amyloid heart disease. Initial troponin negative. Will cycle troponin and repeat EKG.  Continue home meds. Monitor    Dyspnea: patient denies sob but is obviously mildly dyspneic with conversation. Chest xray without cardiopulmonary process. proBNP elevated in this ESRD patient. Not hypoxic. Some concern for PE. Will obtain CT chest rule out PE. Some concern for volume overload but he completed all but 30 minutes of dialysis today. Will obtain abdominal US evaluate for ascites.  Provide oxygen supplementation. Monitor sats   Hypotension: chronic hypotension documented. Patient tolerating well. Remains alert oriented. Denies dizziness, discomfort. Will resume home medications. Will hold off on further fluids until evaluated by nephrology.      ESRD (end stage renal disease): completed all but 30 minutes of dialysis today. Have request nephrology consult.      Amyloid heart disease: see #2. Continue home meds. Follow [facility] in [facility]   Dr. Gaston Islam nephrology  Code Status: full Family Communication: wife at bedside Disposition Plan: home when ready   Time spent: 92 minutes  [place] [facility] Pager [phone]  **Disclaimer: This note  may have been dictated with voice recognition software. Similar sounding words can inadvertently be transcribed and this note may contain transcription errors which may not have been corrected upon publication of note.**

## 2014-05-09 NOTE — Progress Notes (Signed)
ANTIBIOTIC CONSULT NOTE - INITIAL  Pharmacy Consult for Vancomycin and Zosyn Indication: rule out sepsis  No Known Allergies  Patient Measurements: Height: 5\' 8"  (172.7 cm) Weight: 162 lb (73.483 kg) IBW/kg (Calculated) : 68.4  Vital Signs: Temp: 99.2 F (37.3 C) (07/15 1412) Temp src: Oral (07/15 1412) BP: 68/53 mmHg (07/15 1430) Pulse Rate: 86 (07/15 1430) Intake/Output from previous day:   Intake/Output from this shift:    Labs:  Recent Labs  05/09/14 1100  WBC 4.9  HGB 10.6*  PLT 156  CREATININE 5.49*   Estimated Creatinine Clearance: 12.6 ml/min (by C-G formula based on Cr of 5.49). No results found for this basename: VANCOTROUGH, VANCOPEAK, VANCORANDOM, GENTTROUGH, GENTPEAK, GENTRANDOM, TOBRATROUGH, TOBRAPEAK, TOBRARND, AMIKACINPEAK, AMIKACINTROU, AMIKACIN,  in the last 72 hours   Microbiology: No results found for this or any previous visit (from the past 720 hour(s)).  Medical History: Past Medical History  Diagnosis Date  . Renal disorder   . Hypotension   . Amyloid heart disease   . Cardiomyopathy   . Gout   . BPH (benign prostatic hypertrophy)   . ESRD (end stage renal disease) on dialysis    Vancomycin 7/15 >> Zosyn 7/15 >>  Assessment: 68yo male with ESRD requiring dialysis.  Lactic acid is elevated.  Patient is afebrile.  Pt received Vancomycin 1000mg  and Zosyn 3.375gm IV on admission.    Goal of Therapy:  Pre-Hemodialysis Vancomycin level goal range =15-25 mcg/ml  Plan:  Vancomycin 1000mg  IV today x 1 (already given) F/U dialysis schedule for further doses Zosyn 2.25gm IV q8h starting tonight.   Monitor labs, renal fxn, and cultures  Hart Robinsons A 05/09/2014,3:14 PM

## 2014-05-09 NOTE — ED Notes (Addendum)
Additional 267ml bolus given from same IV bag as per verbal order from Dr Roderic Palau

## 2014-05-10 ENCOUNTER — Encounter (HOSPITAL_COMMUNITY): Payer: Self-pay

## 2014-05-10 ENCOUNTER — Inpatient Hospital Stay (HOSPITAL_COMMUNITY): Payer: Medicare Other

## 2014-05-10 DIAGNOSIS — R188 Other ascites: Secondary | ICD-10-CM | POA: Diagnosis present

## 2014-05-10 LAB — BODY FLUID CELL COUNT WITH DIFFERENTIAL
Eos, Fluid: 0 %
LYMPHS FL: 35 %
Monocyte-Macrophage-Serous Fluid: 19 % — ABNORMAL LOW (ref 50–90)
NEUTROPHIL FLUID: 46 % — AB (ref 0–25)
Total Nucleated Cell Count, Fluid: 622 cu mm (ref 0–1000)

## 2014-05-10 LAB — COMPREHENSIVE METABOLIC PANEL
ALT: 22 U/L (ref 0–53)
AST: 38 U/L — ABNORMAL HIGH (ref 0–37)
Albumin: 3.1 g/dL — ABNORMAL LOW (ref 3.5–5.2)
Alkaline Phosphatase: 235 U/L — ABNORMAL HIGH (ref 39–117)
Anion gap: 15 (ref 5–15)
BUN: 30 mg/dL — ABNORMAL HIGH (ref 6–23)
CO2: 27 mEq/L (ref 19–32)
Calcium: 7.9 mg/dL — ABNORMAL LOW (ref 8.4–10.5)
Chloride: 94 mEq/L — ABNORMAL LOW (ref 96–112)
Creatinine, Ser: 7.47 mg/dL — ABNORMAL HIGH (ref 0.50–1.35)
GFR calc Af Amer: 8 mL/min — ABNORMAL LOW (ref >90)
GFR calc non Af Amer: 7 mL/min — ABNORMAL LOW (ref >90)
Glucose, Bld: 85 mg/dL (ref 70–99)
Potassium: 4.4 mEq/L (ref 3.7–5.3)
Sodium: 136 mEq/L — ABNORMAL LOW (ref 137–147)
Total Bilirubin: 0.6 mg/dL (ref 0.3–1.2)
Total Protein: 7.9 g/dL (ref 6.0–8.3)

## 2014-05-10 LAB — CBC
HEMATOCRIT: 28.9 % — AB (ref 39.0–52.0)
Hemoglobin: 9.7 g/dL — ABNORMAL LOW (ref 13.0–17.0)
MCH: 32.1 pg (ref 26.0–34.0)
MCHC: 33.6 g/dL (ref 30.0–36.0)
MCV: 95.7 fL (ref 78.0–100.0)
Platelets: 146 10*3/uL — ABNORMAL LOW (ref 150–400)
RBC: 3.02 MIL/uL — ABNORMAL LOW (ref 4.22–5.81)
RDW: 14.3 % (ref 11.5–15.5)
WBC: 8.7 10*3/uL (ref 4.0–10.5)

## 2014-05-10 LAB — TROPONIN I

## 2014-05-10 LAB — ALBUMIN, FLUID (OTHER): Albumin, Fluid: 2.7 g/dL

## 2014-05-10 MED ORDER — VANCOMYCIN HCL IN DEXTROSE 1-5 GM/200ML-% IV SOLN
1000.0000 mg | INTRAVENOUS | Status: DC
Start: 2014-05-11 — End: 2014-05-11
  Administered 2014-05-11: 1000 mg via INTRAVENOUS
  Filled 2014-05-10 (×2): qty 200

## 2014-05-10 NOTE — Progress Notes (Signed)
Subjective: Interval History: has no complaint of  Nausea or vomiting. Patient denies any fever chills or sweating. Overall he states he'[name] feeling good. Patient also denies any difficulty breathing..  Objective: Vital signs in last 24 hours: Temp:  [97.9 F (36.6 C)-101.1 F (38.4 C)] 97.9 F (36.6 C) (07/16 0800) Pulse Rate:  [57-113] 81 (07/16 0800) Resp:  [0-29] 11 (07/16 0900) BP: (61-80)/(43-61) 78/61 mmHg (07/16 0900) SpO2:  [92 %-100 %] 100 % (07/16 0800) Weight:  [70.2 kg (154 lb 12.2 oz)-73.483 kg (162 lb)] 70.2 kg (154 lb 12.2 oz) (07/16 0500) Weight change:   Intake/Output from previous day: 07/15 0701 - 07/16 0700 In: 100 [IV Piggyback:100] Out: -  Intake/Output this shift:    Generally patient is alert in no apparent distress. HEENT exam no conjunctival pallor and none icterus. He has elevated JVD. Chest: Decreased breath sound bilaterally. Heart exam revealed regular rate and rhythm Abdomen: Distended, nontender and positive bowel sound. Extremities trace edema  Lab Results:  Recent Labs  05/09/14 1100 05/10/14 0450  WBC 4.9 8.7  HGB 10.6* 9.7*  HCT 31.4* 28.9*  PLT 156 146*   BMET:  Recent Labs  05/09/14 1100 05/10/14 0450  NA 137 136*  K 3.8 4.4  CL 93* 94*  CO2 25 27  GLUCOSE 83 85  BUN 18 30*  CREATININE 5.49* 7.47*  CALCIUM 8.4 7.9*   No results found for this basename: PTH,  in the last 72 hours Iron Studies: No results found for this basename: IRON, TIBC, TRANSFERRIN, FERRITIN,  in the last 72 hours  Studies/Results: Ct Angio Chest Pe W/cm &/or Wo Cm  05/09/2014   CLINICAL DATA:  Hypoxia.  Chest pain.  EXAM: CT ANGIOGRAPHY CHEST WITH CONTRAST  TECHNIQUE: Multidetector CT imaging of the chest was performed using the standard protocol during bolus administration of intravenous contrast. Multiplanar CT image reconstructions and MIPs were obtained to evaluate the vascular anatomy.  CONTRAST:  133mL OMNIPAQUE IOHEXOL 350 MG/ML SOLN   COMPARISON:  Chest x-ray 05/09/2014.  FINDINGS: Thoracic aorta is unremarkable. No evidence of aneurysm or dissection. Cardiomegaly. Coronary artery disease. Tiny pericardial effusion. Pulmonary arteries are normal.  No significant mediastinal or hilar adenopathy. Thoracic esophagus unremarkable.  Large airways patent. Bibasilar atelectasis. No focal pulmonary infiltrates. No evidence of mass. No pleural effusion or pneumothorax.  Prominent ascites. Omental soft tissue thickening is noted. This may be related to ascites however omental tumor cannot be excluded and CT of the abdomen pelvis should be considered for further evaluation. Cholecystectomy.  Thyroid unremarkable. No supraclavicular or axillary adenopathy. Bilateral IJ catheters are present. Degenerative changes thoracic spine.  Review of the MIP images confirms the above findings.  IMPRESSION: 1. Cardiomegaly. Coronary disease. Tiny pericardial effusion. No pulmonary embolus. 2. Prominent ascites. Omental soft tissue thickened is noted. CT of the abdomen and pelvis should be considered for further evaluation of these findings.   Electronically Signed   By: Marcello Moores  Register   On: 05/09/2014 15:17   US Abdomen Limited  05/09/2014   CLINICAL DATA:  End-stage renal disease.  EXAM: LIMITED ABDOMEN ULTRASOUND FOR ASCITES  TECHNIQUE: Limited ultrasound survey for ascites was performed in all four abdominal quadrants.  COMPARISON:  Ultrasound 06/01/2013.  FINDINGS: Moderate ascites noted throughout the abdomen.  IMPRESSION: Moderate diffuse ascites.   Electronically Signed   By: Marcello Moores  Register   On: 05/09/2014 15:29   Dg Chest Portable 1 View  05/09/2014   CLINICAL DATA:  Chest pain, end-stage renal disease,  amyloid cardiomyopathy  EXAM: PORTABLE CHEST - 1 VIEW  COMPARISON:  Portable chest x-ray of February 12, 2014  FINDINGS: The lungs are adequately inflated. There is no focal infiltrate. The interstitial markings are mildly prominent though stable. The  cardiac silhouette is top-normal in size but stable. There is no pleural effusion or significant pulmonary vascular and congestion. Two large caliber dialysis type catheters are in place via bilateral internal jugular approach. The tips lie in the mid and distal portions of the SVC. The bony thorax is unremarkable.  IMPRESSION: There is no acute cardiopulmonary abnormality.   Electronically Signed   By: David  Martinique   On: 05/09/2014 11:12    I have reviewed the patient'[name] current medications.  Assessment/Plan: Problem #1 chest pain: Patient had episode of chest pain on dialysis but presently feels okay. He denies any pain since he came from the unit. Problem #2 severe cardiomyopathy with low ejection fraction. He has elevated JVD and also ascites which is stable. 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 Problem #3 hypotension: Secondary to the above. Patient blood pressure ranged from systolic of 77'[name] to 14E. Patient on Midodrine without significant change. Problem #4 fever: Patient presently a febrile and his white blood cell count is normal. This year he had similar episode and during that time patient was found to have guaiac-negative staph and was treated with antibiotics. At this moment culture is pending, he had episode of fever but his white blood cell count is normal. Problem #5 anemia Problem #6 metabolic bone disease: Calcium is slightly low but phosphorus as this moment is not available. Plan: We'll hold Sensipar We'll make arrangements for patient to get dialysis tomorrow We'll check his CBC and basic metabolic panel in the morning.  LOS: 1 day   [name] 05/10/2014,9:44 AM

## 2014-05-10 NOTE — Progress Notes (Signed)
UR chart review completed.  

## 2014-05-10 NOTE — Care Management Note (Addendum)
    Page 1 of 1   05/15/2014     10:33:27 AM CARE MANAGEMENT NOTE 05/15/2014  Patient:  [name], [name]   Account Number:  [id number]  Date Initiated:  05/10/2014  Documentation initiated by:  Theophilus Kinds  Subjective/Objective Assessment:   Pt admitted from home with sepsis. Pt lives with his wife and will return home at discharge. Pt is independent with ADl's. Pt receives dialysis at [facility] in [place].     Action/Plan:   No CM needs noted.   Anticipated DC Date:  05/14/2014   Anticipated DC Plan:  [place]  CM consult      Choice offered to / List presented to:             Status of service:  Completed, signed off Medicare Important Message given?  YES (If response is "NO", the following Medicare IM given date fields will be blank) Date Medicare IM given:  05/11/2014 Medicare IM given by:  Christinia Gully C Date Additional Medicare IM given:  05/14/2014 Additional Medicare IM given by:  Theophilus Kinds  Discharge Disposition:  ACUTE TO ACUTE TRANS  Per UR Regulation:    If discussed at Long Length of Stay Meetings, dates discussed:   05/15/2014    Comments:  05/15/14 [place], RN BSN CM Pt to transfer to [facility] today for [employer].  05/14/14 [place], RN BSNCM Pt potential discharge today or tomorrow (depending on TEE). IM delivered. No CM needs noted as pt will discharge on po AB. 05/11/14 [place], RN BSN CM Pt potential discharge over the weekend. No Cm needs noted.  05/10/14 [place], RN BSN CM

## 2014-05-10 NOTE — Progress Notes (Signed)
ANTIBIOTIC CONSULT NOTE - follow up  Pharmacy Consult for Vancomycin and Zosyn Indication: rule out sepsis  No Known Allergies  Patient Measurements: Height: 5\' 6"  (167.6 cm) Weight: 154 lb 12.2 oz (70.2 kg) IBW/kg (Calculated) : 63.8  Vital Signs: Temp: 97.9 F (36.6 C) (07/16 0800) Temp src: Oral (07/16 0800) BP: 79/55 mmHg (07/16 1030) Pulse Rate: 82 (07/16 0930) Intake/Output from previous day: 07/15 0701 - 07/16 0700 In: 100 [IV Piggyback:100] Out: -  Intake/Output from this shift:    Labs:  Recent Labs  05/09/14 1100 05/10/14 0450  WBC 4.9 8.7  HGB 10.6* 9.7*  PLT 156 146*  CREATININE 5.49* 7.47*   Estimated Creatinine Clearance: 8.7 ml/min (by C-G formula based on Cr of 7.47). No results found for this basename: VANCOTROUGH, Corlis Leak, VANCORANDOM, GENTTROUGH, GENTPEAK, GENTRANDOM, TOBRATROUGH, TOBRAPEAK, TOBRARND, AMIKACINPEAK, AMIKACINTROU, AMIKACIN,  in the last 72 hours   Microbiology: Recent Results (from the past 720 hour(s))  CULTURE, BLOOD (ROUTINE X 2)     Status: None   Collection Time    05/09/14 11:13 AM      Result Value Ref Range Status   Specimen Description BLOOD RIGHT HAND   Final   Special Requests BOTTLES DRAWN AEROBIC ONLY 5CC   Final   Culture NO GROWTH 1 DAY   Final   Report Status PENDING   Incomplete  CULTURE, BLOOD (ROUTINE X 2)     Status: None   Collection Time    05/09/14 11:33 AM      Result Value Ref Range Status   Specimen Description BLOOD LEFT HAND   Final   Special Requests BOTTLES DRAWN AEROBIC AND ANAEROBIC Flowella   Final   Culture NO GROWTH 1 DAY   Final   Report Status PENDING   Incomplete  MRSA PCR SCREENING     Status: Abnormal   Collection Time    05/09/14  3:09 PM      Result Value Ref Range Status   MRSA by PCR POSITIVE (*) NEGATIVE Final   Comment:            The GeneXpert MRSA Assay (FDA     approved for NASAL specimens     only), is one component of a     comprehensive MRSA colonization   surveillance program. It is not     intended to diagnose MRSA     infection nor to guide or     monitor treatment for     MRSA infections.     RESULT CALLED TO, READ BACK BY AND VERIFIED WITH:     SCHONEWITZ,L. AT 8119 ON 05/09/2014 BY Elza Rafter.   Medical History: Past Medical History  Diagnosis Date  . Renal disorder   . Hypotension   . Amyloid heart disease   . Cardiomyopathy   . Gout   . BPH (benign prostatic hypertrophy)   . ESRD (end stage renal disease) on dialysis    Vancomycin 7/15 >> Zosyn 7/15 >>  Assessment: 68yo male with ESRD requiring dialysis.  Lactic acid is elevated.  Patient is afebrile.  Pt received Vancomycin 1000mg  and Zosyn 3.375gm IV on admission.  Plan for dialysis tomorrow (MWF)  Goal of Therapy:  Pre-Hemodialysis Vancomycin level goal range =15-25 mcg/ml  Plan:  Vancomycin 1000mg  IV after each dialysis (MWF) Check pre-dialysis vancomycin level when indicated. Zosyn 2.25gm IV q8h starting tonight.   Monitor labs, renal fxn, and cultures  Hart Robinsons A 05/10/2014,10:39 AM

## 2014-05-10 NOTE — Progress Notes (Signed)
[facility] PROGRESS NOTE  AKSHAJ BESANCON QIH:[id number] DOB: February 01, 1946 DOA: 05/09/2014 PCP: Glo Herring., MD  Assessment/Plan: 1. Sepsis. Etiology is not entirely clear. He does not have any significant infiltrate on chest imaging. The patient no longer makes any urine. He's not had any diarrhea. Does not have any skin lesions. Other source of infection could be his dialysis catheter. Blood cultures have been sent and are in process. He does have some ascites, but does not have any abdominal tenderness. We'll await results of cultures. Continue empiric antibiotics for now. 2. Chest pain. He has ruled out for ACS with negative cardiac markers. He's not had any further symptoms 3. End-stage renal disease on hemodialysis. Continue dialysis per nephrology. 4. Shortness of breath. Patient did have mild dyspnea on admission. CT scan of the chest was negative for pulmonary embolus. He was noted to have some ascites which could be contributing to his shortness of breath. Plans are for possible paracentesis. 5. Amyloidosis.  6. Hypotension, chronic. Patient is on midodrine. Apparently his systolic blood pressures are chronically in the 70s to 80s. He is asymptomatic with these blood pressures. We'll continue to follow clinically.  Code Status: full code Family Communication: discussed with patient and family at the bedside Disposition Plan: discharge home once improved   Consultants:  Nephrology  Procedures:    Antibiotics:  Vancomycin 7/15>>  Zosyn 7/15>>  HPI/Subjective: Patient denies any chest pain or shortness of breath  Objective: Filed Vitals:   05/10/14 0900  BP: 78/61  Pulse:   Temp:   Resp: 11    Intake/Output Summary (Last 24 hours) at 05/10/14 0944 Last data filed at 05/10/14 8756  Gross per 24 hour  Intake    100 ml  Output      0 ml  Net    100 ml   Filed Weights   05/09/14 1053 05/09/14 1527 05/10/14 0500  Weight: 73.483 kg (162 lb) 70.2 kg (154  lb 12.2 oz) 70.2 kg (154 lb 12.2 oz)    Exam:   General:  NAD  Cardiovascular: s1, s2, rrr  Respiratory: crackles at bases  Abdomen: soft, distended, nontender, bs+  Musculoskeletal: no edema bilaterally   Data Reviewed: Basic Metabolic Panel:  Recent Labs Lab 05/09/14 1100 05/10/14 0450  NA 137 136*  K 3.8 4.4  CL 93* 94*  CO2 25 27  GLUCOSE 83 85  BUN 18 30*  CREATININE 5.49* 7.47*  CALCIUM 8.4 7.9*   Liver Function Tests:  Recent Labs Lab 05/09/14 1100 05/10/14 0450  AST 34 38*  ALT 19 22  ALKPHOS 221* 235*  BILITOT 0.9 0.6  PROT 9.2* 7.9  ALBUMIN 3.7 3.1*   No results found for this basename: LIPASE, AMYLASE,  in the last 168 hours No results found for this basename: AMMONIA,  in the last 168 hours CBC:  Recent Labs Lab 05/09/14 1100 05/10/14 0450  WBC 4.9 8.7  NEUTROABS 4.5  --   HGB 10.6* 9.7*  HCT 31.4* 28.9*  MCV 95.7 95.7  PLT 156 146*   Cardiac Enzymes:  Recent Labs Lab 05/09/14 1100 05/09/14 1820 05/09/14 2352  TROPONINI <0.30 <0.30 <0.30   BNP (last 3 results)  Recent Labs  02/12/14 1139 05/09/14 1100  PROBNP 21129.0* 32097.0*   CBG: No results found for this basename: GLUCAP,  in the last 168 hours  Recent Results (from the past 240 hour(s))  MRSA PCR SCREENING     Status: Abnormal   Collection Time  05/09/14  3:09 PM      Result Value Ref Range Status   MRSA by PCR POSITIVE (*) NEGATIVE Final   Comment:            The GeneXpert MRSA Assay (FDA     approved for NASAL specimens     only), is one component of a     comprehensive MRSA colonization     surveillance program. It is not     intended to diagnose MRSA     infection nor to guide or     monitor treatment for     MRSA infections.     RESULT CALLED TO, READ BACK BY AND VERIFIED WITH:     SCHONEWITZ,L. AT 1845 ON 05/09/2014 BY BAUGHAM,M.     Studies: Ct Angio Chest Pe W/cm &/or Wo Cm  05/09/2014   CLINICAL DATA:  Hypoxia.  Chest pain.  EXAM: CT  ANGIOGRAPHY CHEST WITH CONTRAST  TECHNIQUE: Multidetector CT imaging of the chest was performed using the standard protocol during bolus administration of intravenous contrast. Multiplanar CT image reconstructions and MIPs were obtained to evaluate the vascular anatomy.  CONTRAST:  159mL OMNIPAQUE IOHEXOL 350 MG/ML SOLN  COMPARISON:  Chest x-ray 05/09/2014.  FINDINGS: Thoracic aorta is unremarkable. No evidence of aneurysm or dissection. Cardiomegaly. Coronary artery disease. Tiny pericardial effusion. Pulmonary arteries are normal.  No significant mediastinal or hilar adenopathy. Thoracic esophagus unremarkable.  Large airways patent. Bibasilar atelectasis. No focal pulmonary infiltrates. No evidence of mass. No pleural effusion or pneumothorax.  Prominent ascites. Omental soft tissue thickening is noted. This may be related to ascites however omental tumor cannot be excluded and CT of the abdomen pelvis should be considered for further evaluation. Cholecystectomy.  Thyroid unremarkable. No supraclavicular or axillary adenopathy. Bilateral IJ catheters are present. Degenerative changes thoracic spine.  Review of the MIP images confirms the above findings.  IMPRESSION: 1. Cardiomegaly. Coronary disease. Tiny pericardial effusion. No pulmonary embolus. 2. Prominent ascites. Omental soft tissue thickened is noted. CT of the abdomen and pelvis should be considered for further evaluation of these findings.   Electronically Signed   By: Marcello Moores  Register   On: 05/09/2014 15:17   US Abdomen Limited  05/09/2014   CLINICAL DATA:  End-stage renal disease.  EXAM: LIMITED ABDOMEN ULTRASOUND FOR ASCITES  TECHNIQUE: Limited ultrasound survey for ascites was performed in all four abdominal quadrants.  COMPARISON:  Ultrasound 06/01/2013.  FINDINGS: Moderate ascites noted throughout the abdomen.  IMPRESSION: Moderate diffuse ascites.   Electronically Signed   By: Marcello Moores  Register   On: 05/09/2014 15:29   Dg Chest Portable 1  View  05/09/2014   CLINICAL DATA:  Chest pain, end-stage renal disease, amyloid cardiomyopathy  EXAM: PORTABLE CHEST - 1 VIEW  COMPARISON:  Portable chest x-ray of February 12, 2014  FINDINGS: The lungs are adequately inflated. There is no focal infiltrate. The interstitial markings are mildly prominent though stable. The cardiac silhouette is top-normal in size but stable. There is no pleural effusion or significant pulmonary vascular and congestion. Two large caliber dialysis type catheters are in place via bilateral internal jugular approach. The tips lie in the mid and distal portions of the SVC. The bony thorax is unremarkable.  IMPRESSION: There is no acute cardiopulmonary abnormality.   Electronically Signed   By: David  [place]   On: 05/09/2014 11:12    Scheduled Meds: . allopurinol  100 mg Oral Daily  . aspirin EC  81 mg Oral Daily  . Chlorhexidine  Gluconate Cloth  6 each Topical [id number]  . cinacalcet  30 mg Oral Q breakfast  . docusate sodium  100 mg Oral BID  . enoxaparin (LOVENOX) injection  30 mg Subcutaneous Q24H  . metoCLOPramide  10 mg Oral TID AC  . midodrine  20 mg Oral TID AC  . multivitamin  1 tablet Oral Daily  . mupirocin ointment  1 application Nasal BID  . pantoprazole  40 mg Oral Daily  . piperacillin-tazobactam (ZOSYN)  IV  2.25 g Intravenous 3 times per day  . sevelamer carbonate  800 mg Oral TID WC  . sodium chloride  3 mL Intravenous Q12H  . venlafaxine  75 mg Oral BID WC   Continuous Infusions:   Principal Problem:   Sepsis Active Problems:   ESRD (end stage renal disease)   Hypotension   Chest pain   Tachycardia   Dyspnea   Amyloid heart disease   Ascites    Time spent: 53mins    Cotey Rakes  [facility] Pager [phone]. If 7PM-7AM, please contact night-coverage at [email or web address], password [facility] 05/10/2014, 9:44 AM  LOS: 1 day

## 2014-05-10 NOTE — Procedures (Signed)
PreOperative Dx: ESRD, ascites Postoperative Dx: ESRD, ascites Procedure:   US guided paracentesis Radiologist:  Thornton Papas Anesthesia:  9 ml of 1% lidocaine Specimen:  4660 ml of peach colored ascitic fluid EBL:   < 1 ml Complications: None

## 2014-05-11 ENCOUNTER — Encounter (HOSPITAL_COMMUNITY): Payer: Self-pay | Admitting: Internal Medicine

## 2014-05-11 DIAGNOSIS — K652 Spontaneous bacterial peritonitis: Secondary | ICD-10-CM | POA: Diagnosis present

## 2014-05-11 DIAGNOSIS — D631 Anemia in chronic kidney disease: Secondary | ICD-10-CM | POA: Diagnosis present

## 2014-05-11 DIAGNOSIS — E639 Nutritional deficiency, unspecified: Secondary | ICD-10-CM

## 2014-05-11 DIAGNOSIS — E8589 Other amyloidosis: Secondary | ICD-10-CM

## 2014-05-11 DIAGNOSIS — N039 Chronic nephritic syndrome with unspecified morphologic changes: Secondary | ICD-10-CM

## 2014-05-11 DIAGNOSIS — E889 Metabolic disorder, unspecified: Secondary | ICD-10-CM

## 2014-05-11 DIAGNOSIS — I43 Cardiomyopathy in diseases classified elsewhere: Secondary | ICD-10-CM

## 2014-05-11 LAB — CBC
HCT: 30.4 % — ABNORMAL LOW (ref 39.0–52.0)
Hemoglobin: 10.4 g/dL — ABNORMAL LOW (ref 13.0–17.0)
MCH: 32.4 pg (ref 26.0–34.0)
MCHC: 34.2 g/dL (ref 30.0–36.0)
MCV: 94.7 fL (ref 78.0–100.0)
PLATELETS: 168 10*3/uL (ref 150–400)
RBC: 3.21 MIL/uL — ABNORMAL LOW (ref 4.22–5.81)
RDW: 14.1 % (ref 11.5–15.5)
WBC: 7 10*3/uL (ref 4.0–10.5)

## 2014-05-11 LAB — BASIC METABOLIC PANEL
Anion gap: 19 — ABNORMAL HIGH (ref 5–15)
BUN: 41 mg/dL — ABNORMAL HIGH (ref 6–23)
CALCIUM: 8 mg/dL — AB (ref 8.4–10.5)
CO2: 23 mEq/L (ref 19–32)
CREATININE: 8.85 mg/dL — AB (ref 0.50–1.35)
Chloride: 94 mEq/L — ABNORMAL LOW (ref 96–112)
GFR, EST AFRICAN AMERICAN: 6 mL/min — AB (ref >90)
GFR, EST NON AFRICAN AMERICAN: 5 mL/min — AB (ref >90)
Glucose, Bld: 87 mg/dL (ref 70–99)
Potassium: 4.6 mEq/L (ref 3.7–5.3)
Sodium: 136 mEq/L — ABNORMAL LOW (ref 137–147)

## 2014-05-11 LAB — TSH: TSH: 1.56 u[IU]/mL (ref 0.350–4.500)

## 2014-05-11 LAB — HEPATITIS B SURFACE ANTIGEN: Hepatitis B Surface Ag: NEGATIVE

## 2014-05-11 MED ORDER — ALTEPLASE 2 MG IJ SOLR: 2.0000 mg | Freq: Once | INTRAMUSCULAR | Status: AC | PRN

## 2014-05-11 MED ORDER — HEPARIN SODIUM (PORCINE) 1000 UNIT/ML DIALYSIS
1000.0000 [IU] | INTRAMUSCULAR | Status: DC | PRN
  Filled 2014-05-11: qty 1

## 2014-05-11 MED ORDER — VANCOMYCIN HCL IN DEXTROSE 750-5 MG/150ML-% IV SOLN
750.0000 mg | INTRAVENOUS | Status: DC
  Filled 2014-05-11: qty 150

## 2014-05-11 MED ORDER — LIDOCAINE-PRILOCAINE 2.5-2.5 % EX CREA
1.0000 "application " | TOPICAL_CREAM | CUTANEOUS | Status: DC | PRN
  Filled 2014-05-11: qty 5

## 2014-05-11 MED ORDER — HEPARIN SODIUM (PORCINE) 1000 UNIT/ML DIALYSIS
20.0000 [IU]/kg | INTRAMUSCULAR | Status: DC | PRN
  Administered 2014-05-11 – 2014-05-14 (×2): 1400 [IU] via INTRAVENOUS_CENTRAL
  Filled 2014-05-11: qty 2

## 2014-05-11 MED ORDER — HEPARIN SODIUM (PORCINE) 1000 UNIT/ML DIALYSIS
500.0000 [IU] | INTRAMUSCULAR | Status: DC | PRN
  Filled 2014-05-11: qty 1

## 2014-05-11 MED ORDER — PENTAFLUOROPROP-TETRAFLUOROETH EX AERO
1.0000 "application " | INHALATION_SPRAY | CUTANEOUS | Status: DC | PRN
  Filled 2014-05-11: qty 103.5

## 2014-05-11 MED ORDER — SODIUM CHLORIDE 0.9 % IV SOLN: 100.0000 mL | INTRAVENOUS | Status: DC | PRN

## 2014-05-11 MED ORDER — NEPRO/CARBSTEADY PO LIQD: 237.0000 mL | ORAL | Status: DC | PRN

## 2014-05-11 MED ORDER — LIDOCAINE HCL (PF) 1 % IJ SOLN: 5.0000 mL | INTRAMUSCULAR | Status: DC | PRN

## 2014-05-11 MED ORDER — VANCOMYCIN HCL IN DEXTROSE 750-5 MG/150ML-% IV SOLN
750.0000 mg | INTRAVENOUS | Status: DC
  Administered 2014-05-11 – 2014-05-14 (×2): 750 mg via INTRAVENOUS
  Filled 2014-05-11 (×2): qty 150

## 2014-05-11 NOTE — Progress Notes (Addendum)
ANTIBIOTIC CONSULT NOTE - follow up  Pharmacy Consult for Vancomycin and Zosyn Indication: rule out sepsis  No Known Allergies  Patient Measurements: Height: 5\' 6"  (167.6 cm) Weight: 148 lb 5.9 oz (67.3 kg) IBW/kg (Calculated) : 63.8 (note discrepancy in weight since admission)  Vital Signs: Temp: 98.1 F (36.7 C) (07/17 0819) Temp src: Oral (07/17 0819) BP: 72/48 mmHg (07/17 1000) Pulse Rate: 78 (07/17 0300) Intake/Output from previous day: 07/16 0701 - 07/17 0700 In: 203 [P.O.:100; I.V.:3; IV Piggyback:100] Out: -  Intake/Output from this shift: Total I/O In: 240 [P.O.:240] Out: -   Labs:  Recent Labs  05/09/14 1100 05/10/14 0450 05/11/14 0418  WBC 4.9 8.7 7.0  HGB 10.6* 9.7* 10.4*  PLT 156 146* 168  CREATININE 5.49* 7.47* 8.85*   Estimated Creatinine Clearance: 7.3 ml/min (by C-G formula based on Cr of 8.85). No results found for this basename: VANCOTROUGH, Corlis Leak, VANCORANDOM, GENTTROUGH, GENTPEAK, GENTRANDOM, TOBRATROUGH, TOBRAPEAK, TOBRARND, AMIKACINPEAK, AMIKACINTROU, AMIKACIN,  in the last 72 hours   Microbiology: Recent Results (from the past 720 hour(s))  CULTURE, BLOOD (ROUTINE X 2)     Status: None   Collection Time    05/09/14 11:13 AM      Result Value Ref Range Status   Specimen Description BLOOD RIGHT HAND   Final   Special Requests BOTTLES DRAWN AEROBIC ONLY 5CC   Final   Culture NO GROWTH 1 DAY   Final   Report Status PENDING   Incomplete  CULTURE, BLOOD (ROUTINE X 2)     Status: None   Collection Time    05/09/14 11:33 AM      Result Value Ref Range Status   Specimen Description BLOOD LEFT HAND   Final   Special Requests BOTTLES DRAWN AEROBIC AND ANAEROBIC [place]   Final   Culture NO GROWTH 1 DAY   Final   Report Status PENDING   Incomplete  MRSA PCR SCREENING     Status: Abnormal   Collection Time    05/09/14  3:09 PM      Result Value Ref Range Status   MRSA by PCR POSITIVE (*) NEGATIVE Final   Comment:            The GeneXpert  MRSA Assay (FDA     approved for NASAL specimens     only), is one component of a     comprehensive MRSA colonization     surveillance program. It is not     intended to diagnose MRSA     infection nor to guide or     monitor treatment for     MRSA infections.     RESULT CALLED TO, READ BACK BY AND VERIFIED WITH:     SCHONEWITZ,L. AT 1845 ON 05/09/2014 BY BAUGHAM,M.  BODY FLUID CULTURE     Status: None   Collection Time    05/10/14 11:00 AM      Result Value Ref Range Status   Specimen Description PARACENTESIS FLUID   Final   Special Requests NONE   Final   Gram Stain     Final   Value: NO WBC SEEN     NO ORGANISMS SEEN     Performed at [facility]   Culture PENDING   Incomplete   Report Status PENDING   Incomplete   Medical History: Past Medical History  Diagnosis Date  . Hypotension   . Amyloid heart disease   . Cardiomyopathy   . Gout   . BPH (benign  prostatic hypertrophy)   . ESRD (end stage renal disease) on dialysis    Vancomycin 7/15 >> Zosyn 7/15 >>  Assessment: 68yo male with ESRD requiring dialysis.  Lactic acid is elevated.  Patient is afebrile.  Pt received Vancomycin 1000mg  and Zosyn 3.375gm IV on admission.  Dialysis schedule is MWF Patient is currently afebrile with normal WBC  Goal of Therapy:  Pre-Hemodialysis Vancomycin level goal range =15-25 mcg/ml  Plan:  Reduce Vancomycin 750mg  IV after each dialysis (MWF) Check pre-dialysis vancomycin level when indicated. Zosyn 2.25gm IV q8h  Monitor labs, renal fxn, and cultures Deescalate antibiotics when appropriate.    [place] Crane, Ronee Ranganathan A 05/11/2014,10:39 AM

## 2014-05-11 NOTE — Progress Notes (Signed)
[facility] PROGRESS NOTE  BRAYDON KULLMAN FVC:[id number] DOB: [birth year] DOA: 05/09/2014 PCP: Glo Herring., MD  Assessment/Plan: 1. Sepsis. Etiology is not entirely clear, but could be secondary to low-grade spontaneous bacterial peritonitis. Ascitic fluid cell count reviewed and it contains 622 WBCs with 46% as neutrophils. However, the Gram stain revealed no WBCs. Culture is pending. He does not have any significant infiltrate on chest imaging. The patient no longer makes any urine. He's not had any diarrhea. Does not have any skin lesions. Other source of infection could be his dialysis catheter. Blood cultures have been sent and are negative to date. Continue empiric antibiotics for now. 2. Chest pain. He has ruled out for ACS with negative cardiac markers. He has amyloidosis of the heart and has not had a recent echocardiogram. This will be ordered for interval assessment. (His echocardiogram in 2006 revealed an ejection fraction of 63%, grade 3 diastolic dysfunction, and restrictive physiology). He's not had any further symptoms 3. End-stage renal disease on hemodialysis. Continue dialysis per nephrology. 4. Shortness of breath. Patient did have mild dyspnea on admission. CT scan of the chest was negative for pulmonary embolus. He was noted to have some ascites which could have contributed to his shortness of breath. Status post paracentesis. He is no longer short of breath. 5. Abdominal ascites, likely secondary to volume overload from end-stage renal disease and possibly from amyloidosis. Only minimal elevation in AST. He has no history of liver disease. Status post paracentesis yielding 4.6 L of fluid on 7/16.. 6. Amyloidosis.  7. Hypotension, chronic. Patient is on midodrine. Apparently his systolic blood pressures are chronically in the 70s to 80s. He is asymptomatic with these blood pressures. This was confirmed by his longtime nephrologist, Dr. Lowanda Foster. We'll continue to follow  clinically. We'll order a TSH and a.m. cortisol level for further evaluation. 8. 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 Currently stable.  Code Status: full code Family Communication: Discussed with patient and wife. Disposition Plan: discharge home once improved   Consultants:  Nephrology  Procedures:  Ultrasound-guided paracentesis per radiology on 05/10/2014; yielding a 4660 mL.  Antibiotics:  Vancomycin 7/15>>  Zosyn 7/15>>  HPI/Subjective: Patient is sitting up in bed. He has no complaints of chest pain, shortness of breath, or abdominal pain.  Objective: Filed Vitals:   05/11/14 0819  BP:   Pulse:   Temp: 98.1 F (36.7 C)  Resp:    temperature 98.1. Pulse 78. Respiratory rate 17. Blood pressure on telemetry 83/50. Oxygen saturation 98% on room air.  Intake/Output Summary (Last 24 hours) at 05/11/14 0827 Last data filed at 05/10/14 2236  Gross per 24 hour  Intake    203 ml  Output      0 ml  Net    203 ml   Filed Weights   05/09/14 1527 05/10/14 0500 05/11/14 0500  Weight: 70.2 kg (154 lb 12.2 oz) 70.2 kg (154 lb 12.2 oz) 67.3 kg (148 lb 5.9 oz)    Exam:   General: Pleasant alert 68 year old African-American man in no acute distress.  Cardiovascular: S1, S2, with a soft systolic murmur.  Chest wall with dialysis catheter in place right upper chest wall with no surrounding erythema or tenderness.  Respiratory: Clear anteriorly with rare crackles in the bases. Breathing is nonlabored.  Abdomen: soft, positive bowel sounds, nontender, nondistended. No masses palpated.   Musculoskeletal: No acute hot red joints. No pedal edema.  Neurologic/psychiatric: He is alert and oriented x3.  Cranial nerves II through XII are grossly intact.   Data Reviewed: Basic Metabolic Panel:  Recent Labs Lab 05/09/14 1100 05/10/14 0450 05/11/14 0418  NA 137 136* 136*  K 3.8 4.4 4.6  CL 93* 94* 94*  CO2 25 27 23   GLUCOSE 83  85 87  BUN 18 30* 41*  CREATININE 5.49* 7.47* 8.85*  CALCIUM 8.4 7.9* 8.0*   Liver Function Tests:  Recent Labs Lab 05/09/14 1100 05/10/14 0450  AST 34 38*  ALT 19 22  ALKPHOS 221* 235*  BILITOT 0.9 0.6  PROT 9.2* 7.9  ALBUMIN 3.7 3.1*   No results found for this basename: LIPASE, AMYLASE,  in the last 168 hours No results found for this basename: AMMONIA,  in the last 168 hours CBC:  Recent Labs Lab 05/09/14 1100 05/10/14 0450 05/11/14 0418  WBC 4.9 8.7 7.0  NEUTROABS 4.5  --   --   HGB 10.6* 9.7* 10.4*  HCT 31.4* 28.9* 30.4*  MCV 95.7 95.7 94.7  PLT 156 146* 168   Cardiac Enzymes:  Recent Labs Lab 05/09/14 1100 05/09/14 1820 05/09/14 2352  TROPONINI <0.30 <0.30 <0.30   BNP (last 3 results)  Recent Labs  02/12/14 1139 05/09/14 1100  PROBNP 21129.0* 32097.0*   CBG: No results found for this basename: GLUCAP,  in the last 168 hours  Recent Results (from the past 240 hour(s))  CULTURE, BLOOD (ROUTINE X 2)     Status: None   Collection Time    05/09/14 11:13 AM      Result Value Ref Range Status   Specimen Description BLOOD RIGHT HAND   Final   Special Requests BOTTLES DRAWN AEROBIC ONLY 5CC   Final   Culture NO GROWTH 1 DAY   Final   Report Status PENDING   Incomplete  CULTURE, BLOOD (ROUTINE X 2)     Status: None   Collection Time    05/09/14 11:33 AM      Result Value Ref Range Status   Specimen Description BLOOD LEFT HAND   Final   Special Requests BOTTLES DRAWN AEROBIC AND ANAEROBIC 6CC   Final   Culture NO GROWTH 1 DAY   Final   Report Status PENDING   Incomplete  MRSA PCR SCREENING     Status: Abnormal   Collection Time    05/09/14  3:09 PM      Result Value Ref Range Status   MRSA by PCR POSITIVE (*) NEGATIVE Final   Comment:            The GeneXpert MRSA Assay (FDA     approved for NASAL specimens     only), is one component of a     comprehensive MRSA colonization     surveillance program. It is not     intended to diagnose MRSA      infection nor to guide or     monitor treatment for     MRSA infections.     RESULT CALLED TO, READ BACK BY AND VERIFIED WITH:     SCHONEWITZ,L. AT 1845 ON 05/09/2014 BY BAUGHAM,M.  BODY FLUID CULTURE     Status: None   Collection Time    05/10/14 11:00 AM      Result Value Ref Range Status   Specimen Description PARACENTESIS FLUID   Final   Special Requests NONE   Final   Gram Stain     Final   Value: NO WBC SEEN     NO ORGANISMS SEEN  Performed at [facility] PENDING   Incomplete   Report Status PENDING   Incomplete     Studies: Ct Angio Chest Pe W/cm &/or Wo Cm  05/09/2014   CLINICAL DATA:  Hypoxia.  Chest pain.  EXAM: CT ANGIOGRAPHY CHEST WITH CONTRAST  TECHNIQUE: Multidetector CT imaging of the chest was performed using the standard protocol during bolus administration of intravenous contrast. Multiplanar CT image reconstructions and MIPs were obtained to evaluate the vascular anatomy.  CONTRAST:  160mL OMNIPAQUE IOHEXOL 350 MG/ML SOLN  COMPARISON:  Chest x-ray 05/09/2014.  FINDINGS: Thoracic aorta is unremarkable. No evidence of aneurysm or dissection. Cardiomegaly. Coronary artery disease. Tiny pericardial effusion. Pulmonary arteries are normal.  No significant mediastinal or hilar adenopathy. Thoracic esophagus unremarkable.  Large airways patent. Bibasilar atelectasis. No focal pulmonary infiltrates. No evidence of mass. No pleural effusion or pneumothorax.  Prominent ascites. Omental soft tissue thickening is noted. This may be related to ascites however omental tumor cannot be excluded and CT of the abdomen pelvis should be considered for further evaluation. Cholecystectomy.  Thyroid unremarkable. No supraclavicular or axillary adenopathy. Bilateral IJ catheters are present. Degenerative changes thoracic spine.  Review of the MIP images confirms the above findings.  IMPRESSION: 1. Cardiomegaly. Coronary disease. Tiny pericardial effusion. No pulmonary  embolus. 2. Prominent ascites. Omental soft tissue thickened is noted. CT of the abdomen and pelvis should be considered for further evaluation of these findings.   Electronically Signed   By: Marcello Moores  Register   On: 05/09/2014 15:17   US Abdomen Limited  05/09/2014   CLINICAL DATA:  End-stage renal disease.  EXAM: LIMITED ABDOMEN ULTRASOUND FOR ASCITES  TECHNIQUE: Limited ultrasound survey for ascites was performed in all four abdominal quadrants.  COMPARISON:  Ultrasound 06/01/2013.  FINDINGS: Moderate ascites noted throughout the abdomen.  IMPRESSION: Moderate diffuse ascites.   Electronically Signed   By: Marcello Moores  Register   On: 05/09/2014 15:29   US Paracentesis  05/10/2014   CLINICAL DATA:  End-stage renal disease, ascites, shortness of breath  EXAM: ULTRASOUND GUIDED DIAGNOSTIC AND THERAPEUTIC PARACENTESIS  COMPARISON:  Limited abdomen ultrasound 05/09/2014  PROCEDURE: Procedure, benefits, and risks of procedure were discussed with patient.  Written informed consent for procedure was obtained.  Time out protocol followed.  Adequate collection of ascites localized in RIGHT lower quadrant.  Skin prepped and draped in usual sterile fashion.  Skin and soft tissues anesthetized with 9 mL of 1% lidocaine.  5 [place] Yueh catheter placed into peritoneal cavity.  4660 ml of peach colored fluid aspirated by vacuum bottle suction.  Procedure tolerated well by patient without immediate complication.  FINDINGS: A total of approximately 4660 mL of ascitic fluid was removed. A fluid sample of 180 mL was sent for laboratory analysis.  IMPRESSION: Successful ultrasound guided paracentesis yielding 4660 mL of ascites.   Electronically Signed   By: Lavonia Dana M.D.   On: 05/10/2014 11:50   Dg Chest Portable 1 View  05/09/2014   CLINICAL DATA:  Chest pain, end-stage renal disease, amyloid cardiomyopathy  EXAM: PORTABLE CHEST - 1 VIEW  COMPARISON:  Portable chest x-ray of February 12, 2014  FINDINGS: The lungs are adequately  inflated. There is no focal infiltrate. The interstitial markings are mildly prominent though stable. The cardiac silhouette is top-normal in size but stable. There is no pleural effusion or significant pulmonary vascular and congestion. Two large caliber dialysis type catheters are in place via bilateral internal jugular approach. The tips lie in  the mid and distal portions of the SVC. The bony thorax is unremarkable.  IMPRESSION: There is no acute cardiopulmonary abnormality.   Electronically Signed   By: David  Martin[place]   On: 05/09/2014 11:12    Scheduled Meds: . allopurinol  100 mg Oral Daily  . aspirin EC  81 mg Oral Daily  . Chlorhexidine Gluconate Cloth  6 each Topical Q0600  . docusate sodium  100 mg Oral BID  . enoxaparin (LOVENOX) injection  30 mg Subcutaneous Q24H  . metoCLOPramide  10 mg Oral TID AC  . midodrine  20 mg Oral TID AC  . multivitamin  1 tablet Oral Daily  . mupirocin ointment  1 application Nasal BID  . pantoprazole  40 mg Oral Daily  . piperacillin-tazobactam (ZOSYN)  IV  2.25 g Intravenous 3 times per day  . sevelamer carbonate  800 mg Oral TID WC  . sodium chloride  3 mL Intravenous Q12H  . vancomycin  1,000 mg Intravenous Q M,W,F-HD  . venlafaxine  75 mg Oral BID WC   Continuous Infusions:   Principal Problem:   Sepsis Active Problems:   ESRD (end stage renal disease)   Hypotension   Chest pain   Tachycardia   Dyspnea   Amyloid heart disease   Ascites   Anemia in chronic kidney disease(285.21)    Time spent: 34mins    Dallas Torok  [facility] Pager [phone]. If 7PM-7AM, please contact night-coverage at [email or web address], password [facility] 05/11/2014, 8:27 AM  LOS: 2 days

## 2014-05-11 NOTE — Progress Notes (Addendum)
Subjective: Interval Histosry: Patient offer no complaint and no chest pain.  Objective: Vital signs in last 24 hours: Temp:  [97.9 F (36.6 C)-98.3 F (36.8 C)] 97.9 F (36.6 C) (07/17 0400) Pulse Rate:  [76-140] 78 (07/17 0300) Resp:  [6-23] 17 (07/17 0600) BP: (59-83)/(38-61) 63/46 mmHg (07/17 0600) SpO2:  [93 %-100 %] 98 % (07/17 0400) Weight:  [67.3 kg (148 lb 5.9 oz)] 67.3 kg (148 lb 5.9 oz) (07/17 0500) Weight change: -6.183 kg (-13 lb 10.1 oz)  Intake/Output from previous day: 07/16 0701 - 07/17 0700 In: 203 [P.O.:100; I.V.:3; IV Piggyback:100] Out: -  Intake/Output this shift:    Generally patient is alert in no apparent distress. HEENT exam no conjunctival pallor and none icterus. He has elevated JVD. Chest: Decreased breath sound bilaterally. Heart exam revealed regular rate and rhythm Abdomen:  nontender and positive bowel sound. Extremities trace edema  Lab Results:  Recent Labs  05/10/14 0450 05/11/14 0418  WBC 8.7 7.0  HGB 9.7* 10.4*  HCT 28.9* 30.4*  PLT 146* 168   BMET:   Recent Labs  05/10/14 0450 05/11/14 0418  NA 136* 136*  K 4.4 4.6  CL 94* 94*  CO2 27 23  GLUCOSE 85 87  BUN 30* 41*  CREATININE 7.47* 8.85*  CALCIUM 7.9* 8.0*   No results found for this basename: PTH,  in the last 72 hours Iron Studies: No results found for this basename: IRON, TIBC, TRANSFERRIN, FERRITIN,  in the last 72 hours  Studies/Results: Ct Angio Chest Pe W/cm &/or Wo Cm  05/09/2014   CLINICAL DATA:  Hypoxia.  Chest pain.  EXAM: CT ANGIOGRAPHY CHEST WITH CONTRAST  TECHNIQUE: Multidetector CT imaging of the chest was performed using the standard protocol during bolus administration of intravenous contrast. Multiplanar CT image reconstructions and MIPs were obtained to evaluate the vascular anatomy.  CONTRAST:  147mL OMNIPAQUE IOHEXOL 350 MG/ML SOLN  COMPARISON:  Chest x-ray 05/09/2014.  FINDINGS: Thoracic aorta is unremarkable. No evidence of aneurysm or  dissection. Cardiomegaly. Coronary artery disease. Tiny pericardial effusion. Pulmonary arteries are normal.  No significant mediastinal or hilar adenopathy. Thoracic esophagus unremarkable.  Large airways patent. Bibasilar atelectasis. No focal pulmonary infiltrates. No evidence of mass. No pleural effusion or pneumothorax.  Prominent ascites. Omental soft tissue thickening is noted. This may be related to ascites however omental tumor cannot be excluded and CT of the abdomen pelvis should be considered for further evaluation. Cholecystectomy.  Thyroid unremarkable. No supraclavicular or axillary adenopathy. Bilateral IJ catheters are present. Degenerative changes thoracic spine.  Review of the MIP images confirms the above findings.  IMPRESSION: 1. Cardiomegaly. Coronary disease. Tiny pericardial effusion. No pulmonary embolus. 2. Prominent ascites. Omental soft tissue thickened is noted. CT of the abdomen and pelvis should be considered for further evaluation of these findings.   Electronically Signed   By: Marcello Moores  Register   On: 05/09/2014 15:17   US Abdomen Limited  05/09/2014   CLINICAL DATA:  End-stage renal disease.  EXAM: LIMITED ABDOMEN ULTRASOUND FOR ASCITES  TECHNIQUE: Limited ultrasound survey for ascites was performed in all four abdominal quadrants.  COMPARISON:  Ultrasound 06/01/2013.  FINDINGS: Moderate ascites noted throughout the abdomen.  IMPRESSION: Moderate diffuse ascites.   Electronically Signed   By: Marcello Moores  Register   On: 05/09/2014 15:29   US Paracentesis  05/10/2014   CLINICAL DATA:  End-stage renal disease, ascites, shortness of breath  EXAM: ULTRASOUND GUIDED DIAGNOSTIC AND THERAPEUTIC PARACENTESIS  COMPARISON:  Limited abdomen ultrasound 05/09/2014  PROCEDURE: Procedure, benefits, and risks of procedure were discussed with patient.  Written informed consent for procedure was obtained.  Time out protocol followed.  Adequate collection of ascites localized in RIGHT lower quadrant.   Skin prepped and draped in usual sterile fashion.  Skin and soft tissues anesthetized with 9 mL of 1% lidocaine.  5 [place] Yueh catheter placed into peritoneal cavity.  4660 ml of peach colored fluid aspirated by vacuum bottle suction.  Procedure tolerated well by patient without immediate complication.  FINDINGS: A total of approximately 4660 mL of ascitic fluid was removed. A fluid sample of 180 mL was sent for laboratory analysis.  IMPRESSION: Successful ultrasound guided paracentesis yielding 4660 mL of ascites.   Electronically Signed   By: Lavonia Dana M.D.   On: 05/10/2014 11:50   Dg Chest Portable 1 View  05/09/2014   CLINICAL DATA:  Chest pain, end-stage renal disease, amyloid cardiomyopathy  EXAM: PORTABLE CHEST - 1 VIEW  COMPARISON:  Portable chest x-ray of February 12, 2014  FINDINGS: The lungs are adequately inflated. There is no focal infiltrate. The interstitial markings are mildly prominent though stable. The cardiac silhouette is top-normal in size but stable. There is no pleural effusion or significant pulmonary vascular and congestion. Two large caliber dialysis type catheters are in place via bilateral internal jugular approach. The tips lie in the mid and distal portions of the SVC. The bony thorax is unremarkable.  IMPRESSION: There is no acute cardiopulmonary abnormality.   Electronically Signed   By: David  Martinique   On: 05/09/2014 11:12    I have reviewed the patient's current medications.  Assessment/Plan: Problem #1 chest pain: Patient had episode of chest pain on dialysis since then no chest pain Problem #2 severe cardiomyopathy with low ejection fraction. S/p Parasynthesis  Yesterday.No difficulty in breathing Problem #3 hypotension: Secondary to the above. Patient blood pressure ranged from systolic of 36'R to 44R. Patient on Midodrine without significant change. Problem #4 fever: Patient presently a febrile and his white blood cell count is normal. Blood culture no  growth Problem #5 anemia: His hemoglobin is with in our target range Problem #6 metabolic bone disease: Calcium is slightly low but phosphorus as this moment is not available. Plan: We will dialyze patient today We'll check his CBC and basic metabolic panel in the morning.  LOS: 2 days   Lalonnie Shaffer S 05/11/2014,7:40 AM

## 2014-05-11 NOTE — Procedures (Signed)
   HEMODIALYSIS TREATMENT NOTE:  3.5 hour low-heparin dialysis completed via right chest tunneled catheter.  Exit site unremarkable.  Goal met:  Tolerated removal of 2.5 liters without interruption in ultrafiltration.  SBP 70s-80s, pt's baseline.  All blood was reinfused.  Unable to read fill volumes on HD catheter.  Discussed with outpatient center charge nurse who stated cath is locked with normal saline.  Both ports were flushed with 10cc NS post treatment and capped/clamped.  Report given to Terence Lux, RN.  Kaylon Hitz L. Keriann Rankin, RN, CDN

## 2014-05-11 NOTE — Clinical Documentation Improvement (Signed)
Possible Clinical Condition for greater Specificity of Anemia?    Aplastic anemia Precipitous drop in Hematocrit  Anemia of Chronic Disease   Other Condition Cannot Clinically Determine   Supporting Information: ESRD,  Pt on EPO, Hypotension  Problem #5 anemia  Problem #6 metabolic bone disease  Diagnostics:HGB 10.6    9.7*  HCT 31.4*    28.9*   Treatments:  Monitoring CBC  Thank You, Philippa Chester ,RN Clinical Documentation Specialist:  [employer]

## 2014-05-12 DIAGNOSIS — K652 Spontaneous bacterial peritonitis: Secondary | ICD-10-CM

## 2014-05-12 DIAGNOSIS — I369 Nonrheumatic tricuspid valve disorder, unspecified: Secondary | ICD-10-CM

## 2014-05-12 LAB — BASIC METABOLIC PANEL
ANION GAP: 15 (ref 5–15)
BUN: 21 mg/dL (ref 6–23)
CALCIUM: 8.5 mg/dL (ref 8.4–10.5)
CO2: 25 meq/L (ref 19–32)
CREATININE: 6.53 mg/dL — AB (ref 0.50–1.35)
Chloride: 95 mEq/L — ABNORMAL LOW (ref 96–112)
GFR calc Af Amer: 9 mL/min — ABNORMAL LOW (ref >90)
GFR, EST NON AFRICAN AMERICAN: 8 mL/min — AB (ref >90)
Glucose, Bld: 87 mg/dL (ref 70–99)
Potassium: 4.4 mEq/L (ref 3.7–5.3)
Sodium: 135 mEq/L — ABNORMAL LOW (ref 137–147)

## 2014-05-12 LAB — CBC
HCT: 29.1 % — ABNORMAL LOW (ref 39.0–52.0)
HEMOGLOBIN: 10.1 g/dL — AB (ref 13.0–17.0)
MCH: 32.8 pg (ref 26.0–34.0)
MCHC: 34.7 g/dL (ref 30.0–36.0)
MCV: 94.5 fL (ref 78.0–100.0)
Platelets: 183 10*3/uL (ref 150–400)
RBC: 3.08 MIL/uL — ABNORMAL LOW (ref 4.22–5.81)
RDW: 14.2 % (ref 11.5–15.5)
WBC: 6 10*3/uL (ref 4.0–10.5)

## 2014-05-12 LAB — CORTISOL-AM, BLOOD: Cortisol - AM: 12.2 ug/dL (ref 4.3–22.4)

## 2014-05-12 LAB — PHOSPHORUS: PHOSPHORUS: 3.7 mg/dL (ref 2.3–4.6)

## 2014-05-12 NOTE — Progress Notes (Signed)
Subjective: Interval Histosry: Patient denies any difficulty in breathing. Overall he feels good. He doesn't have any nausea no vomiting and appetite is good. He doesn't have also any more of chest pain.  Objective: Vital signs in last 24 hours: Temp:  [97.6 F (36.4 C)-98.4 F (36.9 C)] 98.4 F (36.9 C) (07/18 0750) Pulse Rate:  [44-96] 73 (07/18 0410) Resp:  [10-32] 19 (07/18 0800) BP: (66-99)/(39-68) 88/41 mmHg (07/18 0700) SpO2:  [80 %-100 %] 99 % (07/18 0410) Weight:  [65.3 kg (143 lb 15.4 oz)-69.9 kg (154 lb 1.6 oz)] 65.3 kg (143 lb 15.4 oz) (07/18 0536) Weight change: 2.6 kg (5 lb 11.7 oz)  Intake/Output from previous day: 07/17 0701 - 07/18 0700 In: 1010 [P.O.:560; IV Piggyback:450] Out: 2500  Intake/Output this shift: Total I/O In: 403 [P.O.:400; I.V.:3] Out: -   Generally patient is alert in no apparent distress. HEENT exam no conjunctival pallor and none icterus. He has elevated JVD. Chest: Decreased breath sound bilaterally. Heart exam revealed regular rate and rhythm Abdomen:  nontender and positive bowel sound. Extremities trace edema  Lab Results:  Recent Labs  05/11/14 0418 05/12/14 0524  WBC 7.0 6.0  HGB 10.4* 10.1*  HCT 30.4* 29.1*  PLT 168 183   BMET:   Recent Labs  05/11/14 0418 05/12/14 0524  NA 136* 135*  K 4.6 4.4  CL 94* 95*  CO2 23 25  GLUCOSE 87 87  BUN 41* 21  CREATININE 8.85* 6.53*  CALCIUM 8.0* 8.5   No results found for this basename: PTH,  in the last 72 hours Iron Studies: No results found for this basename: IRON, TIBC, TRANSFERRIN, FERRITIN,  in the last 72 hours  Studies/Results: US Paracentesis  05/10/2014   CLINICAL DATA:  End-stage renal disease, ascites, shortness of breath  EXAM: ULTRASOUND GUIDED DIAGNOSTIC AND THERAPEUTIC PARACENTESIS  COMPARISON:  Limited abdomen ultrasound 05/09/2014  PROCEDURE: Procedure, benefits, and risks of procedure were discussed with patient.  Written informed consent for procedure was  obtained.  Time out protocol followed.  Adequate collection of ascites localized in RIGHT lower quadrant.  Skin prepped and draped in usual sterile fashion.  Skin and soft tissues anesthetized with 9 mL of 1% lidocaine.  5 [place] Yueh catheter placed into peritoneal cavity.  4660 ml of peach colored fluid aspirated by vacuum bottle suction.  Procedure tolerated well by patient without immediate complication.  FINDINGS: A total of approximately 4660 mL of ascitic fluid was removed. A fluid sample of 180 mL was sent for laboratory analysis.  IMPRESSION: Successful ultrasound guided paracentesis yielding 4660 mL of ascites.   Electronically Signed   By: Lavonia Dana M.D.   On: 05/10/2014 11:50    I have reviewed the patient's current medications.  Assessment/Plan: Problem #1 chest pain: Patient is presently asymptomatic. Problem #2 severe cardiomyopathy with low ejection fraction. S/p Parasynthesis  . Patient denies any difficulty increasing. Problem #3 hypotension: Secondary to the above. Patient blood pressure ranged from systolic of 16'X to 09U. Patient on Midodrine without significant change. Problem #4 fever: Patient presently afebrile and his white blood cell count is normal. Blood culture no growth. Source of infection as this moment is not clear. Since patient has some leukocyte in his peritneal  fluid possibility of spontaneous peritonitis is being entertained. Patient is on antibiotics. Problem #5 anemia: His hemoglobin is with in our target range Problem #6 metabolic bone disease: His calcium and phosphorus in the range. Plan: We will continue his present management and  follow patient.  LOS: 3 days   Kirandeep Fariss S 05/12/2014,9:49 AM

## 2014-05-12 NOTE — Progress Notes (Signed)
[facility] PROGRESS NOTE  [name] ZOX:[id number] DOB: May 22, 1946 DOA: 05/09/2014 PCP: Glo Herring., MD  Assessment/Plan: 1. Sepsis. Etiology is not entirely clear, but could be secondary to low-grade spontaneous bacterial peritonitis. Ascitic fluid cell count reviewed and it contains 622 WBCs with 46% as neutrophils. However, the Gram stain revealed no WBCs. Culture is pending. He does not have any significant infiltrate on chest imaging. The patient no longer makes any urine. He's not had any diarrhea. Does not have any skin lesions. Other source of infection could be his dialysis catheter. Blood cultures have been sent and are negative to date. Continue empiric antibiotics for now. If cultures remain negative, would transition antibiotic therapy to oral Cipro. 2. Chest pain. He has ruled out for ACS with negative cardiac markers. He has amyloidosis of the heart and has not had a recent echocardiogram. 2-D echocardiogram ordered yesterday, but not done. Hopefully will be done today for interval assessment. (His echocardiogram in 2006 revealed an ejection fraction of 81%, grade 3 diastolic dysfunction, and restrictive physiology). He's not had any further symptoms 3. End-stage renal disease on hemodialysis. Continue dialysis per nephrology. 4. Shortness of breath. Patient did have mild dyspnea on admission. CT scan of the chest was negative for pulmonary embolus. He was noted to have some ascites which could have contributed to his shortness of breath. Status post paracentesis. He is no longer short of breath. 5. Abdominal ascites, likely secondary to volume overload from end-stage renal disease and possibly from amyloidosis. Only minimal elevation in AST. He has no history of liver disease. Status post paracentesis yielding 4.6 L of fluid on 7/16.. 6. Amyloidosis.  7. Hypotension, chronic. Patient is on midodrine. Apparently his systolic blood pressures are chronically in the 70s to  80s. He is asymptomatic with these blood pressures. This was confirmed by his  nephrologist, Dr. Lowanda Foster. We'll continue to follow clinically. His TSH is within normal limits and his blood cortisol level is pending for further evaluation.  8. 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 Currently stable.  Code Status: full code Family Communication: Discussed with patient and wife. Disposition Plan: discharge home once improved, possibly in the next 24-48 hours.   Consultants:  Nephrology  Procedures:  Ultrasound-guided paracentesis per radiology on 05/10/2014; yielding a 4660 mL.  Antibiotics:  Vancomycin 7/15>>  Zosyn 7/15>>  HPI/Subjective: Patient is sitting up in bed. He has no complaints of chest pain, shortness of breath, or abdominal pain.  Objective: Filed Vitals:   05/12/14 0800  BP:   Pulse:   Temp:   Resp: 19   temperature 98.4. Pulse 73. Respiratory rate 19. Blood pressure  88/41. Oxygen saturation 99% on room air.  Intake/Output Summary (Last 24 hours) at 05/12/14 0924 Last data filed at 05/12/14 0800  Gross per 24 hour  Intake   1170 ml  Output   2500 ml  Net  -1330 ml   Filed Weights   05/11/14 0500 05/11/14 1450 05/12/14 0536  Weight: 67.3 kg (148 lb 5.9 oz) 69.9 kg (154 lb 1.6 oz) 65.3 kg (143 lb 15.4 oz)    Exam:   General: Pleasant alert 68 year old African-American man in no acute distress.  Cardiovascular: S1, S2, with a soft systolic murmur.  Chest wall with dialysis catheter in place right upper chest wall with no surrounding erythema or tenderness.  Respiratory: Clear anteriorly with decreased breath sounds in the bases. Breathing is nonlabored.  Abdomen: soft, positive bowel sounds, nontender, nondistended.  No masses palpated.   Musculoskeletal: No acute hot red joints. No pedal edema.  Neurologic/psychiatric: He is alert and oriented x3. Cranial nerves II through XII are grossly intact.   Data  Reviewed: Basic Metabolic Panel:  Recent Labs Lab 05/09/14 1100 05/10/14 0450 05/11/14 0418 05/12/14 0524  NA 137 136* 136* 135*  K 3.8 4.4 4.6 4.4  CL 93* 94* 94* 95*  CO2 25 27 23 25   GLUCOSE 83 85 87 87  BUN 18 30* 41* 21  CREATININE 5.49* 7.47* 8.85* 6.53*  CALCIUM 8.4 7.9* 8.0* 8.5  PHOS  --   --   --  3.7   Liver Function Tests:  Recent Labs Lab 05/09/14 1100 05/10/14 0450  AST 34 38*  ALT 19 22  ALKPHOS 221* 235*  BILITOT 0.9 0.6  PROT 9.2* 7.9  ALBUMIN 3.7 3.1*   No results found for this basename: LIPASE, AMYLASE,  in the last 168 hours No results found for this basename: AMMONIA,  in the last 168 hours CBC:  Recent Labs Lab 05/09/14 1100 05/10/14 0450 05/11/14 0418 05/12/14 0524  WBC 4.9 8.7 7.0 6.0  NEUTROABS 4.5  --   --   --   HGB 10.6* 9.7* 10.4* 10.1*  HCT 31.4* 28.9* 30.4* 29.1*  MCV 95.7 95.7 94.7 94.5  PLT 156 146* 168 183   Cardiac Enzymes:  Recent Labs Lab 05/09/14 1100 05/09/14 1820 05/09/14 2352  TROPONINI <0.30 <0.30 <0.30   BNP (last 3 results)  Recent Labs  02/12/14 1139 05/09/14 1100  PROBNP 21129.0* 32097.0*   CBG: No results found for this basename: GLUCAP,  in the last 168 hours  Recent Results (from the past 240 hour(s))  CULTURE, BLOOD (ROUTINE X 2)     Status: None   Collection Time    05/09/14 11:13 AM      Result Value Ref Range Status   Specimen Description BLOOD RIGHT HAND   Final   Special Requests BOTTLES DRAWN AEROBIC ONLY 5CC   Final   Culture NO GROWTH 2 DAYS   Final   Report Status PENDING   Incomplete  CULTURE, BLOOD (ROUTINE X 2)     Status: None   Collection Time    05/09/14 11:33 AM      Result Value Ref Range Status   Specimen Description BLOOD LEFT HAND   Final   Special Requests BOTTLES DRAWN AEROBIC AND ANAEROBIC 6CC   Final   Culture NO GROWTH 2 DAYS   Final   Report Status PENDING   Incomplete  MRSA PCR SCREENING     Status: Abnormal   Collection Time    05/09/14  3:09 PM       Result Value Ref Range Status   MRSA by PCR POSITIVE (*) NEGATIVE Final   Comment:            The GeneXpert MRSA Assay (FDA     approved for NASAL specimens     only), is one component of a     comprehensive MRSA colonization     surveillance program. It is not     intended to diagnose MRSA     infection nor to guide or     monitor treatment for     MRSA infections.     RESULT CALLED TO, READ BACK BY AND VERIFIED WITH:     SCHONEWITZ,L. AT 1845 ON 05/09/2014 BY BAUGHAM,M.  BODY FLUID CULTURE     Status: None   Collection Time  05/10/14 11:00 AM      Result Value Ref Range Status   Specimen Description PARACENTESIS FLUID   Final   Special Requests NONE   Final   Gram Stain     Final   Value: NO WBC SEEN     NO ORGANISMS SEEN     Performed at [facility]   Culture     Final   Value: NO GROWTH 1 DAY     Performed at [facility]   Report Status PENDING   Incomplete     Studies: US Paracentesis  05/10/2014   CLINICAL DATA:  End-stage renal disease, ascites, shortness of breath  EXAM: ULTRASOUND GUIDED DIAGNOSTIC AND THERAPEUTIC PARACENTESIS  COMPARISON:  Limited abdomen ultrasound 05/09/2014  PROCEDURE: Procedure, benefits, and risks of procedure were discussed with patient.  Written informed consent for procedure was obtained.  Time out protocol followed.  Adequate collection of ascites localized in RIGHT lower quadrant.  Skin prepped and draped in usual sterile fashion.  Skin and soft tissues anesthetized with 9 mL of 1% lidocaine.  5 [place] Yueh catheter placed into peritoneal cavity.  4660 ml of peach colored fluid aspirated by vacuum bottle suction.  Procedure tolerated well by patient without immediate complication.  FINDINGS: A total of approximately 4660 mL of ascitic fluid was removed. A fluid sample of 180 mL was sent for laboratory analysis.  IMPRESSION: Successful ultrasound guided paracentesis yielding 4660 mL of ascites.   Electronically Signed   By: Lavonia Dana M.D.   On: 05/10/2014 11:50    Scheduled Meds: . allopurinol  100 mg Oral Daily  . aspirin EC  81 mg Oral Daily  . Chlorhexidine Gluconate Cloth  6 each Topical Q0600  . docusate sodium  100 mg Oral BID  . enoxaparin (LOVENOX) injection  30 mg Subcutaneous Q24H  . metoCLOPramide  10 mg Oral TID AC  . midodrine  20 mg Oral TID AC  . multivitamin  1 tablet Oral Daily  . mupirocin ointment  1 application Nasal BID  . pantoprazole  40 mg Oral Daily  . piperacillin-tazobactam (ZOSYN)  IV  2.25 g Intravenous 3 times per day  . sevelamer carbonate  800 mg Oral TID WC  . sodium chloride  3 mL Intravenous Q12H  . vancomycin  750 mg Intravenous Q M,W,F-HD  . venlafaxine  75 mg Oral BID WC   Continuous Infusions:   Principal Problem:   Sepsis Active Problems:   ESRD (end stage renal disease)   Hypotension   Chest pain   Tachycardia   Dyspnea   Amyloid heart disease   Ascites   Anemia in chronic kidney disease(285.21)   SBP (spontaneous bacterial peritonitis)    Time spent: 34mins    Dravyn Severs  [facility] Pager [phone]. If 7PM-7AM, please contact night-coverage at [email or web address], password [email or web address] 05/12/2014, 9:24 AM  LOS: 3 days

## 2014-05-12 NOTE — Progress Notes (Signed)
Addendum:  Cardiologist, Dr. Harl Bowie called me to discuss the findings of the patient's 2-D echocardiogram:  In essence, there was a large calcified semi-mobile mass located in the right atrium. Differential diagnosis includes calcified thrombus or cardiac tumor, less likely vegetation. Therefore, cardiology will be consulted for 7/20 for potential TEE. The patient will need to be n.p.o. after midnight tomorrow night. This was discussed with the patient and his family.  Study Conclusions - Left ventricle: The cavity size was normal. Wall thickness was increased in a pattern of moderate to severe LVH. Systolic function was normal. The estimated ejection fraction was in the range of 55% to 60%. Wall motion was normal; there were no regional wall motion abnormalities. Features are consistent with a pseudonormal left ventricular filling pattern, with concomitant abnormal relaxation and increased filling pressure (grade 2 diastolic dysfunction). - Aortic valve: Mildly calcified annulus. Trileaflet; mildly thickened leaflets. There was mild regurgitation. Valve area (VTI): 2.98 cm^2. Valve area (Vmax): 2.53 cm^2. - Mitral valve: Mildly calcified annulus. Mildly thickened leaflets . - Left atrium: The atrium was severely dilated. - Right ventricle: The cavity size was moderately dilated. RV TAPSE is 1.3 cm. Systolic function was mildly to moderately reduced. - Right atrium: The atrium was severely dilated. 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 The exact attachment point is unclear, but appears to be attached to either the superior RA wall or potentially originating from the IVC. Differential includes calcified thrombus or cardiac tumor. Consider TEE for further evaluation. - Atrial septum: No defect or patent foramen ovale was identified. - Tricuspid valve: There was moderate-severe regurgitation. TR vena contracta is 0.7 cm. - Pulmonary arteries: PA peak pressure:  25 mm Hg (S). PASP is likely underestimated in the setting of significant TR and RV dysfunction.

## 2014-05-12 NOTE — Progress Notes (Addendum)
Pt moved to room 328 with ICU RN to care for pt until on floor  Pt condition stable

## 2014-05-12 NOTE — Progress Notes (Signed)
Echocardiogram 2D Echocardiogram has been performed.  [name] 05/12/2014, 11:37 AM

## 2014-05-12 NOTE — Plan of Care (Signed)
Problem: Phase I Progression Outcomes Goal: Pain controlled with appropriate interventions Outcome: Progressing Denies pain  Goal: OOB as tolerated unless otherwise ordered Outcome: Adequate for Discharge OOB to chair and ambulates in the room with minimal assistance Goal: Initial discharge plan identified Outcome: Completed/Met Date Met:  05/12/14 Home with family Goal: Hemodynamically stable Outcome: Progressing Blood pressure was previously low which has now resolved SBp 80's and 90's

## 2014-05-13 LAB — CBC
HCT: 32.1 % — ABNORMAL LOW (ref 39.0–52.0)
Hemoglobin: 10.9 g/dL — ABNORMAL LOW (ref 13.0–17.0)
MCH: 32.2 pg (ref 26.0–34.0)
MCHC: 34 g/dL (ref 30.0–36.0)
MCV: 95 fL (ref 78.0–100.0)
PLATELETS: 201 10*3/uL (ref 150–400)
RBC: 3.38 MIL/uL — ABNORMAL LOW (ref 4.22–5.81)
RDW: 14.2 % (ref 11.5–15.5)
WBC: 5.7 10*3/uL (ref 4.0–10.5)

## 2014-05-13 LAB — BASIC METABOLIC PANEL
Anion gap: 16 — ABNORMAL HIGH (ref 5–15)
BUN: 35 mg/dL — ABNORMAL HIGH (ref 6–23)
CALCIUM: 9 mg/dL (ref 8.4–10.5)
CO2: 24 mEq/L (ref 19–32)
Chloride: 94 mEq/L — ABNORMAL LOW (ref 96–112)
Creatinine, Ser: 7.89 mg/dL — ABNORMAL HIGH (ref 0.50–1.35)
GFR calc Af Amer: 7 mL/min — ABNORMAL LOW (ref >90)
GFR, EST NON AFRICAN AMERICAN: 6 mL/min — AB (ref >90)
GLUCOSE: 83 mg/dL (ref 70–99)
Potassium: 5 mEq/L (ref 3.7–5.3)
SODIUM: 134 meq/L — AB (ref 137–147)

## 2014-05-13 LAB — BODY FLUID CULTURE
Culture: NO GROWTH
Gram Stain: NONE SEEN

## 2014-05-13 NOTE — Progress Notes (Signed)
Subjective: Interval Histosry: Patient feels okay and no new complaints.  Objective: Vital signs in last 24 hours: Temp:  [97.6 F (36.4 C)-98.3 F (36.8 C)] 97.9 F (36.6 C) (07/19 0552) Pulse Rate:  [68-75] 68 (07/19 0552) Resp:  [12-20] 20 (07/19 0552) BP: (87-137)/(38-73) 99/54 mmHg (07/19 0552) SpO2:  [94 %-100 %] 98 % (07/19 0552) Weight:  [64.728 kg (142 lb 11.2 oz)] 64.728 kg (142 lb 11.2 oz) (07/19 0415) Weight change: -5.172 kg (-11 lb 6.4 oz)  Intake/Output from previous day: 07/18 0701 - 07/19 0700 In: 6269 [P.O.:1040; I.V.:3; IV Piggyback:50] Out: -  Intake/Output this shift: Total I/O In: 240 [P.O.:240] Out: -   Generally patient is alert in no apparent distress. HEENT exam no conjunctival pallor and none icterus. He has elevated JVD. Chest: Decreased breath sound bilaterally. Heart exam revealed regular rate and rhythm Abdomen:  nontender and positive bowel sound. Extremities trace edema  Lab Results:  Recent Labs  05/12/14 0524 05/13/14 0606  WBC 6.0 5.7  HGB 10.1* 10.9*  HCT 29.1* 32.1*  PLT 183 201   BMET:   Recent Labs  05/12/14 0524 05/13/14 0606  NA 135* 134*  K 4.4 5.0  CL 95* 94*  CO2 25 24  GLUCOSE 87 83  BUN 21 35*  CREATININE 6.53* 7.89*  CALCIUM 8.5 9.0   No results found for this basename: PTH,  in the last 72 hours Iron Studies: No results found for this basename: IRON, TIBC, TRANSFERRIN, FERRITIN,  in the last 72 hours  Studies/Results: No results found.  I have reviewed the patient's current medications.  Assessment/Plan: Problem #1 chest pain: Patient is presently asymptomatic. Problem #2 severe cardiomyopathy with low ejection fraction. S/p Parasynthesis  . He does have any abdominal pain. Problem #3 hypotension: Patient blood pressure is stable. His on midodrine Problem #4 fever: Patient presently afebrile and his white blood cell count is normal. Blood culture no growth. Source of infection as this moment is not  clear. Since patient has some leukocyte in his peritneal  fluid possibility of spontaneous peritonitis is being entertained. Patient is on antibiotics. Problem #5 anemia: His hemoglobin is with in our target range Problem #6 metabolic bone disease: His calcium and phosphorus in the range. Plan: We will continue his present management and follow patient. We'll check his basic metabolic panel, phosphorus in the morning, and we'll make arrangements for patient to get dialysis.  LOS: 4 days   Chancey Cullinane S 05/13/2014,9:46 AM

## 2014-05-13 NOTE — Progress Notes (Signed)
[facility] PROGRESS NOTE  [name] EPP:[id number] DOB: 07-01-1946 DOA: 05/09/2014 PCP: Glo Herring., MD  Assessment/Plan: 1. Sepsis. Etiology is not entirely clear, but could be secondary to low-grade spontaneous bacterial peritonitis. Ascitic fluid cell count reviewed and it contains 622 WBCs with 46% as neutrophils. However, the Gram stain revealed no WBCs. Culture is pending. He does not have any significant infiltrate on chest imaging. The patient no longer makes any urine. He's not had any diarrhea. Does not have any skin lesions. Other source of infection could be his dialysis catheter. Blood cultures have been sent and are negative to date. Continue empiric antibiotics for now. If cultures remain negative, would transition antibiotic therapy to oral Cipro. 2. Chest pain. He has ruled out for ACS with negative cardiac markers. He has amyloidosis of the heart and has not had a recent echocardiogram. 2-D echocardiogram done with results as below. He's not had any further symptoms. He may need TEE to further evaluate RA mass.  Cardiology consult has been requested 3. End-stage renal disease on hemodialysis. Continue dialysis per nephrology. 4. Shortness of breath. Patient did have mild dyspnea on admission. CT scan of the chest was negative for pulmonary embolus. He was noted to have some ascites which could have contributed to his shortness of breath. Status post paracentesis. He is no longer short of breath. 5. Abdominal ascites, likely secondary to volume overload from end-stage renal disease and possibly from amyloidosis. Only minimal elevation in AST. He has no history of liver disease. Status post paracentesis yielding 4.6 L of fluid on 7/16. 6. Amyloidosis.  7. Hypotension, chronic. Patient is on midodrine. Apparently his systolic blood pressures are chronically in the 70s to 80s. He is asymptomatic with these blood pressures. This was confirmed by his  nephrologist, Dr.  Lowanda Foster. We'll continue to follow clinically. His TSH is within normal limits as is his cortisol level.  8. 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 Currently stable.  Code Status: full code Family Communication: Discussed with patient and wife. Disposition Plan: discharge home once improved, possibly in the next 24-48 hours.   Consultants:  Nephrology  Procedures:  Ultrasound-guided paracentesis per radiology on 05/10/2014; yielding a 4660 mL. Echo: - Left ventricle: The cavity size was normal. Wall thickness was increased in a pattern of moderate to severe LVH. Systolic function was normal. The estimated ejection fraction was in the range of 55% to 60%. Wall motion was normal; there were no regional wall motion abnormalities. Features are consistent with a pseudonormal left ventricular filling pattern, with concomitant abnormal relaxation and increased filling pressure (grade 2 diastolic dysfunction). - Aortic valve: Mildly calcified annulus. Trileaflet; mildly thickened leaflets. There was mild regurgitation. Valve area (VTI): 2.98 cm^2. Valve area (Vmax): 2.53 cm^2. - Mitral valve: Mildly calcified annulus. Mildly thickened leaflets . - Left atrium: The atrium was severely dilated. - Right ventricle: The cavity size was moderately dilated. RV TAPSE is 1.3 cm. Systolic function was mildly to moderately reduced. - Right atrium: The atrium was severely dilated. 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 The exact attachment point is unclear, but appears to be attached to either the superior RA wall or potentially originating from the IVC. Differential includes calcified thrombus or cardiac tumor. Consider TEE for further evaluation. - Atrial septum: No defect or patent foramen ovale was identified. - Tricuspid valve: There was moderate-severe regurgitation. TR vena contracta is 0.7 cm. - Pulmonary arteries: PA peak  pressure: 25 mm Hg (S). PASP is likely underestimated in the setting of significant TR and RV dysfunction    Antibiotics:  Vancomycin 7/15>>  Zosyn 7/15>>  HPI/Subjective: Patient is sitting up on side of bed and is eating dinner.  Denies any complaints. Feels good  Objective: Filed Vitals:   05/13/14 1806  BP: 86/59  Pulse:   Temp:   Resp:     Intake/Output Summary (Last 24 hours) at 05/13/14 1910 Last data filed at 05/13/14 1700  Gross per 24 hour  Intake    870 ml  Output      0 ml  Net    870 ml   Filed Weights   05/11/14 1450 05/12/14 0536 05/13/14 0415  Weight: 69.9 kg (154 lb 1.6 oz) 65.3 kg (143 lb 15.4 oz) 64.728 kg (142 lb 11.2 oz)    Exam:   General: Pleasant alert 68 year old African-American man in no acute distress.  Cardiovascular: S1, S2, with a soft systolic murmur.  Chest wall with dialysis catheter in place right upper chest wall with no surrounding erythema or tenderness.  Respiratory: Clear anteriorly with mild crackles at bases. Breathing is nonlabored.  Abdomen: soft, positive bowel sounds, nontender, nondistended. No masses palpated.   Musculoskeletal: No acute hot red joints. No pedal edema.  Neurologic/psychiatric: He is alert and oriented x3. Cranial nerves II through XII are grossly intact.   Data Reviewed: Basic Metabolic Panel:  Recent Labs Lab 05/09/14 1100 05/10/14 0450 05/11/14 0418 05/12/14 0524 05/13/14 0606  NA 137 136* 136* 135* 134*  K 3.8 4.4 4.6 4.4 5.0  CL 93* 94* 94* 95* 94*  CO2 25 27 23 25 24   GLUCOSE 83 85 87 87 83  BUN 18 30* 41* 21 35*  CREATININE 5.49* 7.47* 8.85* 6.53* 7.89*  CALCIUM 8.4 7.9* 8.0* 8.5 9.0  PHOS  --   --   --  3.7  --    Liver Function Tests:  Recent Labs Lab 05/09/14 1100 05/10/14 0450  AST 34 38*  ALT 19 22  ALKPHOS 221* 235*  BILITOT 0.9 0.6  PROT 9.2* 7.9  ALBUMIN 3.7 3.1*   No results found for this basename: LIPASE, AMYLASE,  in the last 168 hours No results  found for this basename: AMMONIA,  in the last 168 hours CBC:  Recent Labs Lab 05/09/14 1100 05/10/14 0450 05/11/14 0418 05/12/14 0524 05/13/14 0606  WBC 4.9 8.7 7.0 6.0 5.7  NEUTROABS 4.5  --   --   --   --   HGB 10.6* 9.7* 10.4* 10.1* 10.9*  HCT 31.4* 28.9* 30.4* 29.1* 32.1*  MCV 95.7 95.7 94.7 94.5 95.0  PLT 156 146* 168 183 201   Cardiac Enzymes:  Recent Labs Lab 05/09/14 1100 05/09/14 1820 05/09/14 2352  TROPONINI <0.30 <0.30 <0.30   BNP (last 3 results)  Recent Labs  02/12/14 1139 05/09/14 1100  PROBNP 21129.0* 32097.0*   CBG: No results found for this basename: GLUCAP,  in the last 168 hours  Recent Results (from the past 240 hour(s))  CULTURE, BLOOD (ROUTINE X 2)     Status: None   Collection Time    05/09/14 11:13 AM      Result Value Ref Range Status   Specimen Description BLOOD RIGHT HAND   Final   Special Requests BOTTLES DRAWN AEROBIC ONLY 5CC   Final   Culture NO GROWTH 4 DAYS   Final   Report Status PENDING   Incomplete  CULTURE, BLOOD (ROUTINE X  2)     Status: None   Collection Time    05/09/14 11:33 AM      Result Value Ref Range Status   Specimen Description BLOOD LEFT HAND   Final   Special Requests BOTTLES DRAWN AEROBIC AND ANAEROBIC 6CC   Final   Culture NO GROWTH 4 DAYS   Final   Report Status PENDING   Incomplete  MRSA PCR SCREENING     Status: Abnormal   Collection Time    05/09/14  3:09 PM      Result Value Ref Range Status   MRSA by PCR POSITIVE (*) NEGATIVE Final   Comment:            The GeneXpert MRSA Assay (FDA     approved for NASAL specimens     only), is one component of a     comprehensive MRSA colonization     surveillance program. It is not     intended to diagnose MRSA     infection nor to guide or     monitor treatment for     MRSA infections.     RESULT CALLED TO, READ BACK BY AND VERIFIED WITH:     SCHONEWITZ,L. AT 1845 ON 05/09/2014 BY BAUGHAM,M.  BODY FLUID CULTURE     Status: None   Collection Time     05/10/14 11:00 AM      Result Value Ref Range Status   Specimen Description PARACENTESIS FLUID   Final   Special Requests NONE   Final   Gram Stain     Final   Value: NO WBC SEEN     NO ORGANISMS SEEN     Performed at [facility]   Culture     Final   Value: NO GROWTH 3 DAYS     Performed at [facility]   Report Status 05/13/2014 FINAL   Final     Studies: No results found.  Scheduled Meds: . allopurinol  100 mg Oral Daily  . aspirin EC  81 mg Oral Daily  . Chlorhexidine Gluconate Cloth  6 each Topical Q0600  . docusate sodium  100 mg Oral BID  . enoxaparin (LOVENOX) injection  30 mg Subcutaneous Q24H  . metoCLOPramide  10 mg Oral TID AC  . midodrine  20 mg Oral TID AC  . multivitamin  1 tablet Oral Daily  . mupirocin ointment  1 application Nasal BID  . pantoprazole  40 mg Oral Daily  . piperacillin-tazobactam (ZOSYN)  IV  2.25 g Intravenous 3 times per day  . sevelamer carbonate  800 mg Oral TID WC  . sodium chloride  3 mL Intravenous Q12H  . vancomycin  750 mg Intravenous Q M,W,F-HD  . venlafaxine  75 mg Oral BID WC   Continuous Infusions:   Principal Problem:   Sepsis Active Problems:   ESRD (end stage renal disease)   Hypotension   Chest pain   Tachycardia   Dyspnea   Amyloid heart disease   Ascites   Anemia in chronic kidney disease(285.21)   SBP (spontaneous bacterial peritonitis)    Time spent: 48mins    Anavictoria Wilk  [facility] Pager [phone]. If 7PM-7AM, please contact night-coverage at [email or web address], password [facility] 05/13/2014, 7:10 PM  LOS: 4 days

## 2014-05-14 DIAGNOSIS — R000 Tachycardia, unspecified: Secondary | ICD-10-CM

## 2014-05-14 LAB — CULTURE, BLOOD (ROUTINE X 2)
CULTURE: NO GROWTH
CULTURE: NO GROWTH

## 2014-05-14 LAB — GLUCOSE, CAPILLARY
Glucose-Capillary: 75 mg/dL (ref 70–99)
Glucose-Capillary: 161 mg/dL — ABNORMAL HIGH (ref 70–99)

## 2014-05-14 LAB — BASIC METABOLIC PANEL
ANION GAP: 15 (ref 5–15)
BUN: 43 mg/dL — ABNORMAL HIGH (ref 6–23)
CHLORIDE: 91 meq/L — AB (ref 96–112)
CO2: 26 mEq/L (ref 19–32)
Calcium: 8.9 mg/dL (ref 8.4–10.5)
Creatinine, Ser: 9.61 mg/dL — ABNORMAL HIGH (ref 0.50–1.35)
GFR calc non Af Amer: 5 mL/min — ABNORMAL LOW (ref >90)
GFR, EST AFRICAN AMERICAN: 6 mL/min — AB (ref >90)
Glucose, Bld: 85 mg/dL (ref 70–99)
Potassium: 6 mEq/L — ABNORMAL HIGH (ref 3.7–5.3)
Sodium: 132 mEq/L — ABNORMAL LOW (ref 137–147)

## 2014-05-14 LAB — PHOSPHORUS: PHOSPHORUS: 5.1 mg/dL — AB (ref 2.3–4.6)

## 2014-05-14 MED ORDER — CIPROFLOXACIN HCL 250 MG PO TABS
500.0000 mg | ORAL_TABLET | Freq: Every day | ORAL | Status: DC
  Administered 2014-05-14: 500 mg via ORAL
  Filled 2014-05-14: qty 2

## 2014-05-14 MED ORDER — SODIUM CHLORIDE 0.9 % IV SOLN: 100.0000 mL | INTRAVENOUS | Status: DC | PRN

## 2014-05-14 MED ORDER — VANCOMYCIN HCL IN DEXTROSE 750-5 MG/150ML-% IV SOLN
750.0000 mg | INTRAVENOUS | Status: DC
  Filled 2014-05-14: qty 150

## 2014-05-14 MED ORDER — PHENYLEPHRINE HCL 10 MG/ML IJ SOLN
30.0000 ug/min | INTRAVENOUS | Status: DC
  Administered 2014-05-14: 30 ug/min via INTRAVENOUS
  Filled 2014-05-14: qty 1

## 2014-05-14 MED ORDER — PHENYLEPHRINE HCL 10 MG/ML IJ SOLN
INTRAMUSCULAR | Status: AC
  Filled 2014-05-14: qty 1

## 2014-05-14 MED ORDER — PIPERACILLIN-TAZOBACTAM IN DEX 2-0.25 GM/50ML IV SOLN
INTRAVENOUS | Status: AC
  Filled 2014-05-14: qty 50

## 2014-05-14 MED ORDER — ALTEPLASE 2 MG IJ SOLR
2.0000 mg | Freq: Once | INTRAMUSCULAR | Status: AC | PRN
  Filled 2014-05-14: qty 2

## 2014-05-14 MED ORDER — PIPERACILLIN-TAZOBACTAM IN DEX 2-0.25 GM/50ML IV SOLN
2.2500 g | Freq: Three times a day (TID) | INTRAVENOUS | Status: DC
  Administered 2014-05-14 – 2014-05-15 (×2): 2.25 g via INTRAVENOUS
  Filled 2014-05-14 (×6): qty 50

## 2014-05-14 MED ORDER — METOPROLOL TARTRATE 1 MG/ML IV SOLN: 2.5000 mg | Freq: Once | INTRAVENOUS | Status: DC

## 2014-05-14 NOTE — Progress Notes (Signed)
Was called to see patient for tachycardia. On my arrival, patient is shivering. He is bundled up in several blankets. He reports feeling cold. Heart rate is noted to be tachycardic and irregular on auscultation. Blood pressure appears to be stable with systolic in the low 188C. Patient reports that this usually occurs during his dialysis session. Question possibly related to cooled IV fluids. This was discussed with his wife reports that this been occurring more frequently in the last 2 dialysis sessions which is why she originally brought him to the hospital. He does not appear to be febrile. He will likely need monitoring in the stepdown unit for the next 24 hours. Will obtain EKG, blood glucose. Question related to midodrine. He has completed 3 hours of dialysis. Would recommend discontinuing dialysis early at this point. Continue to monitor closely.  Katelee Schupp

## 2014-05-14 NOTE — Discharge Summary (Signed)
Physician Discharge Summary  [name] YKZ:[id number] DOB: [birth year] DOA: 05/09/2014  PCP: Glo Herring., MD  Admit date: 05/09/2014 Discharge date: 05/14/2014  Time spent: 40 minutes  Recommendations for Outpatient Follow-up:  1. Follow up with PCP for evaluation of symptoms 2. Continue dialysis per regular schedule  Discharge Diagnoses:  Principal Problem:   Sepsis Active Problems:   ESRD (end stage renal disease)   Hypotension   Chest pain   Tachycardia   Dyspnea   Amyloid heart disease   Ascites   Anemia in chronic kidney disease(285.21)   SBP (spontaneous bacterial peritonitis)   Discharge Condition: stable  Diet recommendation: heart healthy  Filed Weights   05/13/14 0415 05/14/14 0631 05/14/14 1215  Weight: 64.728 kg (142 lb 11.2 oz) 65 kg (143 lb 4.8 oz) 65 kg (143 lb 4.8 oz)    History of present illness:  [name] is a 68 y.o. male with past medical history of end-stage renal disease on dialysis, chronic hypotension, amyloid heart disease, cardiomyopathy presented to the emergency department from the dialysis center on 05/09/14 the chief complaint of chest pain. Information obtained from the patient and his wife  at the bedside. He reported developing anterior chest pain during dialysis. Stated the pain as sharp located left anterior not radiating. Denied sob, nausea, diaphoresis. Reported being in usual state of health except for brief abdominal pain 2 days prior. Denied nausea/vomiting/diarrhea.  In ED patient febrile with 101.1, BP 69/50, HR 86. He was not hypoxic. CBC significant for hemoglobin of 10.6 relative neutrophils 93%, basic metabolic panel significant for creatinine of 5.49, alkaline phosphatase 221, BNP 32,097, lactic acid 4.6. Chest x-ray with no acute cardiopulmonary process. Emergency department he was given one dose of vancomycin one dose of Zosyn and a 500 mils bolus of normal saline.   Hospital Course:  Assessment/Plan:  1. Sepsis.  Etiology is not entirely clear, but could be secondary to low-grade spontaneous bacterial peritonitis. Ascitic fluid cell count reviewed and it contains 622 WBCs with 46% as neutrophils. However, the Gram stain revealed no WBCs. Culture is pending. He does not have any significant infiltrate on chest imaging. The patient no longer makes any urine. He's not had any diarrhea. Does not have any skin lesions. Other source of infection could be his dialysis catheter. Blood cultures have been sent and are negative to date. Provided with vanc and zosyn for 5 days. Cultures remained negative and antibiotics transitioned to cipro at discharge to complete 7 days. Follow up with PCP 1 week for evaluation of symptoms.   Chest pain. He has ruled out for ACS with negative cardiac markers. He has amyloidosis of the heart. 2-D echocardiogram done with results as below. He had no further symptoms. Discussed with cardiology who opine no TEE needed.   2. End-stage renal disease on hemodialysis. Continue dialysis per nephrology.  3. Shortness of breath. Patient did have mild dyspnea on admission. CT scan of the chest was negative for pulmonary embolus. He was noted to have some ascites which could have contributed to his shortness of breath. Status post paracentesis. He is no longer short of breath. 4. Abdominal ascites, likely secondary to volume overload from end-stage renal disease and possibly from amyloidosis. Only minimal elevation in AST. He has no history of liver disease. Status post paracentesis yielding 4.6 L of fluid on 7/16. 5. Amyloidosis.  6. Hypotension, chronic. Patient is on midodrine. Apparently his systolic blood pressures are chronically in the 70s to 80s. He is  asymptomatic with these blood pressures. This was confirmed by his nephrologist, Dr. Lowanda Foster. Marland Kitchen His TSH is within normal limits as is his cortisol level.  7. 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  Currently stable. 8. Tachycardia: during dialysis on 05/14/14. EKG   Procedures:   Ultrasound-guided paracentesis per radiology on 05/10/2014; yielding a 4660 mL. Echo: - Left ventricle: The cavity size was normal. Wall thickness was increased in a pattern of moderate to severe LVH. Systolic function was normal. The estimated ejection fraction was in the range of 55% to 60%. Wall motion was normal; there were no regional wall motion abnormalities. Features are consistent with a pseudonormal left ventricular filling pattern, with concomitant abnormal relaxation and increased filling pressure (grade 2 diastolic dysfunction). - Aortic valve: Mildly calcified annulus. Trileaflet; mildly thickened leaflets. There was mild regurgitation. Valve area (VTI): 2.98 cm^2. Valve area (Vmax): 2.53 cm^2. - Mitral valve: Mildly calcified annulus. Mildly thickened leaflets . - Left atrium: The atrium was severely dilated. - Right ventricle: The cavity size was moderately dilated. RV TAPSE is 1.3 cm. Systolic function was mildly to moderately reduced. - Right atrium: The atrium was severely dilated. 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 The exact attachment point is unclear, but appears to be attached to either the superior RA wall or potentially originating from the IVC. Differential includes calcified thrombus or cardiac tumor. Consider TEE for further evaluation. - Atrial septum: No defect or patent foramen ovale was identified. - Tricuspid valve: There was moderate-severe regurgitation. TR vena contracta is 0.7 cm. - Pulmonary arteries: PA peak pressure: 25 mm Hg (S). PASP is likely underestimated in the setting of significant TR and RV dysfunction    Consultations:  nephrology  Discharge Exam: Filed Vitals:   05/14/14 1558  BP: 108/53  Pulse: 160  Temp:   Resp:     General: thin frail NAD Cardiovascular: RRR No MGR no LE edema Respiratory: normal effort  BS clear bilaterally Abdomen soft +BS non-tender to palpation  Discharge Instructions You were cared for by a hospitalist during your hospital stay. If you have any questions about your discharge medications or the care you received while you were in the hospital after you are discharged, you can call the unit and asked to speak with the hospitalist on call if the hospitalist that took care of you is not available. Once you are discharged, your primary care physician will handle any further medical issues. Please note that NO REFILLS for any discharge medications will be authorized once you are discharged, as it is imperative that you return to your primary care physician (or establish a relationship with a primary care physician if you do not have one) for your aftercare needs so that they can reassess your need for medications and monitor your lab values.     Medication List    ASK your doctor about these medications       allopurinol 100 MG tablet  Commonly known as:  ZYLOPRIM  Take 100 mg by mouth daily.     aspirin 81 MG tablet  Take 81 mg by mouth daily.     cinacalcet 30 MG tablet  Commonly known as:  SENSIPAR  Take 30 mg by mouth daily.     metoCLOPramide 10 MG tablet  Commonly known as:  REGLAN  Take 10 mg by mouth Three times a day.     midodrine 10 MG tablet  Commonly known as:  PROAMATINE  Take 20 mg by mouth 3 (three) times daily.     multivitamin Tabs tablet  Take 1 tablet by mouth daily.     omeprazole 20 MG capsule  Commonly known as:  PRILOSEC  Take 20 mg by mouth 2 (two) times daily before a meal.     sevelamer carbonate 800 MG tablet  Commonly known as:  RENVELA  Take 800 mg by mouth 3 (three) times daily with meals.     Venlafaxine HCl 75 MG Tb24  Take 75 mg by mouth 2 (two) times daily.       No Known Allergies Follow-up Information   Follow up with Glo Herring., MD. Schedule an appointment as soon as possible for a visit in 1 week.  (evaluation of symptoms)    Specialty:  Internal Medicine   Contact information:   [place] [address] [phone]        The results of significant diagnostics from this hospitalization (including imaging, microbiology, ancillary and laboratory) are listed below for reference.    Significant Diagnostic Studies: Ct Angio Chest Pe W/cm &/or Wo Cm  05/09/2014   CLINICAL DATA:  Hypoxia.  Chest pain.  EXAM: CT ANGIOGRAPHY CHEST WITH CONTRAST  TECHNIQUE: Multidetector CT imaging of the chest was performed using the standard protocol during bolus administration of intravenous contrast. Multiplanar CT image reconstructions and MIPs were obtained to evaluate the vascular anatomy.  CONTRAST:  180mL OMNIPAQUE IOHEXOL 350 MG/ML SOLN  COMPARISON:  Chest x-ray 05/09/2014.  FINDINGS: Thoracic aorta is unremarkable. No evidence of aneurysm or dissection. Cardiomegaly. Coronary artery disease. Tiny pericardial effusion. Pulmonary arteries are normal.  No significant mediastinal or hilar adenopathy. Thoracic esophagus unremarkable.  Large airways patent. Bibasilar atelectasis. No focal pulmonary infiltrates. No evidence of mass. No pleural effusion or pneumothorax.  Prominent ascites. Omental soft tissue thickening is noted. This may be related to ascites however omental tumor cannot be excluded and CT of the abdomen pelvis should be considered for further evaluation. Cholecystectomy.  Thyroid unremarkable. No supraclavicular or axillary adenopathy. Bilateral IJ catheters are present. Degenerative changes thoracic spine.  Review of the MIP images confirms the above findings.  IMPRESSION: 1. Cardiomegaly. Coronary disease. Tiny pericardial effusion. No pulmonary embolus. 2. Prominent ascites. Omental soft tissue thickened is noted. CT of the abdomen and pelvis should be considered for further evaluation of these findings.   Electronically Signed   By: Marcello Moores  Register   On: 05/09/2014 15:17    US Abdomen Limited  05/09/2014   CLINICAL DATA:  End-stage renal disease.  EXAM: LIMITED ABDOMEN ULTRASOUND FOR ASCITES  TECHNIQUE: Limited ultrasound survey for ascites was performed in all four abdominal quadrants.  COMPARISON:  Ultrasound 06/01/2013.  FINDINGS: Moderate ascites noted throughout the abdomen.  IMPRESSION: Moderate diffuse ascites.   Electronically Signed   By: Marcello Moores  Register   On: 05/09/2014 15:29   US Paracentesis  05/10/2014   CLINICAL DATA:  End-stage renal disease, ascites, shortness of breath  EXAM: ULTRASOUND GUIDED DIAGNOSTIC AND THERAPEUTIC PARACENTESIS  COMPARISON:  Limited abdomen ultrasound 05/09/2014  PROCEDURE: Procedure, benefits, and risks of procedure were discussed with patient.  Written informed consent for procedure was obtained.  Time out protocol followed.  Adequate collection of ascites localized in RIGHT lower quadrant.  Skin prepped and draped in usual sterile fashion.  Skin and soft tissues anesthetized with 9 mL of 1% lidocaine.  5 [place] Yueh catheter placed into peritoneal cavity.  4660 ml of peach colored fluid aspirated  by vacuum bottle suction.  Procedure tolerated well by patient without immediate complication.  FINDINGS: A total of approximately 4660 mL of ascitic fluid was removed. A fluid sample of 180 mL was sent for laboratory analysis.  IMPRESSION: Successful ultrasound guided paracentesis yielding 4660 mL of ascites.   Electronically Signed   By: Lavonia Dana M.D.   On: 05/10/2014 11:50   Dg Chest Portable 1 View  05/09/2014   CLINICAL DATA:  Chest pain, end-stage renal disease, amyloid cardiomyopathy  EXAM: PORTABLE CHEST - 1 VIEW  COMPARISON:  Portable chest x-ray of February 12, 2014  FINDINGS: The lungs are adequately inflated. There is no focal infiltrate. The interstitial markings are mildly prominent though stable. The cardiac silhouette is top-normal in size but stable. There is no pleural effusion or significant pulmonary vascular and  congestion. Two large caliber dialysis type catheters are in place via bilateral internal jugular approach. The tips lie in the mid and distal portions of the SVC. The bony thorax is unremarkable.  IMPRESSION: There is no acute cardiopulmonary abnormality.   Electronically Signed   By: David  Martinique   On: 05/09/2014 11:12    Microbiology: Recent Results (from the past 240 hour(s))  CULTURE, BLOOD (ROUTINE X 2)     Status: None   Collection Time    05/09/14 11:13 AM      Result Value Ref Range Status   Specimen Description BLOOD RIGHT HAND   Final   Special Requests BOTTLES DRAWN AEROBIC ONLY 5CC   Final   Culture NO GROWTH 5 DAYS   Final   Report Status 05/14/2014 FINAL   Final  CULTURE, BLOOD (ROUTINE X 2)     Status: None   Collection Time    05/09/14 11:33 AM      Result Value Ref Range Status   Specimen Description BLOOD LEFT HAND   Final   Special Requests BOTTLES DRAWN AEROBIC AND ANAEROBIC 6CC   Final   Culture NO GROWTH 5 DAYS   Final   Report Status 05/14/2014 FINAL   Final  MRSA PCR SCREENING     Status: Abnormal   Collection Time    05/09/14  3:09 PM      Result Value Ref Range Status   MRSA by PCR POSITIVE (*) NEGATIVE Final   Comment:            The GeneXpert MRSA Assay (FDA     approved for NASAL specimens     only), is one component of a     comprehensive MRSA colonization     surveillance program. It is not     intended to diagnose MRSA     infection nor to guide or     monitor treatment for     MRSA infections.     RESULT CALLED TO, READ BACK BY AND VERIFIED WITH:     SCHONEWITZ,L. AT 1845 ON 05/09/2014 BY BAUGHAM,M.  BODY FLUID CULTURE     Status: None   Collection Time    05/10/14 11:00 AM      Result Value Ref Range Status   Specimen Description PARACENTESIS FLUID   Final   Special Requests NONE   Final   Gram Stain     Final   Value: NO WBC SEEN     NO ORGANISMS SEEN     Performed at [facility]   Culture     Final   Value: NO GROWTH 3  DAYS  Performed at [facility]   Report Status 05/13/2014 FINAL   Final     Labs: Basic Metabolic Panel:  Recent Labs Lab 05/10/14 0450 05/11/14 0418 05/12/14 0524 05/13/14 0606 05/14/14 0618 05/14/14 0622  NA 136* 136* 135* 134*  --  132*  K 4.4 4.6 4.4 5.0  --  6.0*  CL 94* 94* 95* 94*  --  91*  CO2 27 23 25 24   --  26  GLUCOSE 85 87 87 83  --  85  BUN 30* 41* 21 35*  --  43*  CREATININE 7.47* 8.85* 6.53* 7.89*  --  9.61*  CALCIUM 7.9* 8.0* 8.5 9.0  --  8.9  PHOS  --   --  3.7  --  5.1*  --    Liver Function Tests:  Recent Labs Lab 05/09/14 1100 05/10/14 0450  AST 34 38*  ALT 19 22  ALKPHOS 221* 235*  BILITOT 0.9 0.6  PROT 9.2* 7.9  ALBUMIN 3.7 3.1*   No results found for this basename: LIPASE, AMYLASE,  in the last 168 hours No results found for this basename: AMMONIA,  in the last 168 hours CBC:  Recent Labs Lab 05/09/14 1100 05/10/14 0450 05/11/14 0418 05/12/14 0524 05/13/14 0606  WBC 4.9 8.7 7.0 6.0 5.7  NEUTROABS 4.5  --   --   --   --   HGB 10.6* 9.7* 10.4* 10.1* 10.9*  HCT 31.4* 28.9* 30.4* 29.1* 32.1*  MCV 95.7 95.7 94.7 94.5 95.0  PLT 156 146* 168 183 201   Cardiac Enzymes:  Recent Labs Lab 05/09/14 1100 05/09/14 1820 05/09/14 2352  TROPONINI <0.30 <0.30 <0.30   BNP: BNP (last 3 results)  Recent Labs  02/12/14 1139 05/09/14 1100  PROBNP 21129.0* 32097.0*   CBG: No results found for this basename: GLUCAP,  in the last 168 hours     Signed:  Radene Gunning  [facility] 05/14/2014, 4:11 PM

## 2014-05-14 NOTE — Progress Notes (Signed)
Received patient from hemodialysis at 1700. Patient rigorring, warm to touch. Patient denies pain. HR 150, regular. BP 71/41, RR 27, O2 sat 100% on room air, T103 oral. Finding reported to Dr Roderic Palau. Patient and wife updated on plan of care, and that patient needs to remain inpatient at this time. They expressed understanding.

## 2014-05-14 NOTE — Procedures (Signed)
   HEMODIALYSIS TREATMENT NOTE:  Dialysis initiated without problems.  Catheter exit site unremarkable.  SBP 90s.  Two hours into treatment (@1430 ) pt c/o feeling cold.  Warm blankets given.  Afebrile.  Dialysate had been cooled to 35 degrees for intradialytic BP support. Window was opened to help warm patient.  Phone call received at 1520 from central tele-monitoring department; pt suddenly tachycardic with PVCs and one 10-beat run of VT.  BP 85/65, HR 140s (apical confirmation).  Pt trembling/feeling cold. Ultrafiltration was stopped.  Dyanne Carrel, NP was notified.  EKG was ordered; questioned artifact v ectopy.  Pt remained baseline hypotensive and with no complaints other than being cold with noted trembling.  Dr. Lowanda Foster was paged.  Dr. Roderic Palau and Ms. Black assessed pt.  Glucose 75 by fingerstick. Dialysis was stopped after 3 hours d/t persistent tachycardia.  Decision was made to transfer patient to ICU.  Situation was discussed with patient's wife, at pt's request.  Report given to Jadene Pierini, RN who transferred pt to ICU from HD unit.  Pt febrile at this point.  Total HD time: 3 hours Net UF:  1.5 liters  Evelia Waskey L. Muneeb Veras, RN, CDN

## 2014-05-14 NOTE — Progress Notes (Signed)
ANTIBIOTIC CONSULT NOTE - follow up  Pharmacy Consult for Vancomycin and Zosyn Indication: rule out sepsis  No Known Allergies  Patient Measurements: Height: 5\' 6"  (167.6 cm) Weight: 143 lb 4.8 oz (65 kg) IBW/kg (Calculated) : 63.8 (note discrepancy in weight since admission)  Vital Signs: Temp: 98.3 F (36.8 C) (07/20 0500) Temp src: Oral (07/20 0500) BP: 89/61 mmHg (07/20 0631) Pulse Rate: 61 (07/20 0631) Intake/Output from previous day: 07/19 0701 - 07/20 0700 In: 870 [P.O.:720; IV Piggyback:150] Out: -  Intake/Output from this shift:    Labs:  Recent Labs  05/12/14 0524 05/13/14 0606 05/14/14 0622  WBC 6.0 5.7  --   HGB 10.1* 10.9*  --   PLT 183 201  --   CREATININE 6.53* 7.89* 9.61*   Estimated Creatinine Clearance: 6.7 ml/min (by C-G formula based on Cr of 9.61). No results found for this basename: VANCOTROUGH, Corlis Leak, VANCORANDOM, GENTTROUGH, GENTPEAK, GENTRANDOM, TOBRATROUGH, TOBRAPEAK, TOBRARND, AMIKACINPEAK, AMIKACINTROU, AMIKACIN,  in the last 72 hours   Microbiology: Recent Results (from the past 720 hour(s))  CULTURE, BLOOD (ROUTINE X 2)     Status: None   Collection Time    05/09/14 11:13 AM      Result Value Ref Range Status   Specimen Description BLOOD RIGHT HAND   Final   Special Requests BOTTLES DRAWN AEROBIC ONLY 5CC   Final   Culture NO GROWTH 4 DAYS   Final   Report Status PENDING   Incomplete  CULTURE, BLOOD (ROUTINE X 2)     Status: None   Collection Time    05/09/14 11:33 AM      Result Value Ref Range Status   Specimen Description BLOOD LEFT HAND   Final   Special Requests BOTTLES DRAWN AEROBIC AND ANAEROBIC [place]   Final   Culture NO GROWTH 4 DAYS   Final   Report Status PENDING   Incomplete  MRSA PCR SCREENING     Status: Abnormal   Collection Time    05/09/14  3:09 PM      Result Value Ref Range Status   MRSA by PCR POSITIVE (*) NEGATIVE Final   Comment:            The GeneXpert MRSA Assay (FDA     approved for NASAL  specimens     only), is one component of a     comprehensive MRSA colonization     surveillance program. It is not     intended to diagnose MRSA     infection nor to guide or     monitor treatment for     MRSA infections.     RESULT CALLED TO, READ BACK BY AND VERIFIED WITH:     SCHONEWITZ,L. AT 1845 ON 05/09/2014 BY BAUGHAM,M.  BODY FLUID CULTURE     Status: None   Collection Time    05/10/14 11:00 AM      Result Value Ref Range Status   Specimen Description PARACENTESIS FLUID   Final   Special Requests NONE   Final   Gram Stain     Final   Value: NO WBC SEEN     NO ORGANISMS SEEN     Performed at [facility]   Culture     Final   Value: NO GROWTH 3 DAYS     Performed at [facility]   Report Status 05/13/2014 FINAL   Final   Medical History: Past Medical History  Diagnosis Date  . Hypotension   .  Amyloid heart disease   . Cardiomyopathy   . Gout   . BPH (benign prostatic hypertrophy)   . ESRD (end stage renal disease) on dialysis    Vancomycin 7/15 >> Zosyn 7/15 >>  Assessment: 68yo male with ESRD requiring dialysis.  Currently afebrile.  Dialysis schedule is MWF.  Cultures non-contributory.    Goal of Therapy:  Pre-Hemodialysis Vancomycin level goal range =15-25 mcg/ml  Plan:  Vancomycin 750mg  IV after each dialysis (MWF) Check pre-dialysis vancomycin level when indicated. Zosyn 2.25gm IV q8h  Monitor labs, renal fxn, and cultures Deescalate antibiotics when appropriate.    [place] Crane, Lasandra Batley A 05/14/2014,9:00 AM

## 2014-05-14 NOTE — Progress Notes (Signed)
Subjective: Interval Histosry: Patient denies any difficulty breathing. His appetite is good and overall no new complaints.  Objective: Vital signs in last 24 hours: Temp:  [98.3 F (36.8 C)-98.5 F (36.9 C)] 98.3 F (36.8 C) (07/20 0500) Pulse Rate:  [59-67] 61 (07/20 0631) Resp:  [18-20] 18 (07/20 0500) BP: (76-102)/(48-61) 89/61 mmHg (07/20 0631) SpO2:  [95 %-100 %] 95 % (07/20 0500) Weight:  [65 kg (143 lb 4.8 oz)] 65 kg (143 lb 4.8 oz) (07/20 0631) Weight change: 0.272 kg (9.6 oz)  Intake/Output from previous day: 07/19 0701 - 07/20 0700 In: 870 [P.O.:720; IV Piggyback:150] Out: -  Intake/Output this shift:    Generally patient is alert in no apparent distress. HEENT exam no conjunctival pallor and none icterus. He has elevated JVD. Chest: Decreased breath sound bilaterally. Heart exam revealed regular rate and rhythm Abdomen:  nontender and positive bowel sound. Extremities trace edema  Lab Results:  Recent Labs  05/12/14 0524 05/13/14 0606  WBC 6.0 5.7  HGB 10.1* 10.9*  HCT 29.1* 32.1*  PLT 183 201   BMET:   Recent Labs  05/13/14 0606 05/14/14 0622  NA 134* 132*  K 5.0 6.0*  CL 94* 91*  CO2 24 26  GLUCOSE 83 85  BUN 35* 43*  CREATININE 7.89* 9.61*  CALCIUM 9.0 8.9   No results found for this basename: PTH,  in the last 72 hours Iron Studies: No results found for this basename: IRON, TIBC, TRANSFERRIN, FERRITIN,  in the last 72 hours  Studies/Results: No results found.  I have reviewed the patient's current medications.  Assessment/Plan: Problem #1 chest pain: Patient denies any chest pain. The last time he he does prior to this admission. Problem #2 severe cardiomyopathy with low ejection fraction. Presently denies any difficulty breathing. Patient also does not have any orthopnea or paroxysmal nocturnal dyspnea. Problem #3 hypotension: . His on midodrine. His blood pressure stays low normal. Occasionally still goes into 80's Problem #4  fever: Patient presently afebrile and his white blood cell count is normal. Blood culture no growth. Source of infection as this moment is not clear. Since patient has some leukocyte in his peritneal  fluid possibility of spontaneous peritonitis is being entertained. Patient is on antibiotics. Problem #5 anemia: His hemoglobin is stable. And his hemoglobin hematocrit is was in our target goal. Problem #6 metabolic bone disease: His calcium and phosphorus in the range. Plan: We will make arrangement for dialysis today. If patient is going to be discharged we'll follow him as an outpatient.  LOS: 5 days   Aubri Gathright S 05/14/2014,7:40 AM

## 2014-05-14 NOTE — Progress Notes (Signed)
Spoke with Dr. [place] Pink concerning patient's blood pressure. Blood pressure is now 79/54. Patient is not symptomatic. No further orders received. Will continue to monitor closely.

## 2014-05-14 NOTE — Progress Notes (Signed)
[place] Progress Note Patient Name: [name] DOB: 1946/03/26 MRN: [id number]  Date of Service  05/14/2014   HPI/Events of Note   Pt remains hypotensive  eICU Interventions  Plan to start neosynephrine   Intervention Category Major Interventions: Hypotension - evaluation and management  Asencion Noble 05/14/2014, 8:22 PM

## 2014-05-14 NOTE — Progress Notes (Signed)
Patient seen and examined. Note reviewed  Is a complex 68 year old gentleman with history of amyloidosis and end-stage renal disease on hemodialysis. The patient was initially admitted to the hospital with low-grade fever, chest pain and questionable sepsis. He has chronically low blood pressure with systolic blood pressures ranging in 70s to 80s. Patient is asymptomatic with these blood pressures. He was started on empiric antibiotics and blood culture showed no significant growth. He did have a paracentesis done for significant ascites and there is question of developing spontaneous bacterial peritonitis. In any case, the patient was continued on intravenous antibiotics and did not have any further fevers during his hospital stay. He did not have a significant leukocytosis. 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 This was further reviewed with Dr. Harrington Challenger and it was felt that this was likely an old catheter tip which may have fibrosed into the right atrium. It was not felt that TEE was needed at this time no specific intervention was recommended. Patient was undergoing dialysis and preparations for discharge home when he starts to develop significant rigors. Heart rate increased into the 150s to 170s, SVT per EKG. Blood pressure was noted to be hypotensive with systolic blood pressures in the 70s to 80s. The patient was transferred back to the step down unit. Temperature was noted to be elevated at 103. Blood cultures are sent and patient will be restarted on empiric antibiotics. It is possible that his dialysis catheter could be infected. We'll likely need to discuss with nephrology further tomorrow. Regarding his SVT, if blood pressure allows, can consider using very low-dose IV Lopressor. I suspect his heart rate should get better as his fever resolves. Cardiology consultation has been requested. His wife was updated regarding the change in his status.  [name]

## 2014-05-14 NOTE — Progress Notes (Signed)
Dr Roderic Palau notified of EKG results.

## 2014-05-14 NOTE — Progress Notes (Signed)
[facility] PROGRESS NOTE  THERMAN HUGHLETT FAO:[id number] DOB: 1946/09/05 DOA: 05/09/2014 PCP: Glo Herring., MD  Assessment/Plan: Assessment/Plan:  1. Sepsis. Etiology is not entirely clear, but could be secondary to low-grade spontaneous bacterial peritonitis. Ascitic fluid cell count reviewed and it contains 622 WBCs with 46% as neutrophils. However, the Gram stain revealed no WBCs. Culture is pending. He does not have any significant infiltrate on chest imaging. The patient no longer makes any urine. He's not had any diarrhea. Does not have any skin lesions. Other source of infection could be his dialysis catheter. Blood cultures have been sent and are negative to date. Transition vanc and zosyn to cipro po. He is afebrile and non-toxic appearing 2. Chest pain. He has ruled out for ACS with negative cardiac markers. He has amyloidosis of the heart. 2-D echocardiogram done with results as below. He's not had any further symptoms. Results discussed with cardiology who opines no TEE needed.  3. End-stage renal disease on hemodialysis.dialysis today. Completed 3 hours. Interrupted due to tachycardia and extreme chill.  4. Shortness of breath. Patient did have mild dyspnea on admission. CT scan of the chest was negative for pulmonary embolus. He was noted to have some ascites which could have contributed to his shortness of breath. Status post paracentesis. He is no longer short of breath. 5. Abdominal ascites, likely secondary to volume overload from end-stage renal disease and possibly from amyloidosis. Only minimal elevation in AST. He has no history of liver disease. Status post paracentesis yielding 4.6 L of fluid on 7/16. 6. Amyloidosis.  7. Hypotension, chronic. Patient is on midodrine. Asymptomatic with these blood pressures. His TSH is within normal limits as is his cortisol level.  8. Anemia of chronic disease secondary  9. Tachycardia: during dialysis. May be related to dialysis.  Dialysis interrupted after 3 hours. Denies chest pain. BP above his baseline.  EKG reveals SVT. Transfer to step down. Will provide low dose lopressor and monitor closely as he has chronic hypotension. Request cardiology consult    Code Status: full Family Communication: none present Disposition Plan: home hopefully tomorrow   Consultants:  neprhology  Procedures: Ultrasound-guided paracentesis per radiology on 05/10/2014; yielding a 4660 mL. Echo: - Left ventricle: The cavity size was normal. Wall thickness was increased in a pattern of moderate to severe LVH. Systolic function was normal. The estimated ejection fraction was in the range of 55% to 60%. Wall motion was normal; there were no regional wall motion abnormalities. Features are consistent with a pseudonormal left ventricular filling pattern, with concomitant abnormal relaxation and increased filling pressure (grade 2 diastolic dysfunction). - Aortic valve: Mildly calcified annulus. Trileaflet; mildly thickened leaflets. There was mild regurgitation. Valve area (VTI): 2.98 cm^2. Valve area (Vmax): 2.53 cm^2. - Mitral valve: Mildly calcified annulus. Mildly thickened leaflets . - Left atrium: The atrium was severely dilated. - Right ventricle: The cavity size was moderately dilated. RV TAPSE is 1.3 cm. Systolic function was mildly to moderately reduced. - Right atrium: The atrium was severely dilated. 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 The exact attachment point is unclear, but appears to be attached to either the superior RA wall or potentially originating from the IVC. Differential includes calcified thrombus or cardiac tumor. Consider TEE for further evaluation. - Atrial septum: No defect or patent foramen ovale was identified. - Tricuspid valve: There was moderate-severe regurgitation. TR vena contracta is 0.7 cm. - Pulmonary arteries: PA peak pressure: 25 mm Hg (S). PASP  is likely  underestimated in the setting of significant TR and RV dysfunction     Antibiotics: Vancomycin 7/15>> 7/20 Zosyn 7/15>>7/20 cipro 05/14/14>>  HPI/Subjective: Extreme shiver during dialysis. Reports no chest pain. Complains of feeling cold  Objective: Filed Vitals:   05/14/14 1558  BP: 108/53  Pulse: 160  Temp:   Resp:     Intake/Output Summary (Last 24 hours) at 05/14/14 1611 Last data filed at 05/14/14 0930  Gross per 24 hour  Intake    240 ml  Output      0 ml  Net    240 ml   Filed Weights   05/13/14 0415 05/14/14 0631 05/14/14 1215  Weight: 64.728 kg (142 lb 11.2 oz) 65 kg (143 lb 4.8 oz) 65 kg (143 lb 4.8 oz)    Exam:   General:  Thin frail NAD  Cardiovascular: irregularly irregular and tachycardic no LE edema  Respiratory: normal effort BS clear bilaterally no wheeze  Abdomen: round soft +BS non-tender no guarding   Musculoskeletal: no clubbing no cyanosis   Data Reviewed: Basic Metabolic Panel:  Recent Labs Lab 05/10/14 0450 05/11/14 0418 05/12/14 0524 05/13/14 0606 05/14/14 0618 05/14/14 0622  NA 136* 136* 135* 134*  --  132*  K 4.4 4.6 4.4 5.0  --  6.0*  CL 94* 94* 95* 94*  --  91*  CO2 27 23 25 24   --  26  GLUCOSE 85 87 87 83  --  85  BUN 30* 41* 21 35*  --  43*  CREATININE 7.47* 8.85* 6.53* 7.89*  --  9.61*  CALCIUM 7.9* 8.0* 8.5 9.0  --  8.9  PHOS  --   --  3.7  --  5.1*  --    Liver Function Tests:  Recent Labs Lab 05/09/14 1100 05/10/14 0450  AST 34 38*  ALT 19 22  ALKPHOS 221* 235*  BILITOT 0.9 0.6  PROT 9.2* 7.9  ALBUMIN 3.7 3.1*   No results found for this basename: LIPASE, AMYLASE,  in the last 168 hours No results found for this basename: AMMONIA,  in the last 168 hours CBC:  Recent Labs Lab 05/09/14 1100 05/10/14 0450 05/11/14 0418 05/12/14 0524 05/13/14 0606  WBC 4.9 8.7 7.0 6.0 5.7  NEUTROABS 4.5  --   --   --   --   HGB 10.6* 9.7* 10.4* 10.1* 10.9*  HCT 31.4* 28.9* 30.4* 29.1* 32.1*  MCV 95.7 95.7  94.7 94.5 95.0  PLT 156 146* 168 183 201   Cardiac Enzymes:  Recent Labs Lab 05/09/14 1100 05/09/14 1820 05/09/14 2352  TROPONINI <0.30 <0.30 <0.30   BNP (last 3 results)  Recent Labs  02/12/14 1139 05/09/14 1100  PROBNP 21129.0* 32097.0*   CBG: No results found for this basename: GLUCAP,  in the last 168 hours  Recent Results (from the past 240 hour(s))  CULTURE, BLOOD (ROUTINE X 2)     Status: None   Collection Time    05/09/14 11:13 AM      Result Value Ref Range Status   Specimen Description BLOOD RIGHT HAND   Final   Special Requests BOTTLES DRAWN AEROBIC ONLY 5CC   Final   Culture NO GROWTH 5 DAYS   Final   Report Status 05/14/2014 FINAL   Final  CULTURE, BLOOD (ROUTINE X 2)     Status: None   Collection Time    05/09/14 11:33 AM      Result Value Ref Range Status   Specimen  Description BLOOD LEFT HAND   Final   Special Requests BOTTLES DRAWN AEROBIC AND ANAEROBIC 6CC   Final   Culture NO GROWTH 5 DAYS   Final   Report Status 05/14/2014 FINAL   Final  MRSA PCR SCREENING     Status: Abnormal   Collection Time    05/09/14  3:09 PM      Result Value Ref Range Status   MRSA by PCR POSITIVE (*) NEGATIVE Final   Comment:            The GeneXpert MRSA Assay (FDA     approved for NASAL specimens     only), is one component of a     comprehensive MRSA colonization     surveillance program. It is not     intended to diagnose MRSA     infection nor to guide or     monitor treatment for     MRSA infections.     RESULT CALLED TO, READ BACK BY AND VERIFIED WITH:     SCHONEWITZ,L. AT 1845 ON 05/09/2014 BY BAUGHAM,M.  BODY FLUID CULTURE     Status: None   Collection Time    05/10/14 11:00 AM      Result Value Ref Range Status   Specimen Description PARACENTESIS FLUID   Final   Special Requests NONE   Final   Gram Stain     Final   Value: NO WBC SEEN     NO ORGANISMS SEEN     Performed at [facility]   Culture     Final   Value: NO GROWTH 3 DAYS      Performed at [facility]   Report Status 05/13/2014 FINAL   Final     Studies: No results found.  Scheduled Meds: . allopurinol  100 mg Oral Daily  . aspirin EC  81 mg Oral Daily  . Chlorhexidine Gluconate Cloth  6 each Topical Q0600  . ciprofloxacin  500 mg Oral q1800  . docusate sodium  100 mg Oral BID  . enoxaparin (LOVENOX) injection  30 mg Subcutaneous Q24H  . metoCLOPramide  10 mg Oral TID AC  . midodrine  20 mg Oral TID AC  . multivitamin  1 tablet Oral Daily  . mupirocin ointment  1 application Nasal BID  . pantoprazole  40 mg Oral Daily  . sevelamer carbonate  800 mg Oral TID WC  . sodium chloride  3 mL Intravenous Q12H  . venlafaxine  75 mg Oral BID WC   Continuous Infusions:   Principal Problem:   Sepsis Active Problems:   ESRD (end stage renal disease)   Hypotension   Chest pain   Tachycardia   Dyspnea   Amyloid heart disease   Ascites   Anemia in chronic kidney disease(285.21)   SBP (spontaneous bacterial peritonitis)    Time spent: 40 minutes    [facility] Pager [phone]. If 7PM-7AM, please contact night-coverage at [email or web address], password [facility] 05/14/2014, 4:11 PM  LOS: 5 days

## 2014-05-14 NOTE — Progress Notes (Signed)
Patient has blood pressure of 80/48. Patient asymptomatic. Contacted MD on call to inform of this. Awaiting response.

## 2014-05-15 LAB — CBC
HCT: 31.3 % — ABNORMAL LOW (ref 39.0–52.0)
Hemoglobin: 10.4 g/dL — ABNORMAL LOW (ref 13.0–17.0)
MCH: 31.8 pg (ref 26.0–34.0)
MCHC: 33.2 g/dL (ref 30.0–36.0)
MCV: 95.7 fL (ref 78.0–100.0)
PLATELETS: 213 10*3/uL (ref 150–400)
RBC: 3.27 MIL/uL — ABNORMAL LOW (ref 4.22–5.81)
RDW: 14.5 % (ref 11.5–15.5)
WBC: 21.5 10*3/uL — AB (ref 4.0–10.5)

## 2014-05-15 LAB — COMPREHENSIVE METABOLIC PANEL
ALBUMIN: 2.7 g/dL — AB (ref 3.5–5.2)
ALK PHOS: 239 U/L — AB (ref 39–117)
ALT: 22 U/L (ref 0–53)
ANION GAP: 16 — AB (ref 5–15)
AST: 36 U/L (ref 0–37)
BUN: 27 mg/dL — AB (ref 6–23)
CHLORIDE: 94 meq/L — AB (ref 96–112)
CO2: 24 mEq/L (ref 19–32)
Calcium: 9 mg/dL (ref 8.4–10.5)
Creatinine, Ser: 7.2 mg/dL — ABNORMAL HIGH (ref 0.50–1.35)
GFR calc Af Amer: 8 mL/min — ABNORMAL LOW (ref >90)
GFR calc non Af Amer: 7 mL/min — ABNORMAL LOW (ref >90)
Glucose, Bld: 100 mg/dL — ABNORMAL HIGH (ref 70–99)
POTASSIUM: 4.9 meq/L (ref 3.7–5.3)
SODIUM: 134 meq/L — AB (ref 137–147)
TOTAL PROTEIN: 7.4 g/dL (ref 6.0–8.3)
Total Bilirubin: 0.7 mg/dL (ref 0.3–1.2)

## 2014-05-15 LAB — GLUCOSE, CAPILLARY: GLUCOSE-CAPILLARY: 89 mg/dL (ref 70–99)

## 2014-05-15 MED ORDER — ACETAMINOPHEN 325 MG PO TABS: 650.0000 mg | ORAL_TABLET | Freq: Four times a day (QID) | ORAL | Status: DC | PRN

## 2014-05-15 MED ORDER — VANCOMYCIN HCL IN DEXTROSE 750-5 MG/150ML-% IV SOLN
750.0000 mg | INTRAVENOUS | Status: DC
Start: 2014-05-16 — End: 2014-06-11

## 2014-05-15 MED ORDER — PHENYLEPHRINE HCL 10 MG/ML IJ SOLN
INTRAMUSCULAR | Status: AC
  Filled 2014-05-15: qty 2

## 2014-05-15 MED ORDER — ENOXAPARIN SODIUM 30 MG/0.3ML ~~LOC~~ SOLN: 30.0000 mg | SUBCUTANEOUS | Status: DC

## 2014-05-15 MED ORDER — PHENYLEPHRINE HCL 10 MG/ML IJ SOLN: 30.0000 ug/min | INTRAVENOUS | Status: DC

## 2014-05-15 MED ORDER — PIPERACILLIN-TAZOBACTAM IN DEX 2-0.25 GM/50ML IV SOLN: 2.2500 g | Freq: Three times a day (TID) | INTRAVENOUS | Status: DC

## 2014-05-15 MED ORDER — PHENYLEPHRINE HCL 10 MG/ML IJ SOLN
30.0000 ug/min | INTRAVENOUS | Status: DC
  Administered 2014-05-15: 30 ug/min via INTRAVENOUS
  Filled 2014-05-15: qty 2

## 2014-05-15 NOTE — Progress Notes (Signed)
UR chart review completed.  

## 2014-05-15 NOTE — Progress Notes (Signed)
Subjective: Interval Histosry: No new complaint . Appetite is good  Objective: Vital signs in last 24 hours: Temp:  [97.6 F (36.4 C)-103 F (39.4 C)] 98.4 F (36.9 C) (07/21 0400) Pulse Rate:  [26-162] 59 (07/21 0445) Resp:  [0-41] 24 (07/21 0445) BP: (40-114)/(15-65) 66/42 mmHg (07/21 0445) SpO2:  [75 %-100 %] 97 % (07/21 0445) Weight:  [65 kg (143 lb 4.8 oz)] 65 kg (143 lb 4.8 oz) (07/20 1215) Weight change: 0 kg (0 lb)  Intake/Output from previous day: 07/20 0701 - 07/21 0700 In: 390 [P.O.:240; IV Piggyback:150] Out: -  Intake/Output this shift:    Generally patient is alert in no apparent distress. HEENT exam no conjunctival pallor and none icterus. He has elevated JVD. Chest: Decreased breath sound bilaterally. Heart exam revealed regular rate and rhythm Abdomen:  nontender and positive bowel sound. Extremities trace edema  Lab Results:  Recent Labs  05/13/14 0606 05/15/14 0424  WBC 5.7 21.5*  HGB 10.9* 10.4*  HCT 32.1* 31.3*  PLT 201 213   BMET:   Recent Labs  05/14/14 0622 05/15/14 0424  NA 132* 134*  K 6.0* 4.9  CL 91* 94*  CO2 26 24  GLUCOSE 85 100*  BUN 43* 27*  CREATININE 9.61* 7.20*  CALCIUM 8.9 9.0   No results found for this basename: PTH,  in the last 72 hours Iron Studies: No results found for this basename: IRON, TIBC, TRANSFERRIN, FERRITIN,  in the last 72 hours  Studies/Results: No results found.  I have reviewed the patient's current medications.  Assessment/Plan: Problem #1 chest pain: Patient presently asymptomatic Problem #2 severe cardiomyopathy with low ejection fraction. Presently denies any difficulty breathing. Patient also does not have any orthopnea or paroxysmal nocturnal dyspnea. Problem #3 hypotension: . His on midodrine Problem #4 fever: Patient still with occasionaly fever source not clear. On antibiotics cultures no growth. Patient also with luckocytosis. Repeat culture done Problem #5 anemia: His hemoglobin is  stable. And his hemoglobin hematocrit is was in our target goal. Problem #6 metabolic bone disease: His calcium and phosphorus in the range. Problem#7 Hypekalmia: s/p Dialysis yesterday her potasium has corrected Plan: We will make arrangement for dialysis today. For dialysis in am.  LOS: 6 days   Taino Maertens S 05/15/2014,7:26 AM

## 2014-05-15 NOTE — Discharge Summary (Signed)
Please see discharge summary from 05/15/14  [facility]

## 2014-05-15 NOTE — Progress Notes (Signed)
   NEPHROLOGY NURSING NOTE:  Pt to transfer to [facility] in [place].  At Dr. Blythe Stanford request, Neosynephrine gtt was switched from peripheral IV in right forearm to venous port of right chest tunneled catheter.  Arterial catheter port is SALINE LOCKED as the actual fill volumes are unknown.  [place] L. Adelei Scobey, RN, CDN

## 2014-05-15 NOTE — Progress Notes (Addendum)
PT TRANSFERRED VIA [facility] TO [facility]. REPORT CALLED TO FLOOR  NURSE EARILIER. ALERT AND ORIENTED. NSL X 2 ON RT AREA PATENT. DENIES ANY DISTRESS,BP 70/50.

## 2014-05-15 NOTE — Discharge Summary (Signed)
Physician Discharge Summary  [name] ZSW:[id number] DOB: 1946/08/21 DOA: 05/09/2014  PCP: Glo Herring., MD  Admit date: 05/09/2014 Discharge date: 05/15/2014  Time spent: 60 minutes  Recommendations for transfer:  1. Patient will be transferred to [facility] for further definitive treatments 2. Consider removal of hemodialysis catheter, since this may be infected 3. Consider CT scan of the abdomen and pelvis to further delineate any source of infection. Prior imaging indicated possible omental thickening, but ascitic fluid cytology was negative for malignancy. This may be related to his underlying amyloidosis 4. Consider transesophageal echocardiogram to further evaluate right atrial lesion, thought to be fibrosed catheter tip 5. 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 He also complained of some relative chest pain. His wife had reported similar symptoms occurring during his previous dialysis sessions.  Of note, patient staph bacteremia in 01/2014 and was treated with intravenous antibiotics. was found to have a coagulase-negativeInitial workup in the emergency room did not reveal any clear source of infection. He was started on empiric antibiotics and admitted to the ICU for further treatments.  Hospital Course:   This is a complex 68 year old patient with history  of end-stage renal disease, cardiac amyloidosis, chronic hypotension with blood pressures running in the 70s to 80s which she appears to tolerate.   Sepsis. The patient was initially felt to be septic on admission he was started on vancomycin and Zosyn. He did not have any clear source of infection. Chest x-ray did not indicate any underlying pneumonia and the patient is anuric. He does have a chronic right-sided dialysis catheter which did not have any surrounding erythema, pain or swelling. Chest x-rays indicate that he also has a prior left-sided internal jugular catheter which does not appear to be visible through the skin. This is a chronic finding and is on multiple chest x-rays in the past. Since his admission, the patient became afebrile and blood pressures began to normalize. He never had a significant leukocytosis. Blood cultures sent on admission did not show any significant growth. He was noted to have significant ascites, but did not have any abdominal pain or tenderness. His ascitic fluid was aspirated and fluid analysis showed 622 wbc's with a neutrophilic predominance. Cytology was negative for malignancy. Cultures did not show any significant growth. It was felt that he may have an underlying spontaneous bacterial peritonitis and IV antibiotics were continued. The patient began to clinically improve and did not have any further fevers. He appears to be tolerating his hypotension quite well. Echocardiogram was pursued to rule out any underlying vegetations. This did indicate a large calcified semimobile mass in the right atrium. It was felt to be unlikely related to vegetations. Images were further reviewed with Dr. Harrington Challenger, on call for cardiology and it was felt that this is likely an old, fibrosed catheter tip.  She did not feel that transesophageal echocardiogram would be necessary at this time. Patient continued to improve clinically. Yesterday he was undergoing dialysis when he developed fever of  103, rigors and became increasingly hypotensive. He was transferred back to the ICU for further evaluation. WBC count is noted to be elevated this morning at 21,000. Due to his persistent hypotension overnight, he was started on Neo-Synephrine. Blood cultures were sent yesterday. It is possible that he may have an infected dialysis catheter which needs to be removed. He may also benefit from infectious disease input. Since we do not have these services available at our hospital, we have requested transfer to a higher level of care. I have discussed the case with Dr. Jamse Belfast with critical care at [facility] who has accepted the patient in transfer   Procedures: Ultrasound-guided paracentesis per radiology on 05/10/2014; yielding a 4660 mL.   Echo: - Left ventricle: The cavity size was normal. Wall thickness was increased in a pattern of moderate to severe LVH. Systolic function was normal. The estimated ejection fraction was in the range of 55% to 60%. Wall motion was normal; there were no regional wall motion abnormalities. Features are consistent with a pseudonormal left ventricular filling pattern, with concomitant abnormal relaxation and increased filling pressure (grade 2 diastolic dysfunction). - Aortic valve: Mildly calcified annulus. Trileaflet; mildly thickened leaflets. There was mild regurgitation. Valve area (VTI): 2.98 cm^2. Valve area (Vmax): 2.53 cm^2. - Mitral valve: Mildly calcified annulus. Mildly thickened leaflets . - Left atrium: The atrium was severely dilated. - Right ventricle: The cavity size was moderately dilated. RV TAPSE is 1.3 cm. Systolic function was mildly to moderately reduced. - Right atrium: The atrium was severely dilated. 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 The exact attachment point is unclear, but appears to be attached to either the superior RA wall or potentially originating from the IVC. Differential includes calcified  thrombus or cardiac tumor. Consider TEE for further evaluation. - Atrial septum: No defect or patent foramen ovale was identified. - Tricuspid valve: There was moderate-severe regurgitation. TR vena contracta is 0.7 cm. - Pulmonary arteries: PA peak pressure: 25 mm Hg (S). PASP is likely underestimated in the setting of significant TR and RV dysfunction   Antibiotics:  Vancomycin 7/15>> Zosyn 7/15>>   Consultations:  Nephrology  Discharge Exam: Filed Vitals:   05/15/14 0800  BP:   Pulse:   Temp: 94.1 F (34.5 C)  Resp:     General: NAD, sitting up in bed Cardiovascular: S1, S2 RRR Respiratory: CTA B  Discharge Instructions You were cared for by a hospitalist during your hospital stay. If you have any questions about your discharge medications or the care you received while you were in the hospital after you are discharged, you can call the unit and asked to speak with the hospitalist on call if the hospitalist that took care of you is not available. Once you are discharged, your primary care physician will handle any further medical issues. Please note that NO REFILLS for any discharge medications will be authorized once you are discharged, as it is imperative that you return to your primary care physician (or establish a relationship with a primary care physician if you do not have one) for your aftercare needs so that they can reassess your need for medications and monitor your lab values.     Medication List         acetaminophen 325 MG tablet  Commonly  known as:  TYLENOL  Take 2 tablets (650 mg total) by mouth every 6 (six) hours as needed for mild pain (or Fever >/= 101).     allopurinol 100 MG tablet  Commonly known as:  ZYLOPRIM  Take 100 mg by mouth daily.     aspirin 81 MG tablet  Take 81 mg by mouth daily.     cinacalcet 30 MG tablet  Commonly known as:  SENSIPAR  Take 30 mg by mouth daily.     enoxaparin 30 MG/0.3ML injection  Commonly known as:   LOVENOX  Inject 0.3 mLs (30 mg total) into the skin daily.     metoCLOPramide 10 MG tablet  Commonly known as:  REGLAN  Take 10 mg by mouth Three times a day.     midodrine 10 MG tablet  Commonly known as:  PROAMATINE  Take 20 mg by mouth 3 (three) times daily.     multivitamin Tabs tablet  Take 1 tablet by mouth daily.     omeprazole 20 MG capsule  Commonly known as:  PRILOSEC  Take 20 mg by mouth 2 (two) times daily before a meal.     phenylephrine 20 mg in dextrose 5 % 250 mL  Inject 30-200 mcg/min into the vein continuous.     piperacillin-tazobactam 2-0.25 GM/50ML IVPB  Commonly known as:  ZOSYN  Inject 50 mLs (2.25 g total) into the vein every 8 (eight) hours.     sevelamer carbonate 800 MG tablet  Commonly known as:  RENVELA  Take 800 mg by mouth 3 (three) times daily with meals.     Vancomycin 750 MG/150ML Soln  Commonly known as:  VANCOCIN  Inject 150 mLs (750 mg total) into the vein every Monday, Wednesday, and Friday with hemodialysis.  Start taking on:  05/16/2014     Venlafaxine HCl 75 MG Tb24  Take 75 mg by mouth 2 (two) times daily.       No Known Allergies     Follow-up Information   Follow up with Glo Herring., MD. Schedule an appointment as soon as possible for a visit in 1 week. (evaluation of symptoms)    Specialty:  Internal Medicine   Contact information:   [address] [place] [id number] [phone]        The results of significant diagnostics from this hospitalization (including imaging, microbiology, ancillary and laboratory) are listed below for reference.    Significant Diagnostic Studies: Ct Angio Chest Pe W/cm &/or Wo Cm  05/09/2014   CLINICAL DATA:  Hypoxia.  Chest pain.  EXAM: CT ANGIOGRAPHY CHEST WITH CONTRAST  TECHNIQUE: Multidetector CT imaging of the chest was performed using the standard protocol during bolus administration of intravenous contrast. Multiplanar CT image reconstructions and MIPs were obtained  to evaluate the vascular anatomy.  CONTRAST:  127mL OMNIPAQUE IOHEXOL 350 MG/ML SOLN  COMPARISON:  Chest x-ray 05/09/2014.  FINDINGS: Thoracic aorta is unremarkable. No evidence of aneurysm or dissection. Cardiomegaly. Coronary artery disease. Tiny pericardial effusion. Pulmonary arteries are normal.  No significant mediastinal or hilar adenopathy. Thoracic esophagus unremarkable.  Large airways patent. Bibasilar atelectasis. No focal pulmonary infiltrates. No evidence of mass. No pleural effusion or pneumothorax.  Prominent ascites. Omental soft tissue thickening is noted. This may be related to ascites however omental tumor cannot be excluded and CT of the abdomen pelvis should be considered for further evaluation. Cholecystectomy.  Thyroid unremarkable. No supraclavicular or axillary adenopathy. Bilateral IJ catheters are present. Degenerative changes thoracic spine.  Review of the MIP images confirms the above findings.  IMPRESSION: 1. Cardiomegaly. Coronary disease. Tiny pericardial effusion. No pulmonary embolus. 2. Prominent ascites. Omental soft tissue thickened is noted. CT of the abdomen and pelvis should be considered for further evaluation of these findings.   Electronically Signed   By: Marcello Moores  Register   On: 05/09/2014 15:17   US Abdomen Limited  05/09/2014   CLINICAL DATA:  End-stage renal disease.  EXAM: LIMITED ABDOMEN ULTRASOUND FOR ASCITES  TECHNIQUE: Limited ultrasound survey for ascites was performed in all four abdominal quadrants.  COMPARISON:  Ultrasound 06/01/2013.  FINDINGS: Moderate ascites noted throughout the abdomen.  IMPRESSION: Moderate diffuse ascites.   Electronically Signed   By: Marcello Moores  Register   On: 05/09/2014 15:29   US Paracentesis  05/10/2014   CLINICAL DATA:  End-stage renal disease, ascites, shortness of breath  EXAM: ULTRASOUND GUIDED DIAGNOSTIC AND THERAPEUTIC PARACENTESIS  COMPARISON:  Limited abdomen ultrasound 05/09/2014  PROCEDURE: Procedure, benefits, and risks  of procedure were discussed with patient.  Written informed consent for procedure was obtained.  Time out protocol followed.  Adequate collection of ascites localized in RIGHT lower quadrant.  Skin prepped and draped in usual sterile fashion.  Skin and soft tissues anesthetized with 9 mL of 1% lidocaine.  5 [place] Yueh catheter placed into peritoneal cavity.  4660 ml of peach colored fluid aspirated by vacuum bottle suction.  Procedure tolerated well by patient without immediate complication.  FINDINGS: A total of approximately 4660 mL of ascitic fluid was removed. A fluid sample of 180 mL was sent for laboratory analysis.  IMPRESSION: Successful ultrasound guided paracentesis yielding 4660 mL of ascites.   Electronically Signed   By: Lavonia Dana M.D.   On: 05/10/2014 11:50   Dg Chest Portable 1 View  05/09/2014   CLINICAL DATA:  Chest pain, end-stage renal disease, amyloid cardiomyopathy  EXAM: PORTABLE CHEST - 1 VIEW  COMPARISON:  Portable chest x-ray of February 12, 2014  FINDINGS: The lungs are adequately inflated. There is no focal infiltrate. The interstitial markings are mildly prominent though stable. The cardiac silhouette is top-normal in size but stable. There is no pleural effusion or significant pulmonary vascular and congestion. Two large caliber dialysis type catheters are in place via bilateral internal jugular approach. The tips lie in the mid and distal portions of the SVC. The bony thorax is unremarkable.  IMPRESSION: There is no acute cardiopulmonary abnormality.   Electronically Signed   By: David  Martinique   On: 05/09/2014 11:12    Microbiology: Recent Results (from the past 240 hour(s))  CULTURE, BLOOD (ROUTINE X 2)     Status: None   Collection Time    05/09/14 11:13 AM      Result Value Ref Range Status   Specimen Description BLOOD RIGHT HAND   Final   Special Requests BOTTLES DRAWN AEROBIC ONLY 5CC   Final   Culture NO GROWTH 5 DAYS   Final   Report Status 05/14/2014 FINAL   Final   CULTURE, BLOOD (ROUTINE X 2)     Status: None   Collection Time    05/09/14 11:33 AM      Result Value Ref Range Status   Specimen Description BLOOD LEFT HAND   Final   Special Requests BOTTLES DRAWN AEROBIC AND ANAEROBIC 6CC   Final   Culture NO GROWTH 5 DAYS   Final   Report Status 05/14/2014 FINAL   Final  MRSA PCR SCREENING     Status:  Abnormal   Collection Time    05/09/14  3:09 PM      Result Value Ref Range Status   MRSA by PCR POSITIVE (*) NEGATIVE Final   Comment:            The GeneXpert MRSA Assay (FDA     approved for NASAL specimens     only), is one component of a     comprehensive MRSA colonization     surveillance program. It is not     intended to diagnose MRSA     infection nor to guide or     monitor treatment for     MRSA infections.     RESULT CALLED TO, READ BACK BY AND VERIFIED WITH:     SCHONEWITZ,L. AT 1845 ON 05/09/2014 BY BAUGHAM,M.  BODY FLUID CULTURE     Status: None   Collection Time    05/10/14 11:00 AM      Result Value Ref Range Status   Specimen Description PARACENTESIS FLUID   Final   Special Requests NONE   Final   Gram Stain     Final   Value: NO WBC SEEN     NO ORGANISMS SEEN     Performed at [facility]   Culture     Final   Value: NO GROWTH 3 DAYS     Performed at [facility]   Report Status 05/13/2014 FINAL   Final     Labs: Basic Metabolic Panel:  Recent Labs Lab 05/11/14 0418 05/12/14 0524 05/13/14 0606 05/14/14 0618 05/14/14 0622 05/15/14 0424  NA 136* 135* 134*  --  132* 134*  K 4.6 4.4 5.0  --  6.0* 4.9  CL 94* 95* 94*  --  91* 94*  CO2 23 25 24   --  26 24  GLUCOSE 87 87 83  --  85 100*  BUN 41* 21 35*  --  43* 27*  CREATININE 8.85* 6.53* 7.89*  --  9.61* 7.20*  CALCIUM 8.0* 8.5 9.0  --  8.9 9.0  PHOS  --  3.7  --  5.1*  --   --    Liver Function Tests:  Recent Labs Lab 05/09/14 1100 05/10/14 0450 05/15/14 0424  AST 34 38* 36  ALT 19 22 22   ALKPHOS 221* 235* 239*  BILITOT 0.9 0.6  0.7  PROT 9.2* 7.9 7.4  ALBUMIN 3.7 3.1* 2.7*   No results found for this basename: LIPASE, AMYLASE,  in the last 168 hours No results found for this basename: AMMONIA,  in the last 168 hours CBC:  Recent Labs Lab 05/09/14 1100 05/10/14 0450 05/11/14 0418 05/12/14 0524 05/13/14 0606 05/15/14 0424  WBC 4.9 8.7 7.0 6.0 5.7 21.5*  NEUTROABS 4.5  --   --   --   --   --   HGB 10.6* 9.7* 10.4* 10.1* 10.9* 10.4*  HCT 31.4* 28.9* 30.4* 29.1* 32.1* 31.3*  MCV 95.7 95.7 94.7 94.5 95.0 95.7  PLT 156 146* 168 183 201 213   Cardiac Enzymes:  Recent Labs Lab 05/09/14 1100 05/09/14 1820 05/09/14 2352  TROPONINI <0.30 <0.30 <0.30   BNP: BNP (last 3 results)  Recent Labs  02/12/14 1139 05/09/14 1100  PROBNP 21129.0* 32097.0*   CBG:  Recent Labs Lab 05/14/14 1615 05/14/14 2138 05/15/14 0747  GLUCAP 75 161* 89       Signed:  MEMON,JEHANZEB  [facility] 05/15/2014, 10:02 AM

## 2014-05-16 NOTE — Progress Notes (Signed)
UR chart review completed.  

## 2014-05-21 LAB — CULTURE, BLOOD (ROUTINE X 2)
Culture: NO GROWTH
Culture: NO GROWTH

## 2014-06-05 ENCOUNTER — Emergency Department (HOSPITAL_COMMUNITY): Payer: Medicare Other

## 2014-06-05 ENCOUNTER — Encounter (HOSPITAL_COMMUNITY): Payer: Self-pay | Admitting: Emergency Medicine

## 2014-06-05 ENCOUNTER — Inpatient Hospital Stay (HOSPITAL_COMMUNITY)
Admission: EM | Admit: 2014-06-05 | Discharge: 2014-06-11 | DRG: 469 | Disposition: A | Discharge status: Skilled Nursing Facility | Payer: Medicare Other | Attending: Internal Medicine | Admitting: Internal Medicine

## 2014-06-05 DIAGNOSIS — Z87891 Personal history of nicotine dependence: Secondary | ICD-10-CM | POA: Diagnosis not present

## 2014-06-05 DIAGNOSIS — S72033A Displaced midcervical fracture of unspecified femur, initial encounter for closed fracture: Secondary | ICD-10-CM | POA: Diagnosis present

## 2014-06-05 DIAGNOSIS — I472 Ventricular tachycardia, unspecified: Secondary | ICD-10-CM | POA: Diagnosis not present

## 2014-06-05 DIAGNOSIS — I9589 Other hypotension: Secondary | ICD-10-CM | POA: Diagnosis present

## 2014-06-05 DIAGNOSIS — D631 Anemia in chronic kidney disease: Secondary | ICD-10-CM

## 2014-06-05 DIAGNOSIS — E875 Hyperkalemia: Secondary | ICD-10-CM | POA: Diagnosis present

## 2014-06-05 DIAGNOSIS — E854 Organ-limited amyloidosis: Secondary | ICD-10-CM | POA: Diagnosis present

## 2014-06-05 DIAGNOSIS — Z992 Dependence on renal dialysis: Secondary | ICD-10-CM | POA: Diagnosis not present

## 2014-06-05 DIAGNOSIS — I498 Other specified cardiac arrhythmias: Secondary | ICD-10-CM | POA: Diagnosis present

## 2014-06-05 DIAGNOSIS — D638 Anemia in other chronic diseases classified elsewhere: Secondary | ICD-10-CM | POA: Diagnosis present

## 2014-06-05 DIAGNOSIS — E8589 Other amyloidosis: Secondary | ICD-10-CM | POA: Diagnosis present

## 2014-06-05 DIAGNOSIS — Z7982 Long term (current) use of aspirin: Secondary | ICD-10-CM | POA: Diagnosis not present

## 2014-06-05 DIAGNOSIS — I4891 Unspecified atrial fibrillation: Secondary | ICD-10-CM | POA: Diagnosis present

## 2014-06-05 DIAGNOSIS — I2510 Atherosclerotic heart disease of native coronary artery without angina pectoris: Secondary | ICD-10-CM | POA: Diagnosis present

## 2014-06-05 DIAGNOSIS — S72009A Fracture of unspecified part of neck of unspecified femur, initial encounter for closed fracture: Secondary | ICD-10-CM

## 2014-06-05 DIAGNOSIS — I4729 Other ventricular tachycardia: Secondary | ICD-10-CM | POA: Diagnosis present

## 2014-06-05 DIAGNOSIS — R64 Cachexia: Secondary | ICD-10-CM | POA: Diagnosis present

## 2014-06-05 DIAGNOSIS — K5900 Constipation, unspecified: Secondary | ICD-10-CM | POA: Diagnosis present

## 2014-06-05 DIAGNOSIS — Z8249 Family history of ischemic heart disease and other diseases of the circulatory system: Secondary | ICD-10-CM

## 2014-06-05 DIAGNOSIS — N039 Chronic nephritic syndrome with unspecified morphologic changes: Secondary | ICD-10-CM

## 2014-06-05 DIAGNOSIS — Z7901 Long term (current) use of anticoagulants: Secondary | ICD-10-CM

## 2014-06-05 DIAGNOSIS — N2581 Secondary hyperparathyroidism of renal origin: Secondary | ICD-10-CM | POA: Diagnosis present

## 2014-06-05 DIAGNOSIS — S72002A Fracture of unspecified part of neck of left femur, initial encounter for closed fracture: Secondary | ICD-10-CM

## 2014-06-05 DIAGNOSIS — M25559 Pain in unspecified hip: Secondary | ICD-10-CM | POA: Diagnosis present

## 2014-06-05 DIAGNOSIS — Z79899 Other long term (current) drug therapy: Secondary | ICD-10-CM

## 2014-06-05 DIAGNOSIS — I43 Cardiomyopathy in diseases classified elsewhere: Secondary | ICD-10-CM

## 2014-06-05 DIAGNOSIS — I428 Other cardiomyopathies: Secondary | ICD-10-CM | POA: Diagnosis present

## 2014-06-05 DIAGNOSIS — S72019A Unspecified intracapsular fracture of unspecified femur, initial encounter for closed fracture: Secondary | ICD-10-CM

## 2014-06-05 DIAGNOSIS — I5032 Chronic diastolic (congestive) heart failure: Secondary | ICD-10-CM | POA: Diagnosis present

## 2014-06-05 DIAGNOSIS — I509 Heart failure, unspecified: Secondary | ICD-10-CM | POA: Diagnosis present

## 2014-06-05 DIAGNOSIS — I120 Hypertensive chronic kidney disease with stage 5 chronic kidney disease or end stage renal disease: Secondary | ICD-10-CM | POA: Diagnosis present

## 2014-06-05 DIAGNOSIS — Z905 Acquired absence of kidney: Secondary | ICD-10-CM | POA: Diagnosis not present

## 2014-06-05 DIAGNOSIS — M109 Gout, unspecified: Secondary | ICD-10-CM | POA: Diagnosis present

## 2014-06-05 DIAGNOSIS — N400 Enlarged prostate without lower urinary tract symptoms: Secondary | ICD-10-CM | POA: Diagnosis present

## 2014-06-05 DIAGNOSIS — Z808 Family history of malignant neoplasm of other organs or systems: Secondary | ICD-10-CM | POA: Diagnosis not present

## 2014-06-05 DIAGNOSIS — Y92009 Unspecified place in unspecified non-institutional (private) residence as the place of occurrence of the external cause: Secondary | ICD-10-CM

## 2014-06-05 DIAGNOSIS — K219 Gastro-esophageal reflux disease without esophagitis: Secondary | ICD-10-CM | POA: Diagnosis present

## 2014-06-05 DIAGNOSIS — Z86718 Personal history of other venous thrombosis and embolism: Secondary | ICD-10-CM | POA: Diagnosis not present

## 2014-06-05 DIAGNOSIS — S72012A Unspecified intracapsular fracture of left femur, initial encounter for closed fracture: Secondary | ICD-10-CM

## 2014-06-05 DIAGNOSIS — N186 End stage renal disease: Secondary | ICD-10-CM | POA: Diagnosis present

## 2014-06-05 DIAGNOSIS — E889 Metabolic disorder, unspecified: Secondary | ICD-10-CM

## 2014-06-05 DIAGNOSIS — E639 Nutritional deficiency, unspecified: Secondary | ICD-10-CM | POA: Diagnosis present

## 2014-06-05 DIAGNOSIS — W07XXXA Fall from chair, initial encounter: Secondary | ICD-10-CM | POA: Diagnosis present

## 2014-06-05 HISTORY — DX: Gastro-esophageal reflux disease without esophagitis: K21.9

## 2014-06-05 HISTORY — DX: Chronic atrial fibrillation, unspecified: I48.20

## 2014-06-05 HISTORY — DX: Malignant (primary) neoplasm, unspecified: C80.1

## 2014-06-05 HISTORY — DX: Essential (primary) hypertension: I10

## 2014-06-05 HISTORY — DX: Unspecified osteoarthritis, unspecified site: M19.90

## 2014-06-05 HISTORY — DX: Atherosclerotic heart disease of native coronary artery without angina pectoris: I25.10

## 2014-06-05 LAB — BASIC METABOLIC PANEL
Anion gap: 22 — ABNORMAL HIGH (ref 5–15)
BUN: 21 mg/dL (ref 6–23)
CALCIUM: 8.2 mg/dL — AB (ref 8.4–10.5)
CO2: 21 mEq/L (ref 19–32)
CREATININE: 7.24 mg/dL — AB (ref 0.50–1.35)
Chloride: 98 mEq/L (ref 96–112)
GFR, EST AFRICAN AMERICAN: 8 mL/min — AB (ref >90)
GFR, EST NON AFRICAN AMERICAN: 7 mL/min — AB (ref >90)
Glucose, Bld: 79 mg/dL (ref 70–99)
Potassium: 3.8 mEq/L (ref 3.7–5.3)
Sodium: 141 mEq/L (ref 137–147)

## 2014-06-05 LAB — CBC WITH DIFFERENTIAL/PLATELET
BASOS ABS: 0 10*3/uL (ref 0.0–0.1)
Basophils Relative: 0 % (ref 0–1)
EOS ABS: 0.2 10*3/uL (ref 0.0–0.7)
EOS PCT: 4 % (ref 0–5)
HCT: 28.4 % — ABNORMAL LOW (ref 39.0–52.0)
Hemoglobin: 9.5 g/dL — ABNORMAL LOW (ref 13.0–17.0)
Lymphocytes Relative: 14 % (ref 12–46)
Lymphs Abs: 0.8 10*3/uL (ref 0.7–4.0)
MCH: 33.1 pg (ref 26.0–34.0)
MCHC: 33.5 g/dL (ref 30.0–36.0)
MCV: 99 fL (ref 78.0–100.0)
MONO ABS: 0.5 10*3/uL (ref 0.1–1.0)
Monocytes Relative: 8 % (ref 3–12)
NEUTROS ABS: 4.3 10*3/uL (ref 1.7–7.7)
Neutrophils Relative %: 74 % (ref 43–77)
Platelets: 200 10*3/uL (ref 150–400)
RBC: 2.87 MIL/uL — ABNORMAL LOW (ref 4.22–5.81)
RDW: 15.4 % (ref 11.5–15.5)
WBC: 5.9 10*3/uL (ref 4.0–10.5)

## 2014-06-05 LAB — PROTIME-INR
INR: 1.29 (ref 0.00–1.49)
Prothrombin Time: 16.1 seconds — ABNORMAL HIGH (ref 11.6–15.2)

## 2014-06-05 MED ORDER — SODIUM CHLORIDE 0.9 % IJ SOLN
3.0000 mL | Freq: Two times a day (BID) | INTRAMUSCULAR | Status: DC
  Administered 2014-06-05 – 2014-06-07 (×3): 3 mL via INTRAVENOUS

## 2014-06-05 MED ORDER — VENLAFAXINE HCL ER 75 MG PO CP24
75.0000 mg | ORAL_CAPSULE | Freq: Every day | ORAL | Status: DC
  Administered 2014-06-08 – 2014-06-11 (×4): 75 mg via ORAL
  Filled 2014-06-05 (×7): qty 1

## 2014-06-05 MED ORDER — ONDANSETRON HCL 4 MG/2ML IJ SOLN: 4.0000 mg | Freq: Four times a day (QID) | INTRAMUSCULAR | Status: DC | PRN

## 2014-06-05 MED ORDER — CINACALCET HCL 30 MG PO TABS
90.0000 mg | ORAL_TABLET | Freq: Every day | ORAL | Status: DC
  Administered 2014-06-06 – 2014-06-11 (×5): 90 mg via ORAL
  Filled 2014-06-05 (×8): qty 3

## 2014-06-05 MED ORDER — ENOXAPARIN SODIUM 30 MG/0.3ML ~~LOC~~ SOLN
30.0000 mg | SUBCUTANEOUS | Status: DC
  Administered 2014-06-06: 30 mg via SUBCUTANEOUS
  Filled 2014-06-05 (×2): qty 0.3

## 2014-06-05 MED ORDER — ASPIRIN 81 MG PO CHEW
81.0000 mg | CHEWABLE_TABLET | Freq: Every day | ORAL | Status: DC
  Administered 2014-06-06 – 2014-06-11 (×5): 81 mg via ORAL
  Filled 2014-06-05 (×7): qty 1

## 2014-06-05 MED ORDER — ONDANSETRON HCL 4 MG/2ML IJ SOLN
4.0000 mg | Freq: Once | INTRAMUSCULAR | Status: AC
  Administered 2014-06-05: 4 mg via INTRAVENOUS
  Filled 2014-06-05: qty 2

## 2014-06-05 MED ORDER — HYDROCODONE-ACETAMINOPHEN 5-325 MG PO TABS
1.0000 | ORAL_TABLET | ORAL | Status: DC | PRN
  Administered 2014-06-06 – 2014-06-07 (×7): 1 via ORAL
  Filled 2014-06-05 (×7): qty 1

## 2014-06-05 MED ORDER — ACETAMINOPHEN 325 MG PO TABS: 650.0000 mg | ORAL_TABLET | Freq: Four times a day (QID) | ORAL | Status: DC | PRN

## 2014-06-05 MED ORDER — SEVELAMER CARBONATE 800 MG PO TABS
3200.0000 mg | ORAL_TABLET | Freq: Three times a day (TID) | ORAL | Status: DC
  Administered 2014-06-06 – 2014-06-11 (×12): 3200 mg via ORAL
  Filled 2014-06-05 (×19): qty 4

## 2014-06-05 MED ORDER — SODIUM CHLORIDE 0.9 % IJ SOLN
3.0000 mL | INTRAMUSCULAR | Status: DC | PRN
  Administered 2014-06-08: 3 mL via INTRAVENOUS

## 2014-06-05 MED ORDER — ONDANSETRON HCL 4 MG PO TABS: 4.0000 mg | ORAL_TABLET | Freq: Four times a day (QID) | ORAL | Status: DC | PRN

## 2014-06-05 MED ORDER — POLYETHYLENE GLYCOL 3350 17 G PO PACK: 17.0000 g | PACK | Freq: Every day | ORAL | Status: DC | PRN

## 2014-06-05 MED ORDER — MIDODRINE HCL 5 MG PO TABS
10.0000 mg | ORAL_TABLET | Freq: Three times a day (TID) | ORAL | Status: DC
  Administered 2014-06-06 – 2014-06-11 (×14): 10 mg via ORAL
  Filled 2014-06-05 (×19): qty 2

## 2014-06-05 MED ORDER — ONDANSETRON HCL 4 MG/2ML IJ SOLN: 4.0000 mg | Freq: Three times a day (TID) | INTRAMUSCULAR | Status: DC | PRN

## 2014-06-05 MED ORDER — ASPIRIN 81 MG PO TABS: 81.0000 mg | ORAL_TABLET | Freq: Every day | ORAL | Status: DC

## 2014-06-05 MED ORDER — SODIUM CHLORIDE 0.9 % IV SOLN
250.0000 mL | INTRAVENOUS | Status: DC | PRN
  Administered 2014-06-07: 18:00:00 via INTRAVENOUS

## 2014-06-05 MED ORDER — SODIUM CHLORIDE 0.9 % IJ SOLN
3.0000 mL | Freq: Two times a day (BID) | INTRAMUSCULAR | Status: DC
  Administered 2014-06-08 (×2): 3 mL via INTRAVENOUS

## 2014-06-05 MED ORDER — FENTANYL CITRATE 0.05 MG/ML IJ SOLN
25.0000 ug | Freq: Once | INTRAMUSCULAR | Status: AC
  Administered 2014-06-05: 25 ug via INTRAVENOUS
  Filled 2014-06-05: qty 2

## 2014-06-05 MED ORDER — VENLAFAXINE HCL ER 75 MG PO TB24: 75.0000 mg | ORAL_TABLET | Freq: Every day | ORAL | Status: DC

## 2014-06-05 MED ORDER — SEVELAMER CARBONATE 800 MG PO TABS
800.0000 mg | ORAL_TABLET | Freq: Every day | ORAL | Status: DC | PRN
  Filled 2014-06-05: qty 1

## 2014-06-05 MED ORDER — METOCLOPRAMIDE HCL 10 MG PO TABS
20.0000 mg | ORAL_TABLET | Freq: Three times a day (TID) | ORAL | Status: DC
  Administered 2014-06-06 – 2014-06-11 (×13): 20 mg via ORAL
  Filled 2014-06-05 (×20): qty 2

## 2014-06-05 MED ORDER — ALLOPURINOL 100 MG PO TABS
100.0000 mg | ORAL_TABLET | Freq: Every day | ORAL | Status: DC
  Administered 2014-06-06 – 2014-06-11 (×4): 100 mg via ORAL
  Filled 2014-06-05 (×6): qty 1

## 2014-06-05 MED ORDER — HYDROMORPHONE HCL PF 1 MG/ML IJ SOLN
1.0000 mg | INTRAMUSCULAR | Status: DC | PRN
  Administered 2014-06-05 – 2014-06-11 (×8): 1 mg via INTRAVENOUS
  Filled 2014-06-05 (×7): qty 1

## 2014-06-05 NOTE — Consult Note (Signed)
ORTHOPAEDIC CONSULTATION  REQUESTING PHYSICIAN: Charlynne Cousins, MD  Chief Complaint: left hip fracture  HPI: [name] is a 68 y.o. male who complains of left hip fracture s/p mechanical fall.  Ambulates w/o assistive devices mainly at home.  Denies LOC, neck pain, abd pain.  Past Medical History  Diagnosis Date  . Hypotension   . Amyloid heart disease   . Cardiomyopathy   . Gout   . BPH (benign prostatic hypertrophy)   . ESRD (end stage renal disease) on dialysis    Past Surgical History  Procedure Laterality Date  . Cholecystectomy    . Hernia repair    . Av fistula placement    . Nephrectomy      left  . Colonoscopy  07/19/2012    Procedure: COLONOSCOPY;  Surgeon: Rogene Houston, MD;  Location: [facility];  Service: Endoscopy;  Laterality: N/A;  730   History   Social History  . Marital Status: Married    Spouse Name: N/A    Number of Children: N/A  . Years of Education: N/A   Social History Main Topics  . Smoking status: Former Smoker -- 0.25 packs/day for 2 years    Types: Cigarettes    Quit date: 09/25/1981  . Smokeless tobacco: None  . Alcohol Use: No  . Drug Use: No  . Sexual Activity: No   Other Topics Concern  . None   Social History Narrative  . None   Family History  Problem Relation Age of Onset  . Cancer Mother   . Bone cancer Father   . Heart failure Father    No Known Allergies Prior to Admission medications   Medication Sig Start Date End Date Taking? Authorizing Provider  acetaminophen (TYLENOL) 325 MG tablet Take 2 tablets (650 mg total) by mouth every 6 (six) hours as needed for mild pain (or Fever >/= 101). 05/15/14  Yes Kathie Dike, MD  allopurinol (ZYLOPRIM) 100 MG tablet Take 100 mg by mouth daily.   Yes Historical Provider, MD  aspirin 81 MG tablet Take 81 mg by mouth daily.   Yes Historical Provider, MD  cinacalcet (SENSIPAR) 90 MG tablet Take 90 mg by mouth daily. With evening meal   Yes Historical Provider,  MD  HYDROcodone-acetaminophen (NORCO/VICODIN) 5-325 MG per tablet Take 1 tablet by mouth every 4 (four) hours as needed for moderate pain.   Yes Historical Provider, MD  metoCLOPramide (REGLAN) 10 MG tablet Take 20 mg by mouth Three times a day.  06/27/12  Yes Historical Provider, MD  midodrine (PROAMATINE) 10 MG tablet Take 10 mg by mouth 3 (three) times daily.  12/03/11  Yes Historical Provider, MD  multivitamin (RENA-VIT) TABS tablet Take 1 tablet by mouth daily.   Yes Historical Provider, MD  omeprazole (PRILOSEC) 20 MG capsule Take 40 mg by mouth 2 (two) times daily before a meal.    Yes Historical Provider, MD  phenylephrine 20 mg in dextrose 5 % 250 mL Inject 30-200 mcg/min into the vein continuous. 05/15/14  Yes Kathie Dike, MD  piperacillin-tazobactam (ZOSYN) 2-0.25 GM/50ML IVPB Inject 50 mLs (2.25 g total) into the vein every 8 (eight) hours. 05/15/14  Yes Kathie Dike, MD  sevelamer (RENVELA) 800 MG tablet Take 3,200 mg by mouth 3 (three) times daily with meals. 3200 mg (4 tablets) with meals and 800 mg (1 tablet) with snacks   Yes Historical Provider, MD  Vancomycin (VANCOCIN) 750 MG/150ML SOLN Inject 150 mLs (750 mg total) into  the vein every Monday, Wednesday, and Friday with hemodialysis. 05/16/14  Yes Kathie Dike, MD  Venlafaxine HCl 75 MG TB24 Take 75 mg by mouth daily.    Yes Historical Provider, MD  enoxaparin (LOVENOX) 30 MG/0.3ML injection Inject 0.3 mLs (30 mg total) into the skin daily. 05/15/14   Kathie Dike, MD  levofloxacin (LEVAQUIN) 500 MG tablet Take 500 mg by mouth every other day. For a total of 3 doses    Historical Provider, MD   Dg Chest 1 View  06/05/2014   CLINICAL DATA:  Pre operative respiratory exam.  Left hip fracture.  EXAM: CHEST - 1 VIEW  COMPARISON:  CT scan and chest x-ray dated 05/09/2014  FINDINGS: There is chronic cardiomegaly. Dialysis catheter with the tip in the right atrium. Lungs are clear. Pulmonary vascularity is normal. No acute osseous  abnormality.  IMPRESSION: No acute abnormality.  Chronic cardiomegaly.   Electronically Signed   By: Rozetta Nunnery M.D.   On: 06/05/2014 15:15   Dg Hip Complete Left  06/05/2014   CLINICAL DATA:  Left hip pain secondary to a fall.  EXAM: LEFT HIP - COMPLETE 2+ VIEW  COMPARISON:  None.  FINDINGS: There is an angulated overriding subcapital fracture of the left femoral neck. Osteopenia. Pelvic bones are intact.  IMPRESSION: Angulated subcapital fracture of the proximal left femur.   Electronically Signed   By: Rozetta Nunnery M.D.   On: 06/05/2014 15:13   Dg Femur Left Port  06/05/2014   CLINICAL DATA:  Status post fall  EXAM: PORTABLE LEFT FEMUR - 2 VIEW  COMPARISON:  None.  FINDINGS: Mildly displaced and angulated left femoral neck fracture. No dislocation. No lytic or sclerotic osseous lesion. Tricompartmental osteoarthritis of the left knee.  IMPRESSION: Mildly displaced and angulated left femoral neck fracture.   Electronically Signed   By: Kathreen Devoid   On: 06/05/2014 16:24    Positive ROS: All other systems have been reviewed and were otherwise negative with the exception of those mentioned in the HPI and as above.  Physical Exam: General: Alert, no acute distress Cardiovascular: No pedal edema Respiratory: No cyanosis, no use of accessory musculature GI: No organomegaly, abdomen is soft and non-tender Skin: No lesions in the area of chief complaint Neurologic: Sensation intact distally Psychiatric: Patient is competent for consent with normal mood and affect Lymphatic: No axillary or cervical lymphadenopathy  MUSCULOSKELETAL:  - painful ROM of hip - dialysis catheter in left proximal thigh - skin intact - NVI LLE  Assessment: Left hip fracture  Plan: - patient will need hip hemiarthroplasty - patient and family understand they are at increased risk for complications due to baseline comorbidities - dialysis catheter is in the planned surgical field and needs to be moved prior to  surgery - I have discussed case with Dr. Ninfa Linden who has agreed to take over care, he will see patient in the am - patient and family in agreement to be seen by Dr. Ninfa Linden - surgery would possibly be Thursday am  Thank you for the consult and the opportunity to see [name]. Eduard Roux, MD [place] [phone] 6:06 PM

## 2014-06-05 NOTE — ED Notes (Signed)
Per EMS pt fell out of chair and landed on L hip. L leg external rotation. EMS vitals 94/48, 71HR, [id number], 98% RM air. A&Ox4. Pt is a dialysis pt.

## 2014-06-05 NOTE — ED Provider Notes (Signed)
CSN: [id number]     Arrival date & time 06/05/14  13 History   First MD Initiated Contact with Patient 06/05/14 1422     Chief Complaint  Patient presents with  . Hip Pain  . Fall     (Consider location/radiation/quality/duration/timing/severity/associated sxs/prior Treatment) HPI Comments: The patient is a 68 year old male past medical history of CKD, on HD, hypertension, amyloid heart disease presents emergency room chief complaint of left hip pain after a fall at home today. The patient reports a mechanical fall while trying to hang a door reports falling and then feel while sitting on his chair onto his Left hip. Denies syncope denies blow to head denies chest pain, shortness breath. Denies anticoagulation use other than ASA 81 mg. Patient on hemodialysis last dialysis yesterday, normally dialyzes Monday, Wednesday, Friday.  Last Oral intake 1100 today.  Patient is a 68 y.o. male presenting with hip pain and fall. The history is provided by the patient. No language interpreter was used.  Hip Pain Associated symptoms include arthralgias. Pertinent negatives include no abdominal pain, chest pain, chills or fever.  Fall Associated symptoms include arthralgias. Pertinent negatives include no abdominal pain, chest pain, chills or fever.    Past Medical History  Diagnosis Date  . Hypotension   . Amyloid heart disease   . Cardiomyopathy   . Gout   . BPH (benign prostatic hypertrophy)   . ESRD (end stage renal disease) on dialysis    Past Surgical History  Procedure Laterality Date  . Cholecystectomy    . Hernia repair    . Av fistula placement    . Nephrectomy      left  . Colonoscopy  07/19/2012    Procedure: COLONOSCOPY;  Surgeon: Rogene Houston, MD;  Location: [facility];  Service: Endoscopy;  Laterality: N/A;  730   History reviewed. No pertinent family history. History  Substance Use Topics  . Smoking status: Former Smoker -- 0.25 packs/day for 2 years    Types:  Cigarettes    Quit date: 09/25/1981  . Smokeless tobacco: Not on file  . Alcohol Use: No    Review of Systems  Constitutional: Negative for fever and chills.  Respiratory: Negative for shortness of breath.   Cardiovascular: Negative for chest pain.  Gastrointestinal: Negative for abdominal pain.  Musculoskeletal: Positive for arthralgias and gait problem.  Neurological: Negative for light-headedness.      Allergies  Review of patient's allergies indicates no known allergies.  Home Medications   Prior to Admission medications   Medication Sig Start Date End Date Taking? Authorizing Provider  acetaminophen (TYLENOL) 325 MG tablet Take 2 tablets (650 mg total) by mouth every 6 (six) hours as needed for mild pain (or Fever >/= 101). 05/15/14  Yes Kathie Dike, MD  allopurinol (ZYLOPRIM) 100 MG tablet Take 100 mg by mouth daily.   Yes Historical Provider, MD  aspirin 81 MG tablet Take 81 mg by mouth daily.   Yes Historical Provider, MD  cinacalcet (SENSIPAR) 90 MG tablet Take 90 mg by mouth daily. With evening meal   Yes Historical Provider, MD  HYDROcodone-acetaminophen (NORCO/VICODIN) 5-325 MG per tablet Take 1 tablet by mouth every 4 (four) hours as needed for moderate pain.   Yes Historical Provider, MD  metoCLOPramide (REGLAN) 10 MG tablet Take 20 mg by mouth Three times a day.  06/27/12  Yes Historical Provider, MD  midodrine (PROAMATINE) 10 MG tablet Take 10 mg by mouth 3 (three) times daily.  12/03/11  Yes Historical Provider, MD  multivitamin (RENA-VIT) TABS tablet Take 1 tablet by mouth daily.   Yes Historical Provider, MD  omeprazole (PRILOSEC) 20 MG capsule Take 40 mg by mouth 2 (two) times daily before a meal.    Yes Historical Provider, MD  phenylephrine 20 mg in dextrose 5 % 250 mL Inject 30-200 mcg/min into the vein continuous. 05/15/14  Yes Kathie Dike, MD  piperacillin-tazobactam (ZOSYN) 2-0.25 GM/50ML IVPB Inject 50 mLs (2.25 g total) into the vein every 8 (eight)  hours. 05/15/14  Yes Kathie Dike, MD  sevelamer (RENVELA) 800 MG tablet Take 3,200 mg by mouth 3 (three) times daily with meals. 3200 mg (4 tablets) with meals and 800 mg (1 tablet) with snacks   Yes Historical Provider, MD  Vancomycin (VANCOCIN) 750 MG/150ML SOLN Inject 150 mLs (750 mg total) into the vein every Monday, Wednesday, and Friday with hemodialysis. 05/16/14  Yes Kathie Dike, MD  Venlafaxine HCl 75 MG TB24 Take 75 mg by mouth daily.    Yes Historical Provider, MD  enoxaparin (LOVENOX) 30 MG/0.3ML injection Inject 0.3 mLs (30 mg total) into the skin daily. 05/15/14   Kathie Dike, MD  levofloxacin (LEVAQUIN) 500 MG tablet Take 500 mg by mouth every other day. For a total of 3 doses    Historical Provider, MD   BP 100/53  Pulse 78  Temp(Src) 97.8 F (36.6 C) (Oral)  Resp 22  SpO2 98% Physical Exam  Nursing note and vitals reviewed. Constitutional: He appears well-developed and well-nourished. No distress.  HENT:  Head: Normocephalic and atraumatic.  Neck: Neck supple.  Cardiovascular: Normal rate and regular rhythm.   Pulmonary/Chest: Effort normal. No respiratory distress. He has no decreased breath sounds. He has no wheezes. He has no rhonchi.  Abdominal: Soft. There is no tenderness. There is no rebound and no guarding.  Musculoskeletal:       Left hip: He exhibits decreased range of motion, tenderness and deformity.       Left knee: No tenderness found.  Left leg shortened, externally rotated.  Skin: Skin is warm and dry. He is not diaphoretic.  Psychiatric: He has a normal mood and affect. His behavior is normal.    ED Course  Procedures (including critical care time) Labs Review Results for orders placed during the hospital encounter of 06/05/14  CBC WITH DIFFERENTIAL      Result Value Ref Range   WBC 5.9  4.0 - 10.5 K/uL   RBC 2.87 (*) 4.22 - 5.81 MIL/uL   Hemoglobin 9.5 (*) 13.0 - 17.0 g/dL   HCT 28.4 (*) 39.0 - 52.0 %   MCV 99.0  78.0 - 100.0 fL   MCH  33.1  26.0 - 34.0 pg   MCHC 33.5  30.0 - 36.0 g/dL   RDW 15.4  11.5 - 15.5 %   Platelets 200  150 - 400 K/uL   Neutrophils Relative % 74  43 - 77 %   Neutro Abs 4.3  1.7 - 7.7 K/uL   Lymphocytes Relative 14  12 - 46 %   Lymphs Abs 0.8  0.7 - 4.0 K/uL   Monocytes Relative 8  3 - 12 %   Monocytes Absolute 0.5  0.1 - 1.0 K/uL   Eosinophils Relative 4  0 - 5 %   Eosinophils Absolute 0.2  0.0 - 0.7 K/uL   Basophils Relative 0  0 - 1 %   Basophils Absolute 0.0  0.0 - 0.1 K/uL  BASIC METABOLIC PANEL  Result Value Ref Range   Sodium 141  137 - 147 mEq/L   Potassium 3.8  3.7 - 5.3 mEq/L   Chloride 98  96 - 112 mEq/L   CO2 21  19 - 32 mEq/L   Glucose, Bld 79  70 - 99 mg/dL   BUN 21  6 - 23 mg/dL   Creatinine, Ser 7.24 (*) 0.50 - 1.35 mg/dL   Calcium 8.2 (*) 8.4 - 10.5 mg/dL   GFR calc non Af Amer 7 (*) >90 mL/min   GFR calc Af Amer 8 (*) >90 mL/min   Anion gap 22 (*) 5 - 15  PROTIME-INR      Result Value Ref Range   Prothrombin Time 16.1 (*) 11.6 - 15.2 seconds   INR 1.29  0.00 - 1.49   Dg Chest 1 View  06/05/2014   CLINICAL DATA:  Pre operative respiratory exam.  Left hip fracture.  EXAM: CHEST - 1 VIEW  COMPARISON:  CT scan and chest x-ray dated 05/09/2014  FINDINGS: There is chronic cardiomegaly. Dialysis catheter with the tip in the right atrium. Lungs are clear. Pulmonary vascularity is normal. No acute osseous abnormality.  IMPRESSION: No acute abnormality.  Chronic cardiomegaly.   Electronically Signed   By: Rozetta Nunnery M.D.   On: 06/05/2014 15:15   Dg Hip Complete Left  06/05/2014   CLINICAL DATA:  Left hip pain secondary to a fall.  EXAM: LEFT HIP - COMPLETE 2+ VIEW  COMPARISON:  None.  FINDINGS: There is an angulated overriding subcapital fracture of the left femoral neck. Osteopenia. Pelvic bones are intact.  IMPRESSION: Angulated subcapital fracture of the proximal left femur.   Electronically Signed   By: Rozetta Nunnery M.D.   On: 06/05/2014 15:13     Imaging Review No  results found.  EKG: Date: 06/05/2014  Rate: 76  Rhythm: atrial fibrillation  QRS Axis: left  Intervals: QT prolonged  ST/T Wave abnormalities: normal  Conduction Disutrbances:nonspecific intraventricular conduction delay  Narrative Interpretation:  Old EKG Reviewed: unchanged and changes noted; new QT prolongation, otherwise no significant changes compared to previous EKG dated 05/10/2014.   MDM   Final diagnoses:  Closed left hip fracture, initial encounter  ESRD on hemodialysis   Patient presents with mechanical fall, exam concerning for left femur fracture. X-ray shows angulated subcapital fracture of the proximal left femur. Consult to Ortho surgery, consult to internal medicine for admission. Dr. Erlinda Hong to consult Dr. Aileen Fass to admit the patient. Meds given in ED:  Medications  HYDROmorphone (DILAUDID) injection 1 mg (not administered)  fentaNYL (SUBLIMAZE) injection 25 mcg (25 mcg Intravenous Given 06/05/14 1620)  ondansetron (ZOFRAN) injection 4 mg (4 mg Intravenous Given 06/05/14 1619)    New Prescriptions   No medications on file      Harvie Heck, PA-C 06/05/14 1653

## 2014-06-05 NOTE — Consult Note (Signed)
Pt. Seen and examined. Agree with the NP/PA-C note as written. 68 yo male with biopsy-proven cardiac amyloidosis, followed now by [facility], presents with fall and hip fracture. Cardiology is asked to provide risk assessment prior to surgery.  He is at least intermediate risk for decompensated heart failure as a result of surgery as well as arrhythmic events, however, there are no other options as this is an emergent procedure. Would recommend proceeding as planned with surgery and he understands the risk. Continue current cardiac medications. Follow-up after discharge with his cardiologist at [facility].  Pixie Casino, MD, [facility] Attending Cardiologist [place]

## 2014-06-05 NOTE — Consult Note (Signed)
Reason for Consult: surgical clearance   Referring Physician: Dr. Olevia Bowens PCP:  Glo Herring., MD Primary Cardiologist: new- previously Dr. Mathis Bud-- followed in [place] is an 68 y.o. male.    Chief Complaint:  [place] with fracture hip- lt no syncope   HPI: 68 year old AAM with cardiac hx of cath in 2004 for Abnormal Cardiolite study with revealing nonobstructive coronary disease with a 30% right coronary artery, 30% circumflex and 30% narrowing in the left anterior descending.  2. Mild left ventricular dysfunction with an ejection fraction of 45-50%.  3. Hypertension with left ventricular hypertrophy.  4. Question of amyloidosis. Pathology this admission did not indicate  evidence of amyloidosis or sarcoidosis. A biopsy has been sent to the  [facility]. And was positive,  He is now followed at [facility].  Pt is also ESRD with dialysis on MWF.  History of prior left nephrectomy.  Recent admit for sepsis at [facility] then transferred to [facility]. A PVL and echo was performed on 7/22 to evaluate his vasculature, in case central venous access would have to be obtained, and an incidental acute upper extremity DVT was found in his right internal jugular vein. The patient was started on a heparin drip. TTE also showed that the tunneled catheter tip was in the right atrium, and the catheter was eventually removed by VIR at the bedside. Since then his MAPs have improved to the 60s and 70s. Blood cultures drawn on 7/23 had not grown anything for 5 days as of when the patient was discharged. Pt has tachycardia during the admit and chest pain, though enzymes were negative.  He was discharged on 05/24/14 with plans to follow up with a PCP at [facility] in [place] on 8/5 in order to discuss the risks and benefits of starting anticoagulation for his RIJ thrombus. His dialysis center ([facility] in [place]) was contacted, with the plan to schedule  dialysis on Saturday, 8/1 instead of 7/31, given his dialysis on 7/30. He will resume his normal MWF schedule on 8/3. He was discharged with PO Levaquin to be taken every other day until 8/5 (total antibiotic course 7/25-8/5).   Recent Echo;  Left ventricle: The cavity size was normal. Wall thickness was increased in a pattern of moderate to severe LVH. Systolic function was normal. The estimated ejection fraction was in the range of 55% to 60%. Wall motion was normal; there were no regional wall motion abnormalities. Features are consistent with a pseudonormal left ventricular filling pattern, with concomitant abnormal relaxation and increased filling pressure (grade 2 diastolic dysfunction). - Aortic valve: Mildly calcified annulus. Trileaflet; mildly thickened leaflets. There was mild regurgitation. Valve area (VTI): 2.98 cm^2. Valve area (Vmax): 2.53 cm^2. - Mitral valve: Mildly calcified annulus. Mildly thickened leaflets. - Left atrium: The atrium was severely dilated. - Right ventricle: The cavity size was moderately dilated. RV TAPSE is 1.3 cm. Systolic function was mildly to moderately reduced. - Right atrium: The atrium was severely dilated. 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 The exact attachment point is unclear, but appears to be attached to either the superior RA wall or potentially originating from the SVC. Differential includes calcified thrombus or cardiac tumor. Consider TEE for further evaluation. - Atrial septum: No defect or patent foramen ovale was identified. - Tricuspid valve: There was moderate-severe regurgitation. TR vena contracta is 0.7 cm. - Pulmonary arteries: PA  peak pressure: 25 mm Hg (S). PASP is likely underestimated in the setting of significant TR and RV dysfunction.   EKG low voltage, typical for amyloid.  Appears atrial fib.        Past Medical History  Diagnosis Date  . Hypotension   . Amyloid heart disease     . Cardiomyopathy   . Gout   . BPH (benign prostatic hypertrophy)   . ESRD (end stage renal disease) on dialysis     Past Surgical History  Procedure Laterality Date  . Cholecystectomy    . Hernia repair    . Av fistula placement    . Nephrectomy      left  . Colonoscopy  07/19/2012    Procedure: COLONOSCOPY;  Surgeon: Rogene Houston, MD;  Location: [facility];  Service: Endoscopy;  Laterality: N/A;  730    Family History  Problem Relation Age of Onset  . Cancer Mother   . Bone cancer Father   . Heart failure Father    Social History:  reports that he quit smoking about 32 years ago. His smoking use included Cigarettes. He has a .5 pack-year smoking history. He does not have any smokeless tobacco history on file. He reports that he does not drink alcohol or use illicit drugs.  Allergies: No Known Allergies  Outpatient Meds:  No current facility-administered medications on file prior to encounter.   Current Outpatient Prescriptions on File Prior to Encounter  Medication Sig Dispense Refill  . acetaminophen (TYLENOL) 325 MG tablet Take 2 tablets (650 mg total) by mouth every 6 (six) hours as needed for mild pain (or Fever >/= 101).      Marland Kitchen allopurinol (ZYLOPRIM) 100 MG tablet Take 100 mg by mouth daily.      Marland Kitchen aspirin 81 MG tablet Take 81 mg by mouth daily.      . metoCLOPramide (REGLAN) 10 MG tablet Take 20 mg by mouth Three times a day.       . midodrine (PROAMATINE) 10 MG tablet Take 10 mg by mouth 3 (three) times daily.       . multivitamin (RENA-VIT) TABS tablet Take 1 tablet by mouth daily.      Marland Kitchen omeprazole (PRILOSEC) 20 MG capsule Take 40 mg by mouth 2 (two) times daily before a meal.       . phenylephrine 20 mg in dextrose 5 % 250 mL Inject 30-200 mcg/min into the vein continuous.      . piperacillin-tazobactam (ZOSYN) 2-0.25 GM/50ML IVPB Inject 50 mLs (2.25 g total) into the vein every 8 (eight) hours.  50 mL    . sevelamer (RENVELA) 800 MG tablet Take 3,200 mg by  mouth 3 (three) times daily with meals. 3200 mg (4 tablets) with meals and 800 mg (1 tablet) with snacks      . Vancomycin (VANCOCIN) 750 MG/150ML SOLN Inject 150 mLs (750 mg total) into the vein every Monday, Wednesday, and Friday with hemodialysis.      . Venlafaxine HCl 75 MG TB24 Take 75 mg by mouth daily.       Marland Kitchen enoxaparin (LOVENOX) 30 MG/0.3ML injection Inject 0.3 mLs (30 mg total) into the skin daily.  0 Syringe      Results for orders placed during the hospital encounter of 06/05/14 (from the past 48 hour(s))  CBC WITH DIFFERENTIAL     Status: Abnormal   Collection Time    06/05/14  2:24 PM      Result Value Ref  Range   WBC 5.9  4.0 - 10.5 K/uL   RBC 2.87 (*) 4.22 - 5.81 MIL/uL   Hemoglobin 9.5 (*) 13.0 - 17.0 g/dL   HCT 28.4 (*) 39.0 - 52.0 %   MCV 99.0  78.0 - 100.0 fL   MCH 33.1  26.0 - 34.0 pg   MCHC 33.5  30.0 - 36.0 g/dL   RDW 15.4  11.5 - 15.5 %   Platelets 200  150 - 400 K/uL   Neutrophils Relative % 74  43 - 77 %   Neutro Abs 4.3  1.7 - 7.7 K/uL   Lymphocytes Relative 14  12 - 46 %   Lymphs Abs 0.8  0.7 - 4.0 K/uL   Monocytes Relative 8  3 - 12 %   Monocytes Absolute 0.5  0.1 - 1.0 K/uL   Eosinophils Relative 4  0 - 5 %   Eosinophils Absolute 0.2  0.0 - 0.7 K/uL   Basophils Relative 0  0 - 1 %   Basophils Absolute 0.0  0.0 - 0.1 K/uL  BASIC METABOLIC PANEL     Status: Abnormal   Collection Time    06/05/14  2:24 PM      Result Value Ref Range   Sodium 141  137 - 147 mEq/L   Potassium 3.8  3.7 - 5.3 mEq/L   Chloride 98  96 - 112 mEq/L   CO2 21  19 - 32 mEq/L   Glucose, Bld 79  70 - 99 mg/dL   BUN 21  6 - 23 mg/dL   Creatinine, Ser 7.24 (*) 0.50 - 1.35 mg/dL   Calcium 8.2 (*) 8.4 - 10.5 mg/dL   GFR calc non Af Amer 7 (*) >90 mL/min   GFR calc Af Amer 8 (*) >90 mL/min   Comment: (NOTE)     The eGFR has been calculated using the CKD EPI equation.     This calculation has not been validated in all clinical situations.     eGFR's persistently <90 mL/min  signify possible Chronic Kidney     Disease.   Anion gap 22 (*) 5 - 15  PROTIME-INR     Status: Abnormal   Collection Time    06/05/14  2:24 PM      Result Value Ref Range   Prothrombin Time 16.1 (*) 11.6 - 15.2 seconds   INR 1.29  0.00 - 1.49   Dg Chest 1 View  06/05/2014   CLINICAL DATA:  Pre operative respiratory exam.  Left hip fracture.  EXAM: CHEST - 1 VIEW  COMPARISON:  CT scan and chest x-ray dated 05/09/2014  FINDINGS: There is chronic cardiomegaly. Dialysis catheter with the tip in the right atrium. Lungs are clear. Pulmonary vascularity is normal. No acute osseous abnormality.  IMPRESSION: No acute abnormality.  Chronic cardiomegaly.   Electronically Signed   By: Rozetta Nunnery M.D.   On: 06/05/2014 15:15   Dg Hip Complete Left  06/05/2014   CLINICAL DATA:  Left hip pain secondary to a fall.  EXAM: LEFT HIP - COMPLETE 2+ VIEW  COMPARISON:  None.  FINDINGS: There is an angulated overriding subcapital fracture of the left femoral neck. Osteopenia. Pelvic bones are intact.  IMPRESSION: Angulated subcapital fracture of the proximal left femur.   Electronically Signed   By: Rozetta Nunnery M.D.   On: 06/05/2014 15:13   Dg Femur Left Port  06/05/2014   CLINICAL DATA:  Status post fall  EXAM: PORTABLE LEFT FEMUR - 2  VIEW  COMPARISON:  None.  FINDINGS: Mildly displaced and angulated left femoral neck fracture. No dislocation. No lytic or sclerotic osseous lesion. Tricompartmental osteoarthritis of the left knee.  IMPRESSION: Mildly displaced and angulated left femoral neck fracture.   Electronically Signed   By: Kathreen Devoid   On: 06/05/2014 16:24    ROS: General:no colds or fevers, no weight changes Skin:no rashes or ulcers HEENT:no blurred vision, no congestion CV:see HPI PUL:see HPI GI:no diarrhea constipation or melena, no indigestion GU:no hematuria, no dysuria- dialysis MS:no joint pain, no claudication Neuro:no syncope, no lightheadedness Endo:no diabetes, no thyroid disease     Blood pressure 82/58, pulse 80, temperature 97.8 F (36.6 C), temperature source Oral, resp. rate 10, SpO2 100.00%. PE: General:Pleasant affect, NAD Skin:Warm and dry, brisk capillary refill HEENT:normocephalic, sclera clear, mucus membranes moist Neck:supple, + JVD especially on rt, previous cath there, no bruits  Heart:S1S2 RRR without murmur, gallup, rub or click Lungs:clear ant, without rales, rhonchi, or wheezes KDX:IPJA, non tender, + BS, do not palpate liver spleen or masses Ext:no lower ext edema, 2+ pedal pulses, 2+ radial pulses Neuro:alert and oriented X 3, MAE, follows commands, + facial symmetry    Assessment/Plan Principal Problem:   Subcapital fracture of femur Active Problems:   ESRD (end stage renal disease)   Amyloid heart disease   Chronic diastolic congestive heart failure, NYHA class 2   Closed left hip fracture    Atrial fib, no anticoagulation     Recent RIJ thrombus   [facility]  Nurse Practitioner Certified [place] Pager [phone] or after 5pm or weekends call [phone] 06/05/2014, 5:53 PM

## 2014-06-05 NOTE — ED Provider Notes (Signed)
Medical screening examination/treatment/procedure(s) were conducted as a shared visit with non-physician practitioner(s) and myself.  I personally evaluated the patient during the encounter. 68yo M, c/o left hip pain that began after he fell out of a chair. States he landed onto his left hip. Unable to weight bear d/t pain. VS per baseline, A&O, CTA, RRR, +TTP left hip with shortening and external rotation. XR with fx. Labs per baseline. Ortho consult, medicine admit.    MDM Reviewed: previous chart, nursing note and vitals Reviewed previous: labs and ECG Interpretation: labs, ECG and x-ray    Date: 06/05/2014  Rate: 76  Rhythm: atrial fibrillation  QRS Axis: left  Intervals: QT prolonged  ST/T Wave abnormalities: normal  Conduction Disutrbances:nonspecific intraventricular conduction delay  Narrative Interpretation:   Old EKG Reviewed: unchanged and changes noted; new QT prolongation, otherwise no significant changes compared to previous EKG dated 05/10/2014.   Dg Chest 1 View 06/05/2014   CLINICAL DATA:  Pre operative respiratory exam.  Left hip fracture.  EXAM: CHEST - 1 VIEW  COMPARISON:  CT scan and chest x-ray dated 05/09/2014  FINDINGS: There is chronic cardiomegaly. Dialysis catheter with the tip in the right atrium. Lungs are clear. Pulmonary vascularity is normal. No acute osseous abnormality.  IMPRESSION: No acute abnormality.  Chronic cardiomegaly.   Electronically Signed   By: Rozetta Nunnery M.D.   On: 06/05/2014 15:15   Dg Hip Complete Left 06/05/2014   CLINICAL DATA:  Left hip pain secondary to a fall.  EXAM: LEFT HIP - COMPLETE 2+ VIEW  COMPARISON:  None.  FINDINGS: There is an angulated overriding subcapital fracture of the left femoral neck. Osteopenia. Pelvic bones are intact.  IMPRESSION: Angulated subcapital fracture of the proximal left femur.   Electronically Signed   By: Rozetta Nunnery M.D.   On: 06/05/2014 15:13   Dg Femur Left Port 06/05/2014   CLINICAL DATA:  Status post  fall  EXAM: PORTABLE LEFT FEMUR - 2 VIEW  COMPARISON:  None.  FINDINGS: Mildly displaced and angulated left femoral neck fracture. No dislocation. No lytic or sclerotic osseous lesion. Tricompartmental osteoarthritis of the left knee.  IMPRESSION: Mildly displaced and angulated left femoral neck fracture.   Electronically Signed   By: Kathreen Devoid   On: 06/05/2014 16:24     Francine Graven, DO 06/07/14 1536

## 2014-06-05 NOTE — H&P (Signed)
[facility] History and Physical  [name]:[id number] DOB: 1946-07-18 DOA: 06/05/2014  Referring physician: Dr. Thurnell Garbe PCP: Glo Herring., MD   Chief Complaint: left hip pain  HPI: [name] is a 68 y.o. 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 He denies any prodromal symptoms, no chest pain or shortness of breath. He was supposed to be on Lovenox for DVT prophylaxis. And he is currently on aspirin.  In the ED: Hip x-rays were done as below., Chest x-ray with no acute abnormalities as below an EKG at this time is pending.   Review of Systems:  Constitutional:  No weight loss, night sweats, Fevers, chills, fatigue.  HEENT:  No headaches, Difficulty swallowing,Tooth/dental problems,Sore throat,  No sneezing, itching, ear ache, nasal congestion, post nasal drip,  Cardio-vascular:  No chest pain, Orthopnea, PND, swelling in lower extremities, anasarca, dizziness, palpitations  GI:  No heartburn, indigestion, abdominal pain, nausea, vomiting, diarrhea, change in bowel habits, loss of appetite  Resp:  No shortness of breath with exertion or at rest. No excess mucus, no productive cough, No non-productive cough, No coughing up of blood.No change in color of mucus.No wheezing.No chest wall deformity  Skin:  no rash or lesions.  GU:  no dysuria, change in color of urine, no urgency or frequency. No flank pain.  Musculoskeletal:  No joint pain or swelling. No decreased range of motion. No back pain.  Psych:  No change in mood or affect. No depression or anxiety. No  memory loss.   Past Medical History  Diagnosis Date  . Hypotension   . Amyloid heart disease   . Cardiomyopathy   . Gout   . BPH (benign prostatic hypertrophy)   . ESRD (end stage renal disease) on dialysis    Past Surgical History  Procedure Laterality Date  . Cholecystectomy    . Hernia repair    . Av fistula placement    . Nephrectomy      left  . Colonoscopy  07/19/2012    Procedure: COLONOSCOPY;  Surgeon: Rogene Houston, MD;  Location[facility];  Service: Endoscopy;  Laterality: N/A;  730   Social History:  reports that he quit smoking about 32 years ago. His smoking use included Cigarettes. He has a .5 pack-year smoking history. He does not have any smokeless tobacco history on file. He reports that he does not drink alcohol or use illicit drugs.  No Known Allergies  Family History  Problem Relation Age of Onset  . Cancer Mother   . Bone cancer Father   . Heart failure Father      Prior to Admission medications   Medication Sig Start Date End Date Taking? Authorizing Provider  acetaminophen (TYLENOL) 325 MG tablet Take 2 tablets (650 mg total) by mouth every 6 (six) hours as needed for mild pain (or Fever >/= 101). 05/15/14  Yes Kathie Dike, MD  allopurinol (ZYLOPRIM) 100 MG tablet Take 100 mg by mouth daily.   Yes Historical Provider, MD  aspirin 81 MG tablet Take 81 mg by mouth daily.   Yes Historical Provider, MD  cinacalcet [facility])  90 MG tablet Take 90 mg by mouth daily. With evening meal   Yes Historical Provider, MD  HYDROcodone-acetaminophen (NORCO/VICODIN) 5-325 MG per tablet Take 1 tablet by mouth every 4 (four) hours as needed for moderate pain.   Yes Historical Provider, MD  metoCLOPramide (REGLAN) 10 MG tablet Take 20 mg by mouth Three times a day.  06/27/12  Yes Historical Provider, MD  midodrine (PROAMATINE) 10 MG tablet Take 10 mg by mouth 3 (three) times daily.  12/03/11  Yes Historical Provider, MD  multivitamin (RENA-VIT) TABS tablet Take 1  tablet by mouth daily.   Yes Historical Provider, MD  omeprazole (PRILOSEC) 20 MG capsule Take 40 mg by mouth 2 (two) times daily before a meal.    Yes Historical Provider, MD  phenylephrine 20 mg in dextrose 5 % 250 mL Inject 30-200 mcg/min into the vein continuous. 05/15/14  Yes Kathie Dike, MD  piperacillin-tazobactam (ZOSYN) 2-0.25 GM/50ML IVPB Inject 50 mLs (2.25 g total) into the vein every 8 (eight) hours. 05/15/14  Yes Kathie Dike, MD  sevelamer (RENVELA) 800 MG tablet Take 3,200 mg by mouth 3 (three) times daily with meals. 3200 mg (4 tablets) with meals and 800 mg (1 tablet) with snacks   Yes Historical Provider, MD  Vancomycin (VANCOCIN) 750 MG/150ML SOLN Inject 150 mLs (750 mg total) into the vein every Monday, Wednesday, and Friday with hemodialysis. 05/16/14  Yes Kathie Dike, MD  Venlafaxine HCl 75 MG TB24 Take 75 mg by mouth daily.    Yes Historical Provider, MD  enoxaparin (LOVENOX) 30 MG/0.3ML injection Inject 0.3 mLs (30 mg total) into the skin daily. 05/15/14   Kathie Dike, MD  levofloxacin (LEVAQUIN) 500 MG tablet Take 500 mg by mouth every other day. For a total of 3 doses    Historical Provider, MD   Physical Exam: Filed Vitals:   06/05/14 1409 06/05/14 1410 06/05/14 1412 06/05/14 1630  BP: 75/48 100/53  82/58  Pulse: 78   80  Temp: 97.8 F (36.6 C)     TempSrc: Oral     Resp: 22   10  SpO2: 100%  98% 100%    Wt Readings from Last 3 Encounters:  05/14/14 65 kg (143 lb 4.8 oz)  02/12/14 79.379 kg (175 lb)  12/06/12 71.578 kg (157 lb 12.8 oz)    General:   and pain cachectic  Eyes: PERRL, normal lids, irises & conjunctiva ENT: grossly normal hearing, lips & tongue Neck: no LAD, masses or thyromegaly Cardiovascular: RRR, no m/r/g. No LE edema. Telemetry: SR, no arrhythmias  Respiratory:  good air movement clear to auscultation  Abdomen: soft,  distended abdomen not tender  Skin: no rash or induration seen on limited exam Musculoskeletal: grossly normal  tone BUE/BLE Neurologic: grossly non-focal.          Labs on Admission:  Basic Metabolic Panel:  Recent Labs Lab 06/05/14 1424  NA 141  K 3.8  CL 98  CO2 21  GLUCOSE 79  BUN 21  CREATININE 7.24*  CALCIUM 8.2*   Liver Function Tests: No results found for this basename: AST, ALT, ALKPHOS, BILITOT, PROT, ALBUMIN,  in the last 168 hours No results found for this basename: LIPASE, AMYLASE,  in the last 168 hours No results found for this basename: AMMONIA,  in the last 168 hours CBC:  Recent Labs Lab 06/05/14 1424  WBC 5.9  NEUTROABS 4.3  HGB 9.5*  HCT 28.4*  MCV 99.0  PLT 200   Cardiac Enzymes: No  results found for this basename: CKTOTAL, CKMB, CKMBINDEX, TROPONINI,  in the last 168 hours  BNP (last 3 results)  Recent Labs  02/12/14 1139 05/09/14 1100  PROBNP 21129.0* 32097.0*   CBG: No results found for this basename: GLUCAP,  in the last 168 hours  Radiological Exams on Admission: Dg Chest 1 View  06/05/2014   CLINICAL DATA:  Pre operative respiratory exam.  Left hip fracture.  EXAM: CHEST - 1 VIEW  COMPARISON:  CT scan and chest x-ray dated 05/09/2014  FINDINGS: There is chronic cardiomegaly. Dialysis catheter with the tip in the right atrium. Lungs are clear. Pulmonary vascularity is normal. No acute osseous abnormality.  IMPRESSION: No acute abnormality.  Chronic cardiomegaly.   Electronically Signed   By: Rozetta Nunnery M.D.   On: 06/05/2014 15:15   Dg Hip Complete Left  06/05/2014   CLINICAL DATA:  Left hip pain secondary to a fall.  EXAM: LEFT HIP - COMPLETE 2+ VIEW  COMPARISON:  None.  FINDINGS: There is an angulated overriding subcapital fracture of the left femoral neck. Osteopenia. Pelvic bones are intact.  IMPRESSION: Angulated subcapital fracture of the proximal left femur.   Electronically Signed   By: Rozetta Nunnery M.D.   On: 06/05/2014 15:13   Dg Femur Left Port  06/05/2014   CLINICAL DATA:  Status post fall  EXAM: PORTABLE LEFT FEMUR - 2 VIEW   COMPARISON:  None.  FINDINGS: Mildly displaced and angulated left femoral neck fracture. No dislocation. No lytic or sclerotic osseous lesion. Tricompartmental osteoarthritis of the left knee.  IMPRESSION: Mildly displaced and angulated left femoral neck fracture.   Electronically Signed   By: Kathreen Devoid   On: 06/05/2014 16:24    EKG: Independently reviewed.initial EKG was low-voltage fast or be repeated.   Assessment/Plan  Subcapital fracture of femur - The emergency room physician has already consulted orthopedic. Admit to telemetry. - He is moderate to high risk for any cardiopulmonary complications, he has end-stage renal disease, with restrictive cardiomyopathy 2 amyloid. - Have discussed the risk and benefits of performing surgery and the family would like to proceed as well as the patient. - He is currently not on a beta blocker, as he uses Midodrine to keep his blood pressure elevated during dialysis. - Old consult cardiology for further assistance.  ESRD (end stage renal disease) - Already consulted renal for dialysis tomorrow morning.  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 We'll admit him to telemetry and he has remained asymptomatic. He is currently on no AV nodal blocking agents. I will consult cardiology.   Code Status: full DVT Prophylaxis:lovenox Family Communication: wife Disposition Plan: inpatient  Time spent: 81 minutes  Charlynne Cousins [facility] Pager [phone]  **Disclaimer: This note may have been dictated with voice recognition software. Similar sounding words can inadvertently be transcribed and this note may contain transcription errors which may not have been corrected upon publication of note.**

## 2014-06-06 ENCOUNTER — Encounter (HOSPITAL_COMMUNITY): Payer: Self-pay | Admitting: Physician Assistant

## 2014-06-06 LAB — CBC
HEMATOCRIT: 30.2 % — AB (ref 39.0–52.0)
Hemoglobin: 9.9 g/dL — ABNORMAL LOW (ref 13.0–17.0)
MCH: 32.8 pg (ref 26.0–34.0)
MCHC: 32.8 g/dL (ref 30.0–36.0)
MCV: 100 fL (ref 78.0–100.0)
Platelets: 207 10*3/uL (ref 150–400)
RBC: 3.02 MIL/uL — ABNORMAL LOW (ref 4.22–5.81)
RDW: 15.4 % (ref 11.5–15.5)
WBC: 5.2 10*3/uL (ref 4.0–10.5)

## 2014-06-06 LAB — RENAL FUNCTION PANEL
Albumin: 3.1 g/dL — ABNORMAL LOW (ref 3.5–5.2)
Anion gap: 20 — ABNORMAL HIGH (ref 5–15)
BUN: 28 mg/dL — AB (ref 6–23)
CALCIUM: 7.7 mg/dL — AB (ref 8.4–10.5)
CO2: 22 mEq/L (ref 19–32)
Chloride: 92 mEq/L — ABNORMAL LOW (ref 96–112)
Creatinine, Ser: 9.27 mg/dL — ABNORMAL HIGH (ref 0.50–1.35)
GFR calc Af Amer: 6 mL/min — ABNORMAL LOW (ref >90)
GFR calc non Af Amer: 5 mL/min — ABNORMAL LOW (ref >90)
GLUCOSE: 98 mg/dL (ref 70–99)
Phosphorus: 3.2 mg/dL (ref 2.3–4.6)
Potassium: 4.9 mEq/L (ref 3.7–5.3)
Sodium: 134 mEq/L — ABNORMAL LOW (ref 137–147)

## 2014-06-06 MED ORDER — NEPRO/CARBSTEADY PO LIQD
237.0000 mL | ORAL | Status: DC | PRN
  Filled 2014-06-06: qty 237

## 2014-06-06 MED ORDER — SODIUM CHLORIDE 0.9 % IV SOLN: 100.0000 mL | INTRAVENOUS | Status: DC | PRN

## 2014-06-06 MED ORDER — HEPARIN SODIUM (PORCINE) 1000 UNIT/ML DIALYSIS
5000.0000 [IU] | Freq: Once | INTRAMUSCULAR | Status: DC
  Filled 2014-06-06: qty 5

## 2014-06-06 MED ORDER — HEPARIN SODIUM (PORCINE) 1000 UNIT/ML DIALYSIS
1000.0000 [IU] | INTRAMUSCULAR | Status: DC | PRN
  Filled 2014-06-06: qty 1

## 2014-06-06 MED ORDER — ALTEPLASE 2 MG IJ SOLR
2.0000 mg | Freq: Once | INTRAMUSCULAR | Status: AC | PRN
  Filled 2014-06-06: qty 2

## 2014-06-06 MED ORDER — PENTAFLUOROPROP-TETRAFLUOROETH EX AERO: 1.0000 "application " | INHALATION_SPRAY | CUTANEOUS | Status: DC | PRN

## 2014-06-06 MED ORDER — LIDOCAINE-PRILOCAINE 2.5-2.5 % EX CREA
1.0000 "application " | TOPICAL_CREAM | CUTANEOUS | Status: DC | PRN
  Filled 2014-06-06: qty 5

## 2014-06-06 MED ORDER — LIDOCAINE HCL (PF) 1 % IJ SOLN: 5.0000 mL | INTRAMUSCULAR | Status: DC | PRN

## 2014-06-06 MED ORDER — ENOXAPARIN SODIUM 30 MG/0.3ML ~~LOC~~ SOLN
30.0000 mg | SUBCUTANEOUS | Status: DC
  Filled 2014-06-06: qty 0.3

## 2014-06-06 NOTE — Consult Note (Addendum)
Renal Service Consult Note [facility]  [name] 06/06/2014 Sol Blazing Requesting Physician:  Dr Broadus John  Reason for Consult:  ESRD patient with fall and hip fracture HPI: The patient is a 68 y.o. 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  He has been getting HD in [place] and has been catheter dependent for most of his time on HD after having a failed LUA access initially.  He was admitted at [facility] in July with fevers.  Workup was negative and he improved then fevers recurred on HD and he was transferred to [facility] where he had the R IJ tunneled HD cath removed and a new L thigh catheter placed.    Pt fell onto L hip at home and xrays in ED revealed L hip fracture. Pt admitted yesterday and plans are for likely surgery tomorrow. Pt without SOB, CP. Abd tight and gets that way when fluid builds up.  He has lost body wt according to staff at his HD unit and his EDW needs to be lowered.  No edema, no n/v/d, no jt pain, no rash or fevers.     Chart review: 12/05 - admitted for heart cath for possible restrictive CM, had hx of prior nephrectomy for RCC. Outpt eval had shown ECHO w normal LV size and fxn, moderate RVH, RV pressures 30-40. CT abd with diffuse meseteric edema and bilat pleural effusion, some ascites. Cardiac MRI had been negative. Cath showed nonobstructive disease. CKD 1.7 creat. Path was negative on cardiac biopsy for amyloid or sarcoidosis, there was lymphocytosis suggestive of myocarditis.  02/06 - syncope and fall, hx of CM related to amyloidosis , acute on CRF 05/06 - hx of amyloid CM f/b [facility] w AMS, hypotension and fever. Urosepsis Ecoli. Was in shock on pressors, acute on CKD creat 2.5 > 5, rx'd w dobutamine. Transferred to [facility].  07/15 - admitted [facility] for fevers and rigors during dialysis, CP. Hx staph bacteremia in April '15 rx'd w IV abx.  ESRD on  HD, cardiac amyloid, chronic hypotension on midodrine. BCx s were negative, WBC normal. No clear source of hypotension and fevers. Ascitic fluid sent and neg for cx but Ab continued for question of SBP.  ECHO showed large calcified semimobile mass in right atrium. This was reviewed by cardiology and felt to be remnant of old, fibrosed HD catheter tip. Then he had 103 temp on HD and dropped BP again. Sent to [facility] from [facility] for cath removal and ID services.    Past Medical History  Past Medical History  Diagnosis Date  . Hypotension   . Amyloid heart disease   . Cardiomyopathy   . Gout   . BPH (benign prostatic hypertrophy)   . ESRD (end stage renal disease) on dialysis    Past Surgical History  Past Surgical History  Procedure Laterality Date  . Cholecystectomy    . Hernia repair    . Av fistula placement    . Nephrectomy      left  . Colonoscopy  07/19/2012    Procedure: COLONOSCOPY;  Surgeon: Rogene Houston, MD;  Location: [facility];  Service: Endoscopy;  Laterality: N/A;  72   Family History  Family History  Problem Relation Age of Onset  . Cancer Mother   . Bone cancer Father   . Heart failure Father    Social History  reports that he  quit smoking about 32 years ago. His smoking use included Cigarettes. He has a .5 pack-year smoking history. He does not have any smokeless tobacco history on file. He reports that he does not drink alcohol or use illicit drugs. Allergies No Known Allergies Home medications Prior to Admission medications   Medication Sig Start Date End Date Taking? Authorizing Provider  acetaminophen (TYLENOL) 325 MG tablet Take 2 tablets (650 mg total) by mouth every 6 (six) hours as needed for mild pain (or Fever >/= 101). 05/15/14  Yes Kathie Dike, MD  allopurinol (ZYLOPRIM) 100 MG tablet Take 100 mg by mouth daily.   Yes Historical Provider, MD  aspirin 81 MG tablet Take 81 mg by mouth daily.   Yes Historical Provider, MD  cinacalcet (SENSIPAR)  90 MG tablet Take 90 mg by mouth daily. With evening meal   Yes Historical Provider, MD  HYDROcodone-acetaminophen (NORCO/VICODIN) 5-325 MG per tablet Take 1 tablet by mouth every 4 (four) hours as needed for moderate pain.   Yes Historical Provider, MD  metoCLOPramide (REGLAN) 10 MG tablet Take 20 mg by mouth Three times a day.  06/27/12  Yes Historical Provider, MD  midodrine (PROAMATINE) 10 MG tablet Take 10 mg by mouth 3 (three) times daily.  12/03/11  Yes Historical Provider, MD  multivitamin (RENA-VIT) TABS tablet Take 1 tablet by mouth daily.   Yes Historical Provider, MD  omeprazole (PRILOSEC) 20 MG capsule Take 40 mg by mouth 2 (two) times daily before a meal.    Yes Historical Provider, MD  phenylephrine 20 mg in dextrose 5 % 250 mL Inject 30-200 mcg/min into the vein continuous. 05/15/14  Yes Kathie Dike, MD  piperacillin-tazobactam (ZOSYN) 2-0.25 GM/50ML IVPB Inject 50 mLs (2.25 g total) into the vein every 8 (eight) hours. 05/15/14  Yes Kathie Dike, MD  sevelamer (RENVELA) 800 MG tablet Take 3,200 mg by mouth 3 (three) times daily with meals. 3200 mg (4 tablets) with meals and 800 mg (1 tablet) with snacks   Yes Historical Provider, MD  Vancomycin (VANCOCIN) 750 MG/150ML SOLN Inject 150 mLs (750 mg total) into the vein every Monday, Wednesday, and Friday with hemodialysis. 05/16/14  Yes Kathie Dike, MD  Venlafaxine HCl 75 MG TB24 Take 75 mg by mouth daily.    Yes Historical Provider, MD  enoxaparin (LOVENOX) 30 MG/0.3ML injection Inject 0.3 mLs (30 mg total) into the skin daily. 05/15/14   Kathie Dike, MD  levofloxacin (LEVAQUIN) 500 MG tablet Take 500 mg by mouth every other day. For a total of 3 doses    Historical Provider, MD   Liver Function Tests No results found for this basename: AST, ALT, ALKPHOS, BILITOT, PROT, ALBUMIN,  in the last 168 hours No results found for this basename: LIPASE, AMYLASE,  in the last 168 hours CBC  Recent Labs Lab 06/05/14 1424 06/06/14 0526   WBC 5.9 5.2  NEUTROABS 4.3  --   HGB 9.5* 9.9*  HCT 28.4* 30.2*  MCV 99.0 100.0  PLT 200 627   Basic Metabolic Panel  Recent Labs Lab 06/05/14 1424  NA 141  K 3.8  CL 98  CO2 21  GLUCOSE 79  BUN 21  CREATININE 7.24*  CALCIUM 8.2*    Filed Vitals:   06/05/14 1830 06/05/14 2100 06/06/14 0200 06/06/14 0545  BP: 80/59 85/52 82/59  86/50  Pulse: 82 101 80 78  Temp:  98.4 F (36.9 C) 98.3 F (36.8 C) 98 F (36.7 C)  TempSrc:  Oral Oral Oral  Resp: 14 18 20 20   Height:  5\' 10"  (1.778 m)    Weight:  69.7 kg (153 lb 10.6 oz)    SpO2: 100% 96% 96% 95%   Exam Alert, no distress No rash, cyanosis or gangrene Sclera anicteric, throat clear + jvd Chest is clear bilat RRR no MRG Abd tense, nontender, liver down 6 cm, possibly some ascites No LE or UE edema L groin tunneled HD cath  R chest old cath site with SQ density, no erythema or edema or drainage Neuro is nf, ox 3, no asterixis, calm and pleasant  HD: MWF [facility] 4h  350/600   2K/2.5 Ca [place]   71kg (dry wt is old per HD staff RN, prob needs to be lowered, has lost wt since admit at [facility])  Heparin 2000 then 1500 u/hour   Max UF 1.5L/ hr   BP runs 72'Q- 20'U systolic on HD routinely EPO 4000 TIW, Hectorol 1ug TIW      Assessment: 1 Fall / left hip fracture- for surgery tomorrow 2 ESRD on HD - cath dependent for years, now in L groin after R chest cath removed in July for refractory febrile illness 3 Cardiac amyloidosis- has normal LV but RV failure, chronic hypotension on midodrine. Not on any current Rx for amyloid 4 Anemia cont ESA 5 HPTH cont renvela, sensipar and vit D 6 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, baseline SBP's in 60's - 80's range   Plan- HD today, max UF keep SBP over 65, lower dry wt while here for hip fx  Kelly Splinter MD (pgr) [phone]    ([phone] 06/06/2014, 8:47 AM

## 2014-06-06 NOTE — Progress Notes (Signed)
Utilization review completed.  

## 2014-06-06 NOTE — Progress Notes (Signed)
[facility] PROGRESS NOTE  [name] QPR:[id number] DOB: [birth year] DOA: 06/05/2014 PCP: Glo Herring., MD  Assessment/Plan: 1. L Hip fracture -for OR tomorrow per Dr.Blackman, HD catheter in same side -will use lovenox for DVT proph post op if ok with ORtho -PT, will need [facility]  2. Cardiac amyloidosis -followed closely by Cards at [facility] -stable, h/o RV failure -continue midodrine  3. ESRD on HD  -HD cath in L groin, limited access options -HD per Renal  4. Chronic hypotension -BP runs in 70-80s range per wife -continue midodrine  5. Anemia of chronic disease -stable  DVt proph: lovenox x1 now then hold then resume post op  Code Status: full Code Family Communication: d/w family at bedside Disposition Plan: [facility]   Consultants:  Renal   Cards  Ortho  HPI/Subjective: Feels ok, no dyspnea/chest pain  Objective: Filed Vitals:   06/06/14 1131  BP: 100/61  Pulse: 81  Temp:   Resp:    No intake or output data in the 24 hours ending 06/06/14 1239 Filed Weights   06/05/14 2100  Weight: 69.7 kg (153 lb 10.6 oz)    Exam:   General:  AAOx3, no distress  Cardiovascular: S1S2/RRR  Respiratory: CTAB  Abdomen: soft, mildly distended  Musculoskeletal: no edema c/c, HD catheter in L groin, did not mobilize L hip   Data Reviewed: Basic Metabolic Panel:  Recent Labs Lab 06/05/14 1424  NA 141  K 3.8  CL 98  CO2 21  GLUCOSE 79  BUN 21  CREATININE 7.24*  CALCIUM 8.2*   Liver Function Tests: No results found for this basename: AST, ALT, ALKPHOS, BILITOT, PROT, ALBUMIN,  in the last 168 hours No results found for this basename: LIPASE, AMYLASE,  in the last 168 hours No results found for this basename: AMMONIA,  in the last 168 hours CBC:  Recent Labs Lab 06/05/14 1424 06/06/14 0526  WBC 5.9 5.2  NEUTROABS 4.3  --   HGB 9.5* 9.9*  HCT 28.4* 30.2*  MCV 99.0 100.0  PLT 200 207   Cardiac Enzymes: No results found for this  basename: CKTOTAL, CKMB, CKMBINDEX, TROPONINI,  in the last 168 hours BNP (last 3 results)  Recent Labs  02/12/14 1139 05/09/14 1100  PROBNP 21129.0* 32097.0*   CBG: No results found for this basename: GLUCAP,  in the last 168 hours  No results found for this or any previous visit (from the past 240 hour(s)).   Studies: Dg Chest 1 View  06/05/2014   CLINICAL DATA:  Pre operative respiratory exam.  Left hip fracture.  EXAM: CHEST - 1 VIEW  COMPARISON:  CT scan and chest x-ray dated 05/09/2014  FINDINGS: There is chronic cardiomegaly. Dialysis catheter with the tip in the right atrium. Lungs are clear. Pulmonary vascularity is normal. No acute osseous abnormality.  IMPRESSION: No acute abnormality.  Chronic cardiomegaly.   Electronically Signed   By: Rozetta Nunnery M.D.   On: 06/05/2014 15:15   Dg Hip Complete Left  06/05/2014   CLINICAL DATA:  Left hip pain secondary to a fall.  EXAM: LEFT HIP - COMPLETE 2+ VIEW  COMPARISON:  None.  FINDINGS: There is an angulated overriding subcapital fracture of the left femoral neck. Osteopenia. Pelvic bones are intact.  IMPRESSION: Angulated subcapital fracture of the proximal left femur.   Electronically Signed   By: Rozetta Nunnery M.D.   On: 06/05/2014 15:13   Dg Femur Left Port  06/05/2014   CLINICAL DATA:  Status post fall  EXAM: PORTABLE LEFT FEMUR - 2 VIEW  COMPARISON:  None.  FINDINGS: Mildly displaced and angulated left femoral neck fracture. No dislocation. No lytic or sclerotic osseous lesion. Tricompartmental osteoarthritis of the left knee.  IMPRESSION: Mildly displaced and angulated left femoral neck fracture.   Electronically Signed   By: Kathreen Devoid   On: 06/05/2014 16:24    Scheduled Meds: . allopurinol  100 mg Oral Daily  . aspirin  81 mg Oral Daily  . cinacalcet  90 mg Oral Q supper  . enoxaparin (LOVENOX) injection  30 mg Subcutaneous Q24H  . metoCLOPramide  20 mg Oral TID AC  . midodrine  10 mg Oral TID WC  . sevelamer carbonate   3,200 mg Oral TID WC  . sodium chloride  3 mL Intravenous Q12H  . sodium chloride  3 mL Intravenous Q12H  . venlafaxine XR  75 mg Oral Q breakfast   Continuous Infusions:  Antibiotics Given (last 72 hours)   None      Principal Problem:   Subcapital fracture of femur Active Problems:   ESRD (end stage renal disease)   Amyloid heart disease   Chronic diastolic congestive heart failure, NYHA class 2   Closed left hip fracture    Time spent:55min    [facility]  [facility] Pager [phone]. If 7PM-7AM, please contact night-coverage at [email or web address], password [facility] 06/06/2014, 12:39 PM  LOS: 1 day

## 2014-06-06 NOTE — Progress Notes (Signed)
Patient ID: [name], male   DOB: 06-03-1946, 68 y.o.   MRN: [id number] I have met [name] and his family.  He and they understand his situation and that he will need a left hip hemiarthroplasty (partial hip replacement).  Today is his regular dialysis day and I feel that he should get dialysis today and we will plan on surgery for tomorrow.  He can eat today from an Ortho standpoint.  The limiting factor is his dialysis catheter is on his injured left side.  For surgery, he will need to be on his right side with his left hip up and we will be manipulating the left hip in order to get a partial hip replacement in correctly.  Worse-case scenario would be to leave the catheter in and to prep it into the field if this catheter is his only option for dialysis access and is pliable (easily bendable)  To allow for flexion of his hip during surgery.

## 2014-06-06 NOTE — Progress Notes (Signed)
Subjective: No CP, dizziness or SOB  Objective: Vital signs in last 24 hours: Temp:  [97.8 F (36.6 C)-98.4 F (36.9 C)] 98 F (36.7 C) (08/12 0545) Pulse Rate:  [78-101] 81 (08/12 1131) Resp:  [10-22] 20 (08/12 0545) BP: (75-100)/(48-62) 100/61 mmHg (08/12 1131) SpO2:  [95 %-100 %] 95 % (08/12 0545) Weight:  [153 lb 10.6 oz (69.7 kg)] 153 lb 10.6 oz (69.7 kg) (08/11 2100) Last BM Date: 06/05/14  Intake/Output from previous day:   Intake/Output this shift:    Medications Current Facility-Administered Medications  Medication Dose Route Frequency Provider Last Rate Last Dose  . 0.9 %  sodium chloride infusion  250 mL Intravenous PRN Charlynne Cousins, MD      . acetaminophen (TYLENOL) tablet 650 mg  650 mg Oral Q6H PRN Charlynne Cousins, MD      . allopurinol (ZYLOPRIM) tablet 100 mg  100 mg Oral Daily Charlynne Cousins, MD   100 mg at 06/06/14 1133  . aspirin chewable tablet 81 mg  81 mg Oral Daily Charlynne Cousins, MD   81 mg at 06/06/14 1133  . cinacalcet (SENSIPAR) tablet 90 mg  90 mg Oral Q supper Charlynne Cousins, MD   90 mg at 06/06/14 0010  . enoxaparin (LOVENOX) injection 30 mg  30 mg Subcutaneous Q24H Domenic Polite, MD      . HYDROcodone-acetaminophen (NORCO/VICODIN) 5-325 MG per tablet 1 tablet  1 tablet Oral Q4H PRN Charlynne Cousins, MD   1 tablet at 06/06/14 1028  . HYDROmorphone (DILAUDID) injection 1 mg  1 mg Intravenous Q2H PRN Charlynne Cousins, MD   1 mg at 06/05/14 1809  . metoCLOPramide (REGLAN) tablet 20 mg  20 mg Oral TID AC Charlynne Cousins, MD   20 mg at 06/06/14 1240  . midodrine (PROAMATINE) tablet 10 mg  10 mg Oral TID WC Charlynne Cousins, MD   10 mg at 06/06/14 1240  . ondansetron (ZOFRAN) tablet 4 mg  4 mg Oral Q6H PRN Charlynne Cousins, MD       Or  . ondansetron [facility]) injection 4 mg  4 mg Intravenous Q6H PRN Charlynne Cousins, MD      . polyethylene glycol [facility] Floria Raveling) packet 17 g  17 g Oral Daily PRN  Charlynne Cousins, MD      . sevelamer carbonate (RENVELA) tablet 3,200 mg  3,200 mg Oral TID WC Charlynne Cousins, MD   3,200 mg at 06/06/14 1240  . sevelamer carbonate (RENVELA) tablet 800 mg  800 mg Oral Daily PRN Charlynne Cousins, MD      . sodium chloride 0.9 % injection 3 mL  3 mL Intravenous Q12H Charlynne Cousins, MD   3 mL at 06/06/14 1213  . sodium chloride 0.9 % injection 3 mL  3 mL Intravenous Q12H Charlynne Cousins, MD      . sodium chloride 0.9 % injection 3 mL  3 mL Intravenous PRN Charlynne Cousins, MD      . venlafaxine XR (EFFEXOR-XR) 24 hr capsule 75 mg  75 mg Oral Q breakfast Charlynne Cousins, MD        PE: General appearance: alert, cooperative and no distress Lungs: clear to auscultation bilaterally Heart: irregularly irregular rhythm and No MM Extremities: No LEE Pulses: 1+ radials and PTs Skin: Warm and dry Neurologic: Grossly normal  Lab Results:   Recent Labs  06/05/14 1424 06/06/14 0526  WBC 5.9 5.2  HGB 9.5* 9.9*  HCT 28.4* 30.2*  PLT 200 207   BMET  Recent Labs  06/05/14 1424  NA 141  K 3.8  CL 98  CO2 21  GLUCOSE 79  BUN 21  CREATININE 7.24*  CALCIUM 8.2*   PT/INR  Recent Labs  06/05/14 1424  LABPROT 16.1*  INR 1.29    Assessment/Plan  Principal Problem:   Subcapital fracture of femur Active Problems:   ESRD (end stage renal disease)   Amyloid heart disease   Chronic diastolic congestive heart failure, NYHA class 2   Closed left hip fracture   Atrial Fib   Right IJ thrombus   Plan:   After reviewing telemetry he has had a few 3-5 beat runs for NSVT.  He is currently not on a beta blocker nor will his BP tolerate adding one.  Overall, his HR is controlled.  Still in Afib.  CHADSVASc of 3.  Anticoagulation candidate?  Apparently this was going to be discussed with PCP regarding RIJ thrombus.  Hip surgery tomorrow.        LOS: 1 day    HAGER, BRYAN PA-C 06/06/2014 12:59 PM  Attending Note:   The  patient was seen and examined.  Agree with assessment and plan as noted above.  Changes made to the above note as needed.  No cardiac complaints. Breathing comfortably.  For hip surgery tomorrow   Thayer Headings, Brooke Bonito., MD, [facility] 06/06/2014, 1:32 PM [address]  [place] Pager [phone]

## 2014-06-07 ENCOUNTER — Encounter (HOSPITAL_COMMUNITY): Payer: Self-pay | Admitting: General Practice

## 2014-06-07 ENCOUNTER — Inpatient Hospital Stay (HOSPITAL_COMMUNITY): Payer: Medicare Other

## 2014-06-07 ENCOUNTER — Encounter (HOSPITAL_COMMUNITY): Payer: Medicare Other | Admitting: Certified Registered Nurse Anesthetist

## 2014-06-07 ENCOUNTER — Inpatient Hospital Stay (HOSPITAL_COMMUNITY): Payer: Medicare Other | Admitting: Certified Registered Nurse Anesthetist

## 2014-06-07 ENCOUNTER — Encounter (HOSPITAL_COMMUNITY)
Admission: EM | Disposition: A | Discharge status: Skilled Nursing Facility | Payer: Self-pay | Source: Home / Self Care | Attending: Internal Medicine

## 2014-06-07 HISTORY — PX: HIP ARTHROPLASTY: SHX981

## 2014-06-07 LAB — BASIC METABOLIC PANEL
Anion gap: 16 — ABNORMAL HIGH (ref 5–15)
BUN: 19 mg/dL (ref 6–23)
CHLORIDE: 97 meq/L (ref 96–112)
CO2: 24 mEq/L (ref 19–32)
CREATININE: 7.35 mg/dL — AB (ref 0.50–1.35)
Calcium: 7.9 mg/dL — ABNORMAL LOW (ref 8.4–10.5)
GFR calc Af Amer: 8 mL/min — ABNORMAL LOW (ref >90)
GFR calc non Af Amer: 7 mL/min — ABNORMAL LOW (ref >90)
Glucose, Bld: 92 mg/dL (ref 70–99)
Potassium: 4.9 mEq/L (ref 3.7–5.3)
Sodium: 137 mEq/L (ref 137–147)

## 2014-06-07 LAB — SURGICAL PCR SCREEN
MRSA, PCR: NEGATIVE
Staphylococcus aureus: NEGATIVE

## 2014-06-07 LAB — CBC
HCT: 30.5 % — ABNORMAL LOW (ref 39.0–52.0)
HEMOGLOBIN: 10 g/dL — AB (ref 13.0–17.0)
MCH: 31.7 pg (ref 26.0–34.0)
MCHC: 32.8 g/dL (ref 30.0–36.0)
MCV: 96.8 fL (ref 78.0–100.0)
Platelets: 195 10*3/uL (ref 150–400)
RBC: 3.15 MIL/uL — ABNORMAL LOW (ref 4.22–5.81)
RDW: 15.2 % (ref 11.5–15.5)
WBC: 7.4 10*3/uL (ref 4.0–10.5)

## 2014-06-07 LAB — HEPATITIS B SURFACE ANTIGEN: Hepatitis B Surface Ag: NEGATIVE

## 2014-06-07 SURGERY — HEMIARTHROPLASTY, HIP, DIRECT ANTERIOR APPROACH, FOR FRACTURE
Anesthesia: General | Site: Hip | Laterality: Left

## 2014-06-07 MED ORDER — ACETAMINOPHEN 650 MG RE SUPP: 650.0000 mg | Freq: Four times a day (QID) | RECTAL | Status: DC | PRN

## 2014-06-07 MED ORDER — ROCURONIUM BROMIDE 50 MG/5ML IV SOLN
INTRAVENOUS | Status: AC
  Filled 2014-06-07: qty 1

## 2014-06-07 MED ORDER — LIDOCAINE HCL (CARDIAC) 20 MG/ML IV SOLN
INTRAVENOUS | Status: DC | PRN
  Administered 2014-06-07: 20 mg via INTRAVENOUS

## 2014-06-07 MED ORDER — FENTANYL CITRATE 0.05 MG/ML IJ SOLN
25.0000 ug | INTRAMUSCULAR | Status: DC | PRN
Start: 2014-06-07 — End: 2014-06-07

## 2014-06-07 MED ORDER — SODIUM CHLORIDE 0.9 % IR SOLN
Status: DC | PRN
  Administered 2014-06-07: 1000 mL

## 2014-06-07 MED ORDER — OXYCODONE HCL 5 MG/5ML PO SOLN: 5.0000 mg | Freq: Once | ORAL | Status: DC | PRN

## 2014-06-07 MED ORDER — NEOSTIGMINE METHYLSULFATE 10 MG/10ML IV SOLN
INTRAVENOUS | Status: AC
  Filled 2014-06-07: qty 1

## 2014-06-07 MED ORDER — MORPHINE SULFATE 2 MG/ML IJ SOLN: 0.5000 mg | INTRAMUSCULAR | Status: DC | PRN

## 2014-06-07 MED ORDER — NEOSTIGMINE METHYLSULFATE 10 MG/10ML IV SOLN
INTRAVENOUS | Status: DC | PRN
  Administered 2014-06-07: 3 mg via INTRAVENOUS

## 2014-06-07 MED ORDER — ROCURONIUM BROMIDE 100 MG/10ML IV SOLN
INTRAVENOUS | Status: DC | PRN
  Administered 2014-06-07 (×2): 25 mg via INTRAVENOUS

## 2014-06-07 MED ORDER — CEFAZOLIN SODIUM-DEXTROSE 2-3 GM-% IV SOLR
INTRAVENOUS | Status: DC | PRN
  Administered 2014-06-07: 2 g via INTRAVENOUS

## 2014-06-07 MED ORDER — LIDOCAINE HCL (CARDIAC) 20 MG/ML IV SOLN
INTRAVENOUS | Status: AC
  Filled 2014-06-07: qty 5

## 2014-06-07 MED ORDER — ONDANSETRON HCL 4 MG/2ML IJ SOLN
4.0000 mg | Freq: Four times a day (QID) | INTRAMUSCULAR | Status: DC | PRN
Start: 2014-06-07 — End: 2014-06-11

## 2014-06-07 MED ORDER — DOCUSATE SODIUM 100 MG PO CAPS
100.0000 mg | ORAL_CAPSULE | Freq: Two times a day (BID) | ORAL | Status: DC
  Administered 2014-06-07 – 2014-06-09 (×4): 100 mg via ORAL
  Filled 2014-06-07 (×4): qty 1

## 2014-06-07 MED ORDER — PHENOL 1.4 % MT LIQD
1.0000 | OROMUCOSAL | Status: DC | PRN
Start: 2014-06-07 — End: 2014-06-11

## 2014-06-07 MED ORDER — HYDROCODONE-ACETAMINOPHEN 5-325 MG PO TABS
1.0000 | ORAL_TABLET | Freq: Four times a day (QID) | ORAL | Status: DC | PRN
  Administered 2014-06-07 – 2014-06-11 (×8): 2 via ORAL
  Filled 2014-06-07 (×8): qty 2

## 2014-06-07 MED ORDER — DARBEPOETIN ALFA-POLYSORBATE 25 MCG/0.42ML IJ SOLN
25.0000 ug | INTRAMUSCULAR | Status: DC
  Administered 2014-06-08: 25 ug via SUBCUTANEOUS
  Filled 2014-06-07: qty 0.42

## 2014-06-07 MED ORDER — ONDANSETRON HCL 4 MG/2ML IJ SOLN
INTRAMUSCULAR | Status: AC
  Filled 2014-06-07: qty 2

## 2014-06-07 MED ORDER — OXYCODONE HCL 5 MG PO TABS: 5.0000 mg | ORAL_TABLET | Freq: Once | ORAL | Status: DC | PRN

## 2014-06-07 MED ORDER — MENTHOL 3 MG MT LOZG
1.0000 | LOZENGE | OROMUCOSAL | Status: DC | PRN
Start: 2014-06-07 — End: 2014-06-11

## 2014-06-07 MED ORDER — FENTANYL CITRATE 0.05 MG/ML IJ SOLN
INTRAMUSCULAR | Status: AC
  Filled 2014-06-07: qty 5

## 2014-06-07 MED ORDER — ALBUMIN HUMAN 5 % IV SOLN
INTRAVENOUS | Status: DC | PRN
  Administered 2014-06-07: 18:00:00 via INTRAVENOUS

## 2014-06-07 MED ORDER — SODIUM CHLORIDE 0.9 % IV SOLN
INTRAVENOUS | Status: DC
  Administered 2014-06-07: 21:00:00 via INTRAVENOUS

## 2014-06-07 MED ORDER — METOCLOPRAMIDE HCL 5 MG/ML IJ SOLN
5.0000 mg | Freq: Three times a day (TID) | INTRAMUSCULAR | Status: DC | PRN
Start: 2014-06-07 — End: 2014-06-09

## 2014-06-07 MED ORDER — ARTIFICIAL TEARS OP OINT
TOPICAL_OINTMENT | OPHTHALMIC | Status: AC
  Filled 2014-06-07: qty 3.5

## 2014-06-07 MED ORDER — GLYCOPYRROLATE 0.2 MG/ML IJ SOLN
INTRAMUSCULAR | Status: AC
  Filled 2014-06-07: qty 2

## 2014-06-07 MED ORDER — GLYCOPYRROLATE 0.2 MG/ML IJ SOLN
INTRAMUSCULAR | Status: DC | PRN
  Administered 2014-06-07: 0.4 mg via INTRAVENOUS

## 2014-06-07 MED ORDER — ENOXAPARIN SODIUM 30 MG/0.3ML ~~LOC~~ SOLN
30.0000 mg | SUBCUTANEOUS | Status: DC
  Administered 2014-06-08 – 2014-06-11 (×4): 30 mg via SUBCUTANEOUS
  Filled 2014-06-07 (×6): qty 0.3

## 2014-06-07 MED ORDER — PHENYLEPHRINE HCL 10 MG/ML IJ SOLN
20.0000 mg | INTRAVENOUS | Status: DC | PRN
  Administered 2014-06-07: 50 ug/min via INTRAVENOUS

## 2014-06-07 MED ORDER — METHOCARBAMOL 1000 MG/10ML IJ SOLN
500.0000 mg | Freq: Four times a day (QID) | INTRAVENOUS | Status: DC | PRN
  Filled 2014-06-07: qty 5

## 2014-06-07 MED ORDER — METHOCARBAMOL 500 MG PO TABS
500.0000 mg | ORAL_TABLET | Freq: Four times a day (QID) | ORAL | Status: DC | PRN
  Filled 2014-06-07: qty 1

## 2014-06-07 MED ORDER — ONDANSETRON HCL 4 MG/2ML IJ SOLN
INTRAMUSCULAR | Status: DC | PRN
  Administered 2014-06-07: 4 mg via INTRAVENOUS

## 2014-06-07 MED ORDER — ONDANSETRON HCL 4 MG/2ML IJ SOLN
INTRAMUSCULAR | Status: AC
Start: 2014-06-07 — End: 2014-06-07
  Filled 2014-06-07: qty 2

## 2014-06-07 MED ORDER — PHENYLEPHRINE 40 MCG/ML (10ML) SYRINGE FOR IV PUSH (FOR BLOOD PRESSURE SUPPORT)
PREFILLED_SYRINGE | INTRAVENOUS | Status: AC
  Filled 2014-06-07: qty 10

## 2014-06-07 MED ORDER — ONDANSETRON HCL 4 MG PO TABS: 4.0000 mg | ORAL_TABLET | Freq: Four times a day (QID) | ORAL | Status: DC | PRN

## 2014-06-07 MED ORDER — ACETAMINOPHEN 325 MG PO TABS
650.0000 mg | ORAL_TABLET | Freq: Four times a day (QID) | ORAL | Status: DC | PRN
Start: 2014-06-07 — End: 2014-06-11
  Administered 2014-06-09 – 2014-06-10 (×2): 650 mg via ORAL
  Filled 2014-06-07 (×3): qty 2

## 2014-06-07 MED ORDER — ETOMIDATE 2 MG/ML IV SOLN
INTRAVENOUS | Status: DC | PRN
  Administered 2014-06-07: 12 mg via INTRAVENOUS

## 2014-06-07 MED ORDER — PROPOFOL 10 MG/ML IV BOLUS
INTRAVENOUS | Status: AC
  Filled 2014-06-07: qty 20

## 2014-06-07 MED ORDER — METOCLOPRAMIDE HCL 10 MG PO TABS: 5.0000 mg | ORAL_TABLET | Freq: Three times a day (TID) | ORAL | Status: DC | PRN

## 2014-06-07 MED ORDER — MIDAZOLAM HCL 2 MG/2ML IJ SOLN
INTRAMUSCULAR | Status: AC
  Filled 2014-06-07: qty 2

## 2014-06-07 MED ORDER — DEXAMETHASONE SODIUM PHOSPHATE 4 MG/ML IJ SOLN
INTRAMUSCULAR | Status: DC | PRN
  Administered 2014-06-07: 4 mg via INTRAVENOUS

## 2014-06-07 MED ORDER — DOXERCALCIFEROL 4 MCG/2ML IV SOLN
1.0000 ug | INTRAVENOUS | Status: DC
  Administered 2014-06-08 – 2014-06-11 (×2): 1 ug via INTRAVENOUS
  Filled 2014-06-07 (×2): qty 2

## 2014-06-07 MED ORDER — ONDANSETRON HCL 4 MG/2ML IJ SOLN: 4.0000 mg | Freq: Once | INTRAMUSCULAR | Status: DC | PRN

## 2014-06-07 MED ORDER — CEFAZOLIN SODIUM-DEXTROSE 2-3 GM-% IV SOLR
2.0000 g | Freq: Once | INTRAVENOUS | Status: AC
  Administered 2014-06-07: 2 g via INTRAVENOUS
  Filled 2014-06-07: qty 50

## 2014-06-07 MED ORDER — FENTANYL CITRATE 0.05 MG/ML IJ SOLN
INTRAMUSCULAR | Status: DC | PRN
  Administered 2014-06-07: 100 ug via INTRAVENOUS
  Administered 2014-06-07 (×2): 50 ug via INTRAVENOUS

## 2014-06-07 MED ORDER — LIDOCAINE HCL (CARDIAC) 20 MG/ML IV SOLN
INTRAVENOUS | Status: AC
  Filled 2014-06-07: qty 15

## 2014-06-07 SURGICAL SUPPLY — 40 items
BLADE SAGITTAL (BLADE) ×3
BLADE SAW THK.89X75X18XSGTL (BLADE) ×1 IMPLANT
CAPT HIP FX BIPOLAR/UNIPOLAR ×3 IMPLANT
COVER SURGICAL LIGHT HANDLE (MISCELLANEOUS) ×3 IMPLANT
DRAPE HIP W/POCKET STRL (DRAPE) ×3 IMPLANT
DRAPE INCISE IOBAN 85X60 (DRAPES) ×6 IMPLANT
DRAPE U-SHAPE 47X51 STRL (DRAPES) ×3 IMPLANT
DRSG AQUACEL AG ADV 3.5X14 (GAUZE/BANDAGES/DRESSINGS) ×3 IMPLANT
DRSG MEPILEX BORDER 4X8 (GAUZE/BANDAGES/DRESSINGS) ×3 IMPLANT
DURAPREP 26ML APPLICATOR (WOUND CARE) ×3 IMPLANT
ELECT BLADE 6.5 EXT (BLADE) IMPLANT
ELECT CAUTERY BLADE 6.4 (BLADE) ×3 IMPLANT
ELECT REM PT RETURN 9FT ADLT (ELECTROSURGICAL) ×3
ELECTRODE REM PT RTRN 9FT ADLT (ELECTROSURGICAL) ×1 IMPLANT
EVACUATOR 1/8 PVC DRAIN (DRAIN) IMPLANT
GAUZE XEROFORM 5X9 LF (GAUZE/BANDAGES/DRESSINGS) ×3 IMPLANT
GLOVE BIOGEL PI IND STRL 7.5 (GLOVE) ×1 IMPLANT
GLOVE BIOGEL PI IND STRL 8 (GLOVE) ×1 IMPLANT
GLOVE BIOGEL PI INDICATOR 7.5 (GLOVE) ×2
GLOVE BIOGEL PI INDICATOR 8 (GLOVE) ×2
GLOVE ORTHO TXT STRL SZ7.5 (GLOVE) ×6 IMPLANT
GOWN STRL REUS W/ TWL LRG LVL3 (GOWN DISPOSABLE) ×2 IMPLANT
GOWN STRL REUS W/ TWL XL LVL3 (GOWN DISPOSABLE) ×4 IMPLANT
GOWN STRL REUS W/TWL LRG LVL3 (GOWN DISPOSABLE) ×6
GOWN STRL REUS W/TWL XL LVL3 (GOWN DISPOSABLE) ×12
KIT BASIN OR (CUSTOM PROCEDURE TRAY) ×3 IMPLANT
KIT ROOM TURNOVER OR (KITS) ×3 IMPLANT
MANIFOLD NEPTUNE II (INSTRUMENTS) ×3 IMPLANT
NS IRRIG 1000ML POUR BTL (IV SOLUTION) ×3 IMPLANT
PACK TOTAL JOINT (CUSTOM PROCEDURE TRAY) ×3 IMPLANT
PAD ARMBOARD 7.5X6 YLW CONV (MISCELLANEOUS) ×6 IMPLANT
STAPLER VISISTAT 35W (STAPLE) ×3 IMPLANT
SUT ETHIBOND NAB CT1 #1 30IN (SUTURE) ×6 IMPLANT
SUT VIC AB 1 CTB1 27 (SUTURE) ×6 IMPLANT
SUT VIC AB 2-0 CT1 27 (SUTURE) ×6
SUT VIC AB 2-0 CT1 TAPERPNT 27 (SUTURE) ×2 IMPLANT
TOWEL OR 17X24 6PK STRL BLUE (TOWEL DISPOSABLE) ×3 IMPLANT
TOWEL OR 17X26 10 PK STRL BLUE (TOWEL DISPOSABLE) ×3 IMPLANT
TRAY FOLEY CATH 16FRSI W/METER (SET/KITS/TRAYS/PACK) IMPLANT
WATER STERILE IRR 1000ML POUR (IV SOLUTION) ×9 IMPLANT

## 2014-06-07 NOTE — Progress Notes (Signed)
Patient ID: [name], male   DOB: [birth year], 68 y.o.   MRN: [id number] I have spoken to the patient in length as well as his family.  They understand fully that we will proceed to the OR late this afternoon for a left hip hemiarthroplasty and that we will keep his left groin dialysis catheter access in place for the surgery.

## 2014-06-07 NOTE — Progress Notes (Signed)
  [facility] Progress Note   Subjective: Awaiting surgery today, no complaints, 4kg off w HD yesterday and BP was in 70's- 80's w/o symptoms during the session  Filed Vitals:   06/06/14 2225 06/07/14 0017 06/07/14 0512 06/07/14 1031  BP: 98/32 98/62 87/37  79/23  Pulse: 87 87 83 84  Temp:  98.2 F (36.8 C) 98 F (36.7 C) 99.5 F (37.5 C)  TempSrc:      Resp: 20 20 20 18   Height:      Weight:      SpO2:  98% 96% 100%   Exam: Alert, no distress + jvd  Chest is clear bilat  RRR no MRG  Abd less tense today, soft, mildly distended, nontender, liver down 4 cm No LE or UE edema  L groin tunneled HD cath w clean exit site  Neuro is nf, ox 3  HD: MWF [facility]  4h 350/600 2K/2.5 Ca [place] 71kg (lower now < 66 kg) Heparin 2000 then 1500 u/hour Max UF 1.5L/ hr  EPO 4000 TIW, Hectorol 1ug TIW   Assessment:  1 Fall / left hip fracture- for surgery today  2 ESRD on HD since 2006 3 Recent HD cath removal (R chest)- done at [facility] for refractory culture-neg fevers 4 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, baseline SBP 60's - 80's 5 Volume no gross excess, dry wt is lower now 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 6 Anemia cont ESA  7 HPTH cont renvela, sensipar and vit D    Plan- to OR today, next HD tomorrow or possibly Sat    Kelly Splinter MD  pager [phone]    cell [phone]  06/07/2014, 3:09 PM     Recent Labs Lab 06/05/14 1424 06/06/14 1918 06/07/14 0710  NA 141 134* 137  K 3.8 4.9 4.9  CL 98 92* 97  CO2 21 22 24   GLUCOSE 79 98 92  BUN 21 28* 19  CREATININE 7.24* 9.27* 7.35*  CALCIUM 8.2* 7.7* 7.9*  PHOS  --  3.2  --     Recent Labs Lab 06/06/14 1918  ALBUMIN 3.1*    Recent Labs Lab 06/05/14 1424 06/06/14 0526 06/07/14 0710  WBC 5.9 5.2 7.4  NEUTROABS 4.3  --   --   HGB 9.5* 9.9* 10.0*  HCT 28.4* 30.2* 30.5*  MCV 99.0 100.0 96.8  PLT 200 207 195   . allopurinol  100 mg Oral Daily  . aspirin  81 mg Oral Daily   . cinacalcet  90 mg Oral Q supper  . heparin  5,000 Units Dialysis Once in dialysis  . metoCLOPramide  20 mg Oral TID AC  . midodrine  10 mg Oral TID WC  . sevelamer carbonate  3,200 mg Oral TID WC  . sodium chloride  3 mL Intravenous Q12H  . sodium chloride  3 mL Intravenous Q12H  . venlafaxine XR  75 mg Oral Q breakfast     sodium chloride, sodium chloride, sodium chloride, acetaminophen, feeding supplement (NEPRO CARB STEADY), heparin, HYDROcodone-acetaminophen, HYDROmorphone (DILAUDID) injection, lidocaine (PF), lidocaine-prilocaine, ondansetron (ZOFRAN) IV, ondansetron, pentafluoroprop-tetrafluoroeth, polyethylene glycol, sevelamer carbonate, sodium chloride

## 2014-06-07 NOTE — Progress Notes (Signed)
[facility] PROGRESS NOTE  AHMANI PREHN NTZ:[id number] DOB: May 04, 1946 DOA: 06/05/2014 PCP: Glo Herring., MD  Assessment/Plan: 1. L Hip fracture -for OR today per Dr.Blackman, HD catheter in same side -will use lovenox for DVT proph post op if ok with ORtho -PT, will need [facility]  2. Cardiac amyloidosis -followed closely by Cards at [facility] -stable, h/o RV failure -continue midodrine  3. ESRD on HD  -HD cath in L groin, limited access options -HD per Renal  4. Chronic hypotension -BP runs in 70-80s range, asymptomatic -continue midodrine  5. Anemia of chronic disease -stable  DVt proph: lovenox resume post op  Code Status: full Code Family Communication: d/w family at bedside Disposition Plan: [place] [facility]  Consultants:  Renal   Cards  Ortho  HPI/Subjective: Feels ok, no dyspnea/chest pain, awaiting surgery  Objective: Filed Vitals:   06/07/14 1031  BP: 79/23  Pulse: 84  Temp: 99.5 F (37.5 C)  Resp: 18    Intake/Output Summary (Last 24 hours) at 06/07/14 1319 Last data filed at 06/07/14 0000  Gross per 24 hour  Intake    360 ml  Output   4009 ml  Net  -3649 ml   Filed Weights   06/05/14 2100 06/06/14 1830 06/06/14 2219  Weight: 69.7 kg (153 lb 10.6 oz) 68.9 kg (151 lb 14.4 oz) 65.3 kg (143 lb 15.4 oz)    Exam:   General:  AAOx3, no distress  Cardiovascular: S1S2/RRR  Respiratory: CTAB  Abdomen: soft, mildly distended  Musculoskeletal: no edema c/c, HD catheter in L groin, did not mobilize L hip   Data Reviewed: Basic Metabolic Panel:  Recent Labs Lab 06/05/14 1424 06/06/14 1918 06/07/14 0710  NA 141 134* 137  K 3.8 4.9 4.9  CL 98 92* 97  CO2 21 22 24   GLUCOSE 79 98 92  BUN 21 28* 19  CREATININE 7.24* 9.27* 7.35*  CALCIUM 8.2* 7.7* 7.9*  PHOS  --  3.2  --    Liver Function Tests:  Recent Labs Lab 06/06/14 1918  ALBUMIN 3.1*   No results found for this basename: LIPASE, AMYLASE,  in the last 168 hours No  results found for this basename: AMMONIA,  in the last 168 hours CBC:  Recent Labs Lab 06/05/14 1424 06/06/14 0526 06/07/14 0710  WBC 5.9 5.2 7.4  NEUTROABS 4.3  --   --   HGB 9.5* 9.9* 10.0*  HCT 28.4* 30.2* 30.5*  MCV 99.0 100.0 96.8  PLT 200 207 195   Cardiac Enzymes: No results found for this basename: CKTOTAL, CKMB, CKMBINDEX, TROPONINI,  in the last 168 hours BNP (last 3 results)  Recent Labs  02/12/14 1139 05/09/14 1100  PROBNP 21129.0* 32097.0*   CBG: No results found for this basename: GLUCAP,  in the last 168 hours  Recent Results (from the past 240 hour(s))  SURGICAL PCR SCREEN     Status: None   Collection Time    06/07/14  8:30 AM      Result Value Ref Range Status   MRSA, PCR NEGATIVE  NEGATIVE Final   Staphylococcus aureus NEGATIVE  NEGATIVE Final   Comment:            The Xpert SA Assay (FDA     approved for NASAL specimens     in patients over 29 years of age),     is one component of     a comprehensive surveillance     program.  Test performance has  been validated by [facility] for patients greater     than or equal to 11 year old.     It is not intended     to diagnose infection nor to     guide or monitor treatment.     Studies: Dg Chest 1 View  06/05/2014   CLINICAL DATA:  Pre operative respiratory exam.  Left hip fracture.  EXAM: CHEST - 1 VIEW  COMPARISON:  CT scan and chest x-ray dated 05/09/2014  FINDINGS: There is chronic cardiomegaly. Dialysis catheter with the tip in the right atrium. Lungs are clear. Pulmonary vascularity is normal. No acute osseous abnormality.  IMPRESSION: No acute abnormality.  Chronic cardiomegaly.   Electronically Signed   By: Rozetta Nunnery M.D.   On: 06/05/2014 15:15   Dg Hip Complete Left  06/05/2014   CLINICAL DATA:  Left hip pain secondary to a fall.  EXAM: LEFT HIP - COMPLETE 2+ VIEW  COMPARISON:  None.  FINDINGS: There is an angulated overriding subcapital fracture of the left femoral neck.  Osteopenia. Pelvic bones are intact.  IMPRESSION: Angulated subcapital fracture of the proximal left femur.   Electronically Signed   By: Rozetta Nunnery M.D.   On: 06/05/2014 15:13   Dg Femur Left Port  06/05/2014   CLINICAL DATA:  Status post fall  EXAM: PORTABLE LEFT FEMUR - 2 VIEW  COMPARISON:  None.  FINDINGS: Mildly displaced and angulated left femoral neck fracture. No dislocation. No lytic or sclerotic osseous lesion. Tricompartmental osteoarthritis of the left knee.  IMPRESSION: Mildly displaced and angulated left femoral neck fracture.   Electronically Signed   By: Kathreen Devoid   On: 06/05/2014 16:24    Scheduled Meds: . allopurinol  100 mg Oral Daily  . aspirin  81 mg Oral Daily  . cinacalcet  90 mg Oral Q supper  . heparin  5,000 Units Dialysis Once in dialysis  . metoCLOPramide  20 mg Oral TID AC  . midodrine  10 mg Oral TID WC  . sevelamer carbonate  3,200 mg Oral TID WC  . sodium chloride  3 mL Intravenous Q12H  . sodium chloride  3 mL Intravenous Q12H  . venlafaxine XR  75 mg Oral Q breakfast   Continuous Infusions:  Antibiotics Given (last 72 hours)   None      Principal Problem:   Subcapital fracture of femur Active Problems:   ESRD (end stage renal disease)   Amyloid heart disease   Chronic diastolic congestive heart failure, NYHA class 2   Closed left hip fracture    Time spent:17min    [facility]  [facility] Pager [phone]. If 7PM-7AM, please contact night-coverage at [email or web address], password [facility] 06/07/2014, 1:19 PM  LOS: 2 days

## 2014-06-07 NOTE — Anesthesia Procedure Notes (Addendum)
Procedure Name: Intubation Date/Time: 06/07/2014 6:16 PM Performed by: Weinfeld Heys E Pre-anesthesia Checklist: Patient identified, Emergency Drugs available, Suction available and Patient being monitored Patient Re-evaluated:Patient Re-evaluated prior to inductionOxygen Delivery Method: Circle system utilized Preoxygenation: Pre-oxygenation with 100% oxygen Intubation Type: IV induction Ventilation: Mask ventilation without difficulty and Oral airway inserted - appropriate to patient size Laryngoscope Size: Sabra Heck and 2 Grade View: Grade I Tube type: Oral Tube size: 7.5 mm Number of attempts: 1 Airway Equipment and Method: Stylet and Oral airway Placement Confirmation: ETT inserted through vocal cords under direct vision,  positive ETCO2 and breath sounds checked- equal and bilateral Secured at: 22 cm Tube secured with: Tape Dental Injury: Teeth and Oropharynx as per pre-operative assessment

## 2014-06-07 NOTE — Anesthesia Preprocedure Evaluation (Addendum)
Anesthesia Evaluation  Patient identified by MRN, date of birth, ID band Patient awake    Reviewed: Allergy & Precautions, H&P , NPO status , Patient's Chart, lab work & pertinent test results  Airway Mallampati: I TM Distance: >3 FB Neck ROM: Full    Dental  (+) Dental Advisory Given, Teeth Intact, Chipped   Pulmonary former smoker,  breath sounds clear to auscultation        Cardiovascular hypertension, +CHF + dysrhythmias Atrial Fibrillation Rhythm:Regular Rate:Normal     Neuro/Psych    GI/Hepatic GERD-  Medicated,  Endo/Other    Renal/GU ESRF and DialysisRenal disease     Musculoskeletal  (+) Arthritis -,   Abdominal   Peds  Hematology   Anesthesia Other Findings   Reproductive/Obstetrics                         Anesthesia Physical Anesthesia Plan  ASA: IV  Anesthesia Plan: General   Post-op Pain Management:    Induction: Intravenous  Airway Management Planned: Oral ETT  Additional Equipment: Arterial line  Intra-op Plan:   Post-operative Plan: Extubation in OR  Informed Consent: I have reviewed the patients History and Physical, chart, labs and discussed the procedure including the risks, benefits and alternatives for the proposed anesthesia with the patient or authorized representative who has indicated his/her understanding and acceptance.   Dental advisory given  Plan Discussed with: CRNA, Anesthesiologist and Surgeon  Anesthesia Plan Comments:        Anesthesia Quick Evaluation

## 2014-06-07 NOTE — Brief Op Note (Signed)
06/05/2014 - 06/07/2014  7:20 PM  PATIENT:  [name]  68 y.o. male  PRE-OPERATIVE DIAGNOSIS:  Left hip fracture  POST-OPERATIVE DIAGNOSIS:  Left hip fracture  PROCEDURE:  Procedure(s): LEFT HIP HEMIARTHROPLASTY (Left)  SURGEON:  Surgeon(s) and Role:    * Mcarthur Rossetti, MD - Primary  PHYSICIAN ASSISTANT: Benita Stabile, PA-C  ANESTHESIA:   general  EBL:  Total I/O In: 250 [IV Piggyback:250] Out: -   BLOOD ADMINISTERED:none  DRAINS: none   LOCAL MEDICATIONS USED:  NONE  SPECIMEN:  No Specimen  DISPOSITION OF SPECIMEN:  N/A  COUNTS:  YES  TOURNIQUET:  * No tourniquets in log *  DICTATION: .Other Dictation: Dictation Number [id number]  PLAN OF CARE: Admit to inpatient   PATIENT DISPOSITION:  PACU - hemodynamically stable.   Delay start of Pharmacological VTE agent (>24hrs) due to surgical blood loss or risk of bleeding: no

## 2014-06-07 NOTE — Transfer of Care (Signed)
Immediate Anesthesia Transfer of Care Note  Patient: [name]  Procedure(s) Performed: Procedure(s): LEFT HIP HEMIARTHROPLASTY (Left)  Patient Location: PACU  Anesthesia Type:General  Level of Consciousness: awake, oriented and patient cooperative  Airway & Oxygen Therapy: Patient Spontanous Breathing and Patient connected to nasal cannula oxygen  Post-op Assessment: Report given to PACU RN, Post -op Vital signs reviewed and stable and Patient moving all extremities X 4  Post vital signs: Reviewed and stable  Complications: No apparent anesthesia complications

## 2014-06-07 NOTE — Progress Notes (Addendum)
Patient ID: [name], male   DOB: 12-May-1946, 68 y.o.   MRN: [id number]      Patient: [name] / Admit Date: 06/05/2014 / Date of Encounter: 06/07/2014, 9:45 AM   Subjective: Doing well. No palpitations, SOB, or chest pain. He is for surgery this AM.     Objective: Telemetry: afib, several episodes of PVCs, with a 4 beat NSVT occuring at 2:37 AM.  Physical Exam: Blood pressure 87/37, pulse 83, temperature 98 F (36.7 C), temperature source Oral, resp. rate 20, height 5\' 10"  (1.778 m), weight 143 lb 15.4 oz (65.3 kg), SpO2 96.00%. General: Well developed, well nourished, in no acute distress. Head: Normocephalic, atraumatic, sclera non-icteric, no xanthomas, nares are without discharge. Neck: Negative for carotid bruits. JVP not elevated. Lungs: Clear bilaterally to auscultation without wheezes, rales, or rhonchi. Breathing is unlabored. Via anterior exam. Heart: Irregularly irregular. S1 S2 without murmurs, rubs, or gallops.  Abdomen: Soft, non-tender, non-distended with normoactive bowel sounds. No rebound/guarding. Extremities: No clubbing or cyanosis. No edema. Distal pedal pulses are 2+ and equal bilaterally. Neuro: Alert and oriented X 3. Moves all extremities spontaneously. Psych:  Responds to questions appropriately with a normal affect.   Intake/Output Summary (Last 24 hours) at 06/07/14 0945 Last data filed at 06/07/14 0000  Gross per 24 hour  Intake    360 ml  Output   4009 ml  Net  -3649 ml    Inpatient Medications:  . allopurinol  100 mg Oral Daily  . aspirin  81 mg Oral Daily  . cinacalcet  90 mg Oral Q supper  . heparin  5,000 Units Dialysis Once in dialysis  . metoCLOPramide  20 mg Oral TID AC  . midodrine  10 mg Oral TID WC  . sevelamer carbonate  3,200 mg Oral TID WC  . sodium chloride  3 mL Intravenous Q12H  . sodium chloride  3 mL Intravenous Q12H  . venlafaxine XR  75 mg Oral Q breakfast   Infusions:    Labs:  Recent Labs  06/06/14 1918  06/07/14 0710  NA 134* 137  K 4.9 4.9  CL 92* 97  CO2 22 24  GLUCOSE 98 92  BUN 28* 19  CREATININE 9.27* 7.35*  CALCIUM 7.7* 7.9*  PHOS 3.2  --     Recent Labs  06/06/14 1918  ALBUMIN 3.1*    Recent Labs  06/05/14 1424 06/06/14 0526 06/07/14 0710  WBC 5.9 5.2 7.4  NEUTROABS 4.3  --   --   HGB 9.5* 9.9* 10.0*  HCT 28.4* 30.2* 30.5*  MCV 99.0 100.0 96.8  PLT 200 207 195   No results found for this basename: CKTOTAL, CKMB, TROPONINI,  in the last 72 hours No components found with this basename: POCBNP,  No results found for this basename: HGBA1C,  in the last 72 hours   Radiology/Studies:  Dg Chest 1 View  06/05/2014   CLINICAL DATA:  Pre operative respiratory exam.  Left hip fracture.  EXAM: CHEST - 1 VIEW  COMPARISON:  CT scan and chest x-ray dated 05/09/2014  FINDINGS: There is chronic cardiomegaly. Dialysis catheter with the tip in the right atrium. Lungs are clear. Pulmonary vascularity is normal. No acute osseous abnormality.  IMPRESSION: No acute abnormality.  Chronic cardiomegaly.   Electronically Signed   By: Rozetta Nunnery M.D.   On: 06/05/2014 15:15   Dg Hip Complete Left  06/05/2014   CLINICAL DATA:  Left hip pain secondary to a fall.  EXAM: LEFT HIP - COMPLETE 2+ VIEW  COMPARISON:  None.  FINDINGS: There is an angulated overriding subcapital fracture of the left femoral neck. Osteopenia. Pelvic bones are intact.  IMPRESSION: Angulated subcapital fracture of the proximal left femur.   Electronically Signed   By: Rozetta Nunnery M.D.   On: 06/05/2014 15:13   Ct Angio Chest Pe W/cm &/or Wo Cm  05/09/2014   CLINICAL DATA:  Hypoxia.  Chest pain.  EXAM: CT ANGIOGRAPHY CHEST WITH CONTRAST  TECHNIQUE: Multidetector CT imaging of the chest was performed using the standard protocol during bolus administration of intravenous contrast. Multiplanar CT image reconstructions and MIPs were obtained to evaluate the vascular anatomy.  CONTRAST:  136mL OMNIPAQUE IOHEXOL 350 MG/ML SOLN   COMPARISON:  Chest x-ray 05/09/2014.  FINDINGS: Thoracic aorta is unremarkable. No evidence of aneurysm or dissection. Cardiomegaly. Coronary artery disease. Tiny pericardial effusion. Pulmonary arteries are normal.  No significant mediastinal or hilar adenopathy. Thoracic esophagus unremarkable.  Large airways patent. Bibasilar atelectasis. No focal pulmonary infiltrates. No evidence of mass. No pleural effusion or pneumothorax.  Prominent ascites. Omental soft tissue thickening is noted. This may be related to ascites however omental tumor cannot be excluded and CT of the abdomen pelvis should be considered for further evaluation. Cholecystectomy.  Thyroid unremarkable. No supraclavicular or axillary adenopathy. Bilateral IJ catheters are present. Degenerative changes thoracic spine.  Review of the MIP images confirms the above findings.  IMPRESSION: 1. Cardiomegaly. Coronary disease. Tiny pericardial effusion. No pulmonary embolus. 2. Prominent ascites. Omental soft tissue thickened is noted. CT of the abdomen and pelvis should be considered for further evaluation of these findings.   Electronically Signed   By: Marcello Moores  Register   On: 05/09/2014 15:17   US Abdomen Limited  05/09/2014   CLINICAL DATA:  End-stage renal disease.  EXAM: LIMITED ABDOMEN ULTRASOUND FOR ASCITES  TECHNIQUE: Limited ultrasound survey for ascites was performed in all four abdominal quadrants.  COMPARISON:  Ultrasound 06/01/2013.  FINDINGS: Moderate ascites noted throughout the abdomen.  IMPRESSION: Moderate diffuse ascites.   Electronically Signed   By: Marcello Moores  Register   On: 05/09/2014 15:29   US Paracentesis  05/10/2014   CLINICAL DATA:  End-stage renal disease, ascites, shortness of breath  EXAM: ULTRASOUND GUIDED DIAGNOSTIC AND THERAPEUTIC PARACENTESIS  COMPARISON:  Limited abdomen ultrasound 05/09/2014  PROCEDURE: Procedure, benefits, and risks of procedure were discussed with patient.  Written informed consent for procedure was  obtained.  Time out protocol followed.  Adequate collection of ascites localized in RIGHT lower quadrant.  Skin prepped and draped in usual sterile fashion.  Skin and soft tissues anesthetized with 9 mL of 1% lidocaine.  5 [place] Yueh catheter placed into peritoneal cavity.  4660 ml of peach colored fluid aspirated by vacuum bottle suction.  Procedure tolerated well by patient without immediate complication.  FINDINGS: A total of approximately 4660 mL of ascitic fluid was removed. A fluid sample of 180 mL was sent for laboratory analysis.  IMPRESSION: Successful ultrasound guided paracentesis yielding 4660 mL of ascites.   Electronically Signed   By: Lavonia Dana M.D.   On: 05/10/2014 11:50   Dg Chest Portable 1 View  05/09/2014   CLINICAL DATA:  Chest pain, end-stage renal disease, amyloid cardiomyopathy  EXAM: PORTABLE CHEST - 1 VIEW  COMPARISON:  Portable chest x-ray of February 12, 2014  FINDINGS: The lungs are adequately inflated. There is no focal infiltrate. The interstitial markings are mildly prominent though stable. The cardiac silhouette is top-normal  in size but stable. There is no pleural effusion or significant pulmonary vascular and congestion. Two large caliber dialysis type catheters are in place via bilateral internal jugular approach. The tips lie in the mid and distal portions of the SVC. The bony thorax is unremarkable.  IMPRESSION: There is no acute cardiopulmonary abnormality.   Electronically Signed   By: David  Martinique   On: 05/09/2014 11:12   Dg Femur Left Port  06/05/2014   CLINICAL DATA:  Status post fall  EXAM: PORTABLE LEFT FEMUR - 2 VIEW  COMPARISON:  None.  FINDINGS: Mildly displaced and angulated left femoral neck fracture. No dislocation. No lytic or sclerotic osseous lesion. Tricompartmental osteoarthritis of the left knee.  IMPRESSION: Mildly displaced and angulated left femoral neck fracture.   Electronically Signed   By: Kathreen Devoid   On: 06/05/2014 16:24     Assessment and  Plan   Problem list: 1. Subcapital fracture of the left hip s/p mechanical fall 2. A fib 3. ESRD 4. Right IJ thrombus 5. Chronic diastolic CHF, NYHA class 2 6. Amyloid heart disease  Plan:   1) Subcapital fracture of the left hip s/p mechanical fall:  -He is for surgery today with partial hip replacement  2) A fib -Rate controlled with rates in the 70s -BP soft, unable to add beta blocker at this time -Not currently on anticoagulation, CHADSVASc 3, consider adding s/p surgery  3) ESRD -Dialysis 06/06/14 -K+ 4.9    Signed, Christell Faith, PA-C 06/07/2014 9:46 AM   Attending Note:   The patient was seen and examined.  Agree with assessment and plan as noted above.  Changes made to the above note as needed.  He is ok for surgery.   Stable, no dyspnea.   Thayer Headings, Brooke Bonito., MD, [facility] 06/07/2014, 10:52 AM [address],  [place] Pager [phone]

## 2014-06-07 NOTE — ED Provider Notes (Signed)
Medical screening examination/treatment/procedure(s) were conducted as a shared visit with non-physician practitioner(s) and myself.  I personally evaluated the patient during the encounter. Please see my previous note.   EKG Interpretation   Date/Time:  Tuesday June 05 2014 16:20:27 EDT Ventricular Rate:  76 PR Interval:    QRS Duration: 113 QT Interval:  557 QTC Calculation: 626 R Axis:   -108 Text Interpretation:  Atrial fibrillation Borderline intraventricular  conduction delay Low voltage, extremity leads Abnormal R-wave progression,  late transition Consider inferior infarct Prolonged QT interval Lead(s) V2  were not used for morphology analysis Very low voltage persists Confirmed  by Claiborne Billings  MD, THOMAS ([id number]) on 06/06/2014 10:44:42 AM        Francine Graven, DO 06/07/14 1536

## 2014-06-07 NOTE — Anesthesia Postprocedure Evaluation (Signed)
Anesthesia Post Note  Patient: [name]  Procedure(s) Performed: Procedure(s) (LRB): LEFT HIP HEMIARTHROPLASTY (Left)  Anesthesia type: general  Patient location: PACU  Post pain: Pain level controlled  Post assessment: Patient's Cardiovascular Status Stable  Last Vitals:  Filed Vitals:   06/07/14 2102  BP: 95/48  Pulse: 98  Temp: 37 C  Resp: 16    Post vital signs: Reviewed and stable  Level of consciousness: sedated  Complications: No apparent anesthesia complications

## 2014-06-08 ENCOUNTER — Encounter (HOSPITAL_COMMUNITY): Payer: Self-pay | Admitting: Orthopaedic Surgery

## 2014-06-08 LAB — CBC
HCT: 28 % — ABNORMAL LOW (ref 39.0–52.0)
Hemoglobin: 9.2 g/dL — ABNORMAL LOW (ref 13.0–17.0)
MCH: 32.9 pg (ref 26.0–34.0)
MCHC: 32.9 g/dL (ref 30.0–36.0)
MCV: 100 fL (ref 78.0–100.0)
PLATELETS: 188 10*3/uL (ref 150–400)
RBC: 2.8 MIL/uL — ABNORMAL LOW (ref 4.22–5.81)
RDW: 14.8 % (ref 11.5–15.5)
WBC: 6.3 10*3/uL (ref 4.0–10.5)

## 2014-06-08 LAB — BASIC METABOLIC PANEL
Anion gap: 19 — ABNORMAL HIGH (ref 5–15)
BUN: 28 mg/dL — ABNORMAL HIGH (ref 6–23)
CHLORIDE: 97 meq/L (ref 96–112)
CO2: 22 meq/L (ref 19–32)
Calcium: 7.7 mg/dL — ABNORMAL LOW (ref 8.4–10.5)
Creatinine, Ser: 8.72 mg/dL — ABNORMAL HIGH (ref 0.50–1.35)
GFR calc Af Amer: 6 mL/min — ABNORMAL LOW (ref >90)
GFR calc non Af Amer: 6 mL/min — ABNORMAL LOW (ref >90)
Glucose, Bld: 100 mg/dL — ABNORMAL HIGH (ref 70–99)
POTASSIUM: 5.9 meq/L — AB (ref 3.7–5.3)
Sodium: 138 mEq/L (ref 137–147)

## 2014-06-08 MED ORDER — MIDODRINE HCL 5 MG PO TABS
ORAL_TABLET | ORAL | Status: AC
  Filled 2014-06-08: qty 2

## 2014-06-08 MED ORDER — DARBEPOETIN ALFA-POLYSORBATE 25 MCG/0.42ML IJ SOLN
INTRAMUSCULAR | Status: AC
  Administered 2014-06-08: 25 ug via SUBCUTANEOUS
  Filled 2014-06-08: qty 0.42

## 2014-06-08 MED ORDER — ASPIRIN EC 325 MG PO TBEC: 325.0000 mg | DELAYED_RELEASE_TABLET | Freq: Two times a day (BID) | ORAL | Status: DC

## 2014-06-08 MED ORDER — SENNOSIDES-DOCUSATE SODIUM 8.6-50 MG PO TABS: 1.0000 | ORAL_TABLET | Freq: Two times a day (BID) | ORAL | Status: DC | PRN

## 2014-06-08 MED ORDER — HYDROCODONE-ACETAMINOPHEN 5-325 MG PO TABS: 1.0000 | ORAL_TABLET | ORAL | Status: DC | PRN

## 2014-06-08 MED ORDER — DOXERCALCIFEROL 4 MCG/2ML IV SOLN
INTRAVENOUS | Status: AC
  Administered 2014-06-08: 1 ug via INTRAVENOUS
  Filled 2014-06-08: qty 2

## 2014-06-08 MED ORDER — HYDROMORPHONE HCL PF 1 MG/ML IJ SOLN
INTRAMUSCULAR | Status: AC
  Filled 2014-06-08: qty 1

## 2014-06-08 NOTE — Progress Notes (Signed)
Rehab Admissions Coordinator Note:  Patient was screened by Cleatrice Burke for appropriateness for an Inpatient Acute Rehab Consult per PT recommendations. At this time, we are recommending [place]. I spoke with [facility]  Representative and they will not approve admission for this diagnosis. I alerted RN CM. [phone]  Cleatrice Burke 06/08/2014, 12:59 PM

## 2014-06-08 NOTE — Progress Notes (Addendum)
Patient: [name] / Admit Date: 06/05/2014 / Date of Encounter: 06/08/2014, 11:26 AM   Subjective: Doing well. No complaints. No chest pain, palpitations, or SOB.    Objective: Telemetry: a fib, 2 episodes of 9 beat NSVT (5:30 AM/9:30 AM) Physical Exam: Blood pressure 97/33, pulse 98, temperature 98.5 F (36.9 C), temperature source Oral, resp. rate 18, height 5\' 10"  (1.778 m), weight 143 lb 15.4 oz (65.3 kg), SpO2 98.00%. General: Well developed, well nourished, in no acute distress. Head: Normocephalic, atraumatic, sclera non-icteric, no xanthomas, nares are without discharge. Neck: Negative for carotid bruits. JVP not elevated. Lungs: Clear bilaterally to auscultation without wheezes, rales, or rhonchi. Breathing is unlabored. Heart: Irregularly irregular. S1 S2 without murmurs, rubs, or gallops.  Abdomen: Soft, non-tender, non-distended with normoactive bowel sounds. No rebound/guarding. Extremities: No clubbing or cyanosis. No edema. Distal pedal pulses are 2+ and equal bilaterally. Neuro: Alert and oriented X 3. Moves all extremities spontaneously. Psych:  Responds to questions appropriately with a normal affect.   Intake/Output Summary (Last 24 hours) at 06/08/14 1126 Last data filed at 06/08/14 0800  Gross per 24 hour  Intake    670 ml  Output    100 ml  Net    570 ml    Inpatient Medications:  . allopurinol  100 mg Oral Daily  . aspirin  81 mg Oral Daily  . cinacalcet  90 mg Oral Q supper  . darbepoetin  25 mcg Subcutaneous Q Fri-HD  . docusate sodium  100 mg Oral BID  . doxercalciferol  1 mcg Intravenous Q M,W,F-HD  . enoxaparin (LOVENOX) injection  30 mg Subcutaneous Q24H  . heparin  5,000 Units Dialysis Once in dialysis  . metoCLOPramide  20 mg Oral TID AC  . midodrine  10 mg Oral TID WC  . sevelamer carbonate  3,200 mg Oral TID WC  . sodium chloride  3 mL Intravenous Q12H  . sodium chloride  3 mL Intravenous Q12H  . venlafaxine XR  75 mg Oral Q  breakfast   Infusions:  . sodium chloride 50 mL/hr at 06/07/14 2109    Labs:  Recent Labs  06/06/14 1918 06/07/14 0710 06/08/14 0450  NA 134* 137 138  K 4.9 4.9 5.9*  CL 92* 97 97  CO2 22 24 22   GLUCOSE 98 92 100*  BUN 28* 19 28*  CREATININE 9.27* 7.35* 8.72*  CALCIUM 7.7* 7.9* 7.7*  PHOS 3.2  --   --     Recent Labs  06/06/14 1918  ALBUMIN 3.1*    Recent Labs  06/05/14 1424  06/07/14 0710 06/08/14 0450  WBC 5.9  < > 7.4 6.3  NEUTROABS 4.3  --   --   --   HGB 9.5*  < > 10.0* 9.2*  HCT 28.4*  < > 30.5* 28.0*  MCV 99.0  < > 96.8 100.0  PLT 200  < > 195 188  < > = values in this interval not displayed. No results found for this basename: CKTOTAL, CKMB, TROPONINI,  in the last 72 hours No components found with this basename: POCBNP,  No results found for this basename: HGBA1C,  in the last 72 hours   Radiology/Studies:  Dg Chest 1 View  06/05/2014   CLINICAL DATA:  Pre operative respiratory exam.  Left hip fracture.  EXAM: CHEST - 1 VIEW  COMPARISON:  CT scan and chest x-ray dated 05/09/2014  FINDINGS: There is chronic cardiomegaly. Dialysis catheter with the tip in the  right atrium. Lungs are clear. Pulmonary vascularity is normal. No acute osseous abnormality.  IMPRESSION: No acute abnormality.  Chronic cardiomegaly.   Electronically Signed   By: Rozetta Nunnery M.D.   On: 06/05/2014 15:15   Dg Hip Complete Left  06/05/2014   CLINICAL DATA:  Left hip pain secondary to a fall.  EXAM: LEFT HIP - COMPLETE 2+ VIEW  COMPARISON:  None.  FINDINGS: There is an angulated overriding subcapital fracture of the left femoral neck. Osteopenia. Pelvic bones are intact.  IMPRESSION: Angulated subcapital fracture of the proximal left femur.   Electronically Signed   By: Rozetta Nunnery M.D.   On: 06/05/2014 15:13   Ct Angio Chest Pe W/cm &/or Wo Cm  05/09/2014   CLINICAL DATA:  Hypoxia.  Chest pain.  EXAM: CT ANGIOGRAPHY CHEST WITH CONTRAST  TECHNIQUE: Multidetector CT imaging of the  chest was performed using the standard protocol during bolus administration of intravenous contrast. Multiplanar CT image reconstructions and MIPs were obtained to evaluate the vascular anatomy.  CONTRAST:  134mL OMNIPAQUE IOHEXOL 350 MG/ML SOLN  COMPARISON:  Chest x-ray 05/09/2014.  FINDINGS: Thoracic aorta is unremarkable. No evidence of aneurysm or dissection. Cardiomegaly. Coronary artery disease. Tiny pericardial effusion. Pulmonary arteries are normal.  No significant mediastinal or hilar adenopathy. Thoracic esophagus unremarkable.  Large airways patent. Bibasilar atelectasis. No focal pulmonary infiltrates. No evidence of mass. No pleural effusion or pneumothorax.  Prominent ascites. Omental soft tissue thickening is noted. This may be related to ascites however omental tumor cannot be excluded and CT of the abdomen pelvis should be considered for further evaluation. Cholecystectomy.  Thyroid unremarkable. No supraclavicular or axillary adenopathy. Bilateral IJ catheters are present. Degenerative changes thoracic spine.  Review of the MIP images confirms the above findings.  IMPRESSION: 1. Cardiomegaly. Coronary disease. Tiny pericardial effusion. No pulmonary embolus. 2. Prominent ascites. Omental soft tissue thickened is noted. CT of the abdomen and pelvis should be considered for further evaluation of these findings.   Electronically Signed   By: Marcello Moores  Register   On: 05/09/2014 15:17   Dg Pelvis Portable  06/07/2014   CLINICAL DATA:  LEFT hip hemiarthroplasty. Postoperative radiographs.  EXAM: PORTABLE PELVIS 1-2 VIEWS  COMPARISON:  None.  FINDINGS: New LEFT hip hemiarthroplasty. LEFT femoral dialysis catheter projects over the LEFT hip. Expected postsurgical changes in the soft tissues. There are no complicating features. The hemiarthroplasty appears located. Moderate RIGHT hip osteoarthritis incidentally noted.  IMPRESSION: Uncomplicated new LEFT total hip arthroplasty.   Electronically Signed    By: Dereck Ligas M.D.   On: 06/07/2014 20:25   US Abdomen Limited  05/09/2014   CLINICAL DATA:  End-stage renal disease.  EXAM: LIMITED ABDOMEN ULTRASOUND FOR ASCITES  TECHNIQUE: Limited ultrasound survey for ascites was performed in all four abdominal quadrants.  COMPARISON:  Ultrasound 06/01/2013.  FINDINGS: Moderate ascites noted throughout the abdomen.  IMPRESSION: Moderate diffuse ascites.   Electronically Signed   By: Marcello Moores  Register   On: 05/09/2014 15:29   US Paracentesis  05/10/2014   CLINICAL DATA:  End-stage renal disease, ascites, shortness of breath  EXAM: ULTRASOUND GUIDED DIAGNOSTIC AND THERAPEUTIC PARACENTESIS  COMPARISON:  Limited abdomen ultrasound 05/09/2014  PROCEDURE: Procedure, benefits, and risks of procedure were discussed with patient.  Written informed consent for procedure was obtained.  Time out protocol followed.  Adequate collection of ascites localized in RIGHT lower quadrant.  Skin prepped and draped in usual sterile fashion.  Skin and soft tissues anesthetized with 9 mL of  1% lidocaine.  5 [place] Yueh catheter placed into peritoneal cavity.  4660 ml of peach colored fluid aspirated by vacuum bottle suction.  Procedure tolerated well by patient without immediate complication.  FINDINGS: A total of approximately 4660 mL of ascitic fluid was removed. A fluid sample of 180 mL was sent for laboratory analysis.  IMPRESSION: Successful ultrasound guided paracentesis yielding 4660 mL of ascites.   Electronically Signed   By: Lavonia Dana M.D.   On: 05/10/2014 11:50   Dg Femur Left Port  06/05/2014   CLINICAL DATA:  Status post fall  EXAM: PORTABLE LEFT FEMUR - 2 VIEW  COMPARISON:  None.  FINDINGS: Mildly displaced and angulated left femoral neck fracture. No dislocation. No lytic or sclerotic osseous lesion. Tricompartmental osteoarthritis of the left knee.  IMPRESSION: Mildly displaced and angulated left femoral neck fracture.   Electronically Signed   By: Kathreen Devoid   On:  06/05/2014 16:24     Assessment and Plan  68 year old male with subcapital fracture of the left hip s/p mechanical fall, NSVT, chronic a fib, chronic hypotension, and ESRD.   1) Subcapital left hip fracture s/p mechanical fall -Post op day one s/p left hip hemiarthroplasty  -Per ortho  2) NSVT -9 beat run at 9:30 am and 5:10 am, normal EF, last cath 2004 with nonobstructive CAD. BPs soft.  -Consider adding PO amio.   3) Chronic a fib, dx May 2004 -Rate controlled -Not currently on [facility], and never has been on [facility]. CHADSVASc 3, now that surgery is complete consider AC  4) Chronic hypotension -Baseline SBP 60-80 -On Midodrine   5) ESRD -Next HD possibly today or Sat   Signed, Christell Faith, PA-C 06/08/2014 11:46 AM   Attending Note:   The patient was seen and examined.  Agree with assessment and plan as noted above.  Changes made to the above note as needed.  Patient is doing well. He had several brief runs of NSVT.  Also has lots of artifact.  No specific therapy needed.  Make sure electrolytes are normal. Unable to add beta blocker due to hypotension.   No further recs.  He will follow up with his doctors in [facility]  Will sign off.  Call for questins.   Thayer Headings, Brooke Bonito., MD, [place] [facility] 06/08/2014, 1:51 PM [address],  [place] Pager [phone]

## 2014-06-08 NOTE — Care Management Note (Signed)
CARE MANAGEMENT NOTE 06/08/2014  Patient:  [name], [name]   Account Number:  [id number]  Date Initiated:  06/08/2014  Documentation initiated by:  Ricki Miller  Subjective/Objective Assessment:   68 yr old male s/p left hip hemiarthroplasty. Pateint is on dialysis-Mon/Wed/Fri.     Action/Plan:   Patient and family want to check into CIR vs SNF vs Home Health. Case manager will continue to monitor. Will offer choice   Anticipated DC Date:     Anticipated DC Plan:           Choice offered to / List presented to:             Status of service:  In process, will continue to follow Medicare Important Message given?  YES (If response is "NO", the following Medicare IM given date fields will be blank) Date Medicare IM given:  06/08/2014 Medicare IM given by:  Ricki Miller Date Additional Medicare IM given:   Additional Medicare IM given by:    Discharge Disposition:    Per UR Regulation:  Reviewed for med. necessity/level of care/duration of stay

## 2014-06-08 NOTE — Progress Notes (Signed)
Orthopedic Tech Progress Note Patient Details:  [name] November 24, 1945 [id number]  Ortho Devices Ortho Device/Splint Location: put ohf on bed Ortho Device/Splint Interventions: Ordered;Application   Braulio Bosch 06/08/2014, 9:28 AM

## 2014-06-08 NOTE — Progress Notes (Signed)
OT Cancellation Note  Patient Details Name: [name] MRN: [id number] DOB: [birth year]   Cancelled Treatment:    Reason Eval/Treat Not Completed: Other (comment). Pt only back in bed for 30 minutes. Had been up since 10:30 this AM with PT worked with him. Will re-attempt eval in AM.  Almon Register [phone] 06/08/2014, 3:22 PM

## 2014-06-08 NOTE — Progress Notes (Signed)
  [facility] Progress Note   Subjective: No complaints  Filed Vitals:   06/08/14 0800 06/08/14 1145 06/08/14 1300 06/08/14 1600  BP:   88/23   Pulse:   88   Temp:   98.9 F (37.2 C)   TempSrc:      Resp: 18 18 18 18   Height:      Weight:      SpO2:   96%    Exam: Alert, no distress + jvd  Chest is clear bilat  RRR no MRG  Abd less tense today, soft, mildly distended, nontender, liver down 4 cm No LE or UE edema  L groin tunneled HD cath w clean exit site  Neuro is nf, ox 3  HD: MWF [facility]  4h 350/600 2K/2.5 Ca [place] 71kg (lower now < 66 kg) L fem HD cath  Heparin 2000 then 1500 u/hour Max UF 1.5L/ hr  EPO 4000 TIW, Hectorol 1ug TIW   Assessment:  1 Fall / left hip fracture- s/p ORIF 8/14 2 ESRD on HD since 2006,  3 Hyperkalemia- K+ up today 5.9 4 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, baseline SBP 60's - 80's 5 Volume excess -resolved, wt down, needs lower dry wt 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 6 Anemia cont ESA  7 HPTH cont renvela, sensipar and vit D 8 Recent HD cath removal (R chest)- done at [facility] for refractory culture-neg fevers    Plan- HD tonight, tight hep    Kelly Splinter MD  pager [phone]    cell [phone]  06/08/2014, 4:21 PM     Recent Labs Lab 06/06/14 1918 06/07/14 0710 06/08/14 0450  NA 134* 137 138  K 4.9 4.9 5.9*  CL 92* 97 97  CO2 22 24 22   GLUCOSE 98 92 100*  BUN 28* 19 28*  CREATININE 9.27* 7.35* 8.72*  CALCIUM 7.7* 7.9* 7.7*  PHOS 3.2  --   --     Recent Labs Lab 06/06/14 1918  ALBUMIN 3.1*    Recent Labs Lab 06/05/14 1424 06/06/14 0526 06/07/14 0710 06/08/14 0450  WBC 5.9 5.2 7.4 6.3  NEUTROABS 4.3  --   --   --   HGB 9.5* 9.9* 10.0* 9.2*  HCT 28.4* 30.2* 30.5* 28.0*  MCV 99.0 100.0 96.8 100.0  PLT 200 207 195 188   . allopurinol  100 mg Oral Daily  . aspirin  81 mg Oral Daily  . cinacalcet  90 mg Oral Q supper  . darbepoetin  25 mcg Subcutaneous Q Fri-HD  .  docusate sodium  100 mg Oral BID  . doxercalciferol  1 mcg Intravenous Q M,W,F-HD  . enoxaparin (LOVENOX) injection  30 mg Subcutaneous Q24H  . heparin  5,000 Units Dialysis Once in dialysis  . metoCLOPramide  20 mg Oral TID AC  . midodrine  10 mg Oral TID WC  . sevelamer carbonate  3,200 mg Oral TID WC  . sodium chloride  3 mL Intravenous Q12H  . sodium chloride  3 mL Intravenous Q12H  . venlafaxine XR  75 mg Oral Q breakfast   . sodium chloride 50 mL/hr at 06/07/14 2109   sodium chloride, sodium chloride, sodium chloride, acetaminophen, acetaminophen, feeding supplement (NEPRO CARB STEADY), heparin, HYDROcodone-acetaminophen, HYDROcodone-acetaminophen, HYDROmorphone (DILAUDID) injection, lidocaine (PF), lidocaine-prilocaine, menthol-cetylpyridinium, methocarbamol (ROBAXIN) IV, methocarbamol, metoCLOPramide (REGLAN) injection, metoCLOPramide, morphine injection, ondansetron (ZOFRAN) IV, ondansetron (ZOFRAN) IV ondansetron, ondansetron, pentafluoroprop-tetrafluoroeth, phenol, polyethylene glycol, senna-docusate, sevelamer carbonate, sodium chloride

## 2014-06-08 NOTE — Progress Notes (Signed)
Physical medicine and rehabilitation consult requested. AARP Medicare will not justify inpatient rehabilitation services. Plan to be discharged to home or skilled nursing facility

## 2014-06-08 NOTE — Discharge Instructions (Signed)
You can get your current left hip dressing wet daily in the shower. Leave this dressing on until your follow-up with Orthopedics as an outpatient. You can put full weight on your left hip; anterior hip precautions.

## 2014-06-08 NOTE — Evaluation (Signed)
Physical Therapy Evaluation Patient Details Name: [name] MRN: [id number] DOB: 1945/12/20 Today's Date: 06/08/2014   History of Present Illness  pt presents after a fall resulting in L Femur fx.  pt now post THA.    Clinical Impression  Pt very motivated to improve mobility and doing well today.  Pt would benefit from continued therapies at CIR to maximize independence and decrease burden of care prior to returning to home with his wife.  Feep pt will make great progress.  Will continue to follow.      Follow Up Recommendations Home health PT;Supervision - Intermittent    Equipment Recommendations  3in1 (PT)    Recommendations for Other Services       Precautions / Restrictions Precautions Precautions: Fall Restrictions Weight Bearing Restrictions: Yes LLE Weight Bearing: Weight bearing as tolerated      Mobility  Bed Mobility Overal bed mobility: Needs Assistance Bed Mobility: Supine to Sit     Supine to sit: Min assist;HOB elevated     General bed mobility comments: A with L LE.  cues for sequencing and technique.    Transfers Overall transfer level: Needs assistance Equipment used: Rolling walker (2 wheeled) Transfers: Sit to/from Stand Sit to Stand: Min assist         General transfer comment: MinA to stand from bed, bed only MinG from recliner.  Cues for UE use and controlling descent.    Ambulation/Gait Ambulation/Gait assistance: Min guard Ambulation Distance (Feet): 20 Feet (x2) Assistive device: Rolling walker (2 wheeled) Gait Pattern/deviations: Step-to pattern;Decreased step length - right;Decreased stance time - left;Decreased stride length   Gait velocity interpretation: Below normal speed for age/gender General Gait Details: cues for positioning within RW, upright posture, and encouragement.    Stairs            Wheelchair Mobility    Modified Rankin (Stroke Patients Only)       Balance Overall balance assessment: Needs  assistance Sitting-balance support: Single extremity supported;Feet supported Sitting balance-Leahy Scale: Poor     Standing balance support: Bilateral upper extremity supported Standing balance-Leahy Scale: Poor                               Pertinent Vitals/Pain Pain Assessment: 0-10 Pain Score: 8  Pain Location: L hip Pain Descriptors / Indicators: Sore Pain Intervention(s): Premedicated before session;Repositioned    Home Living Family/patient expects to be discharged to:: Private residence Living Arrangements: Spouse/significant other Available Help at Discharge: Family;Available 24 hours/day Type of Home: House Home Access: Stairs to enter Entrance Stairs-Rails: Right;Left;Can reach both Entrance Stairs-Number of Steps: 2 Home Layout: One level Home Equipment: Walker - 2 wheels;Walker - 4 wheels      Prior Function Level of Independence: Independent               Hand Dominance        Extremity/Trunk Assessment   Upper Extremity Assessment: Defer to OT evaluation           Lower Extremity Assessment: LLE deficits/detail   LLE Deficits / Details: Strength limited post-op.  AROM WFL.    Cervical / Trunk Assessment: Normal  Communication   Communication: No difficulties  Cognition Arousal/Alertness: Awake/alert Behavior During Therapy: WFL for tasks assessed/performed Overall Cognitive Status: Within Functional Limits for tasks assessed                      General  Comments      Exercises General Exercises - Lower Extremity Ankle Circles/Pumps: AROM;Both;10 reps Quad Sets: AROM;Both;10 reps Long Arc Quad: AROM;Left;10 reps      Assessment/Plan    PT Assessment Patient needs continued PT services  PT Diagnosis Difficulty walking;Acute pain   PT Problem List Decreased strength;Decreased activity tolerance;Decreased balance;Decreased mobility;Decreased knowledge of use of DME;Pain  PT Treatment Interventions DME  instruction;Gait training;Stair training;Functional mobility training;Therapeutic activities;Therapeutic exercise;Balance training;Patient/family education   PT Goals (Current goals can be found in the Care Plan section) Acute Rehab PT Goals Patient Stated Goal: Walk normal PT Goal Formulation: With patient Time For Goal Achievement: 06/15/14 Potential to Achieve Goals: Good    Frequency Min 5X/week   Barriers to discharge        Co-evaluation               End of Session Equipment Utilized During Treatment: Gait belt Activity Tolerance: Patient tolerated treatment well Patient left: in chair;with call bell/phone within reach;with family/visitor present Nurse Communication: Mobility status         Time: 1031-1100 PT Time Calculation (min): 29 min   Charges:   PT Evaluation $Initial PT Evaluation Tier I: 1 Procedure PT Treatments $Gait Training: 8-22 mins $Therapeutic Exercise: 8-22 mins   PT G CodesCatarina Hartshorn, [place] 06/08/2014, 11:15 AM

## 2014-06-08 NOTE — Clinical Social Work Placement (Addendum)
Clinical Social Work Department CLINICAL SOCIAL WORK PLACEMENT NOTE 06/08/2014  Patient:  [name], [name]  Account Number:  [id number] Admit date:  06/05/2014  Clinical Social Worker:  Domenica Reamer, CLINICAL SOCIAL WORKER  Date/time:  06/08/2014 10:38 AM  Clinical Social Work is seeking post-discharge placement for this patient at the following level of care:   [place]   (*CSW will update this form in Epic as items are completed)   06/08/2014  Patient/family provided with [place] Department of Clinical Social Work's list of facilities offering this level of care within the geographic area requested by the patient (or if unable, by the patient's family).  06/08/2014  Patient/family informed of their freedom to choose among providers that offer the needed level of care, that participate in Medicare, Medicaid or managed care program needed by the patient, have an available bed and are willing to accept the patient.  06/08/2014  Patient/family informed of [facility]' ownership interest in [facility], as well as of the fact that they are under no obligation to receive care at this facility.  PASARR submitted to EDS on 06/08/2014 PASARR number received on 06/08/2014  FL2 transmitted to all facilities in geographic area requested by pt/family on  06/08/2014 FL2 transmitted to all facilities within larger geographic area on   Patient informed that his/her managed care company has contracts with or will negotiate with  certain facilities, including the following:     Patient/family informed of bed offers received:  06/11/2014 Patient chooses bed at [facility], SNF Physician recommends and patient chooses bed at  n/a  Patient to be transferred to  [facility], SNF on  06/11/2014 Patient to be transferred to facility by PTAR- RN to call  Patient and family notified of transfer on 06/11/2014 Name of family member notified:  Lourenda, wife (at bedside)  The following  physician request were entered in Epic:   Additional Comments:   Domenica Reamer, [place] Worker [phone]

## 2014-06-08 NOTE — Clinical Social Work Psychosocial (Signed)
Clinical Social Work Department BRIEF PSYCHOSOCIAL ASSESSMENT 06/08/2014  Patient:  [name], [name]     Account Number:  [id number]     Admit date:  06/05/2014  Clinical Social Worker:  Domenica Reamer, [place]  Date/Time:  06/08/2014 10:05 AM  Referred by:  Physician  Date Referred:  06/08/2014 Referred for  SNF Placement   Other Referral:   none at this time   Interview type:  Patient Other interview type:   Also spoke with the wife who was in the patient's room ([place])    PSYCHOSOCIAL DATA Living Status:  WIFE Admitted from facility:   Level of care:   Primary support name:  [name] Primary support relationship to patient:  SPOUSE Degree of support available:   Patient expressed having a high level of support.  Wife ([name]) was in the room at the time of CSW interview and was very involved in decision making.    CURRENT CONCERNS Current Concerns  Post-Acute Placement   Other Concerns:   none at this time    SOCIAL WORK ASSESSMENT / PLAN CSW spoke with patient and patient's wife about physician recommendation for CIR.  CSW explained that we usually look at SNF for a CIR back-up.  Patient and wife were agreeable to SNF backup if patient could go to [facility] in [place] otherwise they are interested in home health. CSW informed RN case manager of patients inclination towards home health if [facility] is not available.  CSW will continue to follow for discharge needs.   Assessment/plan status:  Psychosocial Support/Ongoing Assessment of Needs Other assessment/ plan:   FL2, PASAR   Information/referral to community resources:   [facility]    PATIENT'S/FAMILY'S RESPONSE TO PLAN OF CARE: Patient and patient's wife were agreeable to SNF placement at [facility] as a back up plan for CIR.  Patient's wife expressed concern about going to any other facilities, however, and said she would prefer at home care if the patient was not accepted to  [facility] or [facility] so that he would be more comfortable.       Domenica Reamer, [place] Social Worker [phone]

## 2014-06-08 NOTE — Progress Notes (Signed)
[facility] PROGRESS NOTE  [name] SWF:[id number] DOB: May 21, 1946 DOA: 06/05/2014 PCP: Glo Herring., MD  Assessment/Plan: 1. L Hip fracture -s/p L hip hemiarthroplasty 8/13  per Dr.Blackman -will use lovenox for DVT proph -PT eval, will need [facility]  2. Cardiac amyloidosis -followed closely by Cards at [facility] -stable, h/o RV failure -continue midodrine  3. ESRD on HD  -HD cath in L groin, limited access options -HD today for high K per Renal  4. Chronic hypotension -BP runs in 70-80s range, asymptomatic -continue midodrine  5. Anemia of chronic disease -stable  6. Hyperkalemia -should correct with HD today  DVt proph: lovenox resume post op  Code Status: full Code Family Communication: d/w family at bedside Disposition Plan: [facility]  Consultants:  Renal   Cards  Ortho  HPI/Subjective: Feels ok, no dyspnea/chest pain, awaiting PT  Objective: Filed Vitals:   06/08/14 1145  BP:   Pulse:   Temp:   Resp: 18    Intake/Output Summary (Last 24 hours) at 06/08/14 1204 Last data filed at 06/08/14 0800  Gross per 24 hour  Intake    670 ml  Output    100 ml  Net    570 ml   Filed Weights   06/05/14 2100 06/06/14 1830 06/06/14 2219  Weight: 69.7 kg (153 lb 10.6 oz) 68.9 kg (151 lb 14.4 oz) 65.3 kg (143 lb 15.4 oz)    Exam:   General:  AAOx3, no distress  Cardiovascular: S1S2/RRR  Respiratory: CTAB  Abdomen: soft, mildly distended, BS present  Musculoskeletal: no edema c/c, HD catheter in L groin, did not mobilize L hip, dressing noted  Data Reviewed: Basic Metabolic Panel:  Recent Labs Lab 06/05/14 1424 06/06/14 1918 06/07/14 0710 06/08/14 0450  NA 141 134* 137 138  K 3.8 4.9 4.9 5.9*  CL 98 92* 97 97  CO2 21 22 24 22   GLUCOSE 79 98 92 100*  BUN 21 28* 19 28*  CREATININE 7.24* 9.27* 7.35* 8.72*  CALCIUM 8.2* 7.7* 7.9* 7.7*  PHOS  --  3.2  --   --    Liver Function Tests:  Recent Labs Lab 06/06/14 1918  ALBUMIN  3.1*   No results found for this basename: LIPASE, AMYLASE,  in the last 168 hours No results found for this basename: AMMONIA,  in the last 168 hours CBC:  Recent Labs Lab 06/05/14 1424 06/06/14 0526 06/07/14 0710 06/08/14 0450  WBC 5.9 5.2 7.4 6.3  NEUTROABS 4.3  --   --   --   HGB 9.5* 9.9* 10.0* 9.2*  HCT 28.4* 30.2* 30.5* 28.0*  MCV 99.0 100.0 96.8 100.0  PLT 200 207 195 188   Cardiac Enzymes: No results found for this basename: CKTOTAL, CKMB, CKMBINDEX, TROPONINI,  in the last 168 hours BNP (last 3 results)  Recent Labs  02/12/14 1139 05/09/14 1100  PROBNP 21129.0* 32097.0*   CBG: No results found for this basename: GLUCAP,  in the last 168 hours  Recent Results (from the past 240 hour(s))  SURGICAL PCR SCREEN     Status: None   Collection Time    06/07/14  8:30 AM      Result Value Ref Range Status   MRSA, PCR NEGATIVE  NEGATIVE Final   Staphylococcus aureus NEGATIVE  NEGATIVE Final   Comment:            The Xpert SA Assay (FDA     approved for NASAL specimens     in patients  over 67 years of age),     is one component of     a comprehensive surveillance     program.  Test performance has     been validated by [employer] for patients greater     than or equal to 38 year old.     It is not intended     to diagnose infection nor to     guide or monitor treatment.     Studies: Dg Pelvis Portable  06/07/2014   CLINICAL DATA:  LEFT hip hemiarthroplasty. Postoperative radiographs.  EXAM: PORTABLE PELVIS 1-2 VIEWS  COMPARISON:  None.  FINDINGS: New LEFT hip hemiarthroplasty. LEFT femoral dialysis catheter projects over the LEFT hip. Expected postsurgical changes in the soft tissues. There are no complicating features. The hemiarthroplasty appears located. Moderate RIGHT hip osteoarthritis incidentally noted.  IMPRESSION: Uncomplicated new LEFT total hip arthroplasty.   Electronically Signed   By: Dereck Ligas M.D.   On: 06/07/2014 20:25    Scheduled  Meds: . allopurinol  100 mg Oral Daily  . aspirin  81 mg Oral Daily  . cinacalcet  90 mg Oral Q supper  . darbepoetin  25 mcg Subcutaneous Q Fri-HD  . docusate sodium  100 mg Oral BID  . doxercalciferol  1 mcg Intravenous Q M,W,F-HD  . enoxaparin (LOVENOX) injection  30 mg Subcutaneous Q24H  . heparin  5,000 Units Dialysis Once in dialysis  . metoCLOPramide  20 mg Oral TID AC  . midodrine  10 mg Oral TID WC  . sevelamer carbonate  3,200 mg Oral TID WC  . sodium chloride  3 mL Intravenous Q12H  . sodium chloride  3 mL Intravenous Q12H  . venlafaxine XR  75 mg Oral Q breakfast   Continuous Infusions: . sodium chloride 50 mL/hr at 06/07/14 2109   Antibiotics Given (last 72 hours)   Date/Time Action Medication Dose Rate   06/07/14 2115 Given   ceFAZolin (ANCEF) IVPB 2 g/50 mL premix 2 g 100 mL/hr      Principal Problem:   Subcapital fracture of femur Active Problems:   ESRD (end stage renal disease)   Amyloid heart disease   Chronic diastolic congestive heart failure, NYHA class 2   Closed left hip fracture    Time spent:62min    [facility]  [facility] Pager [phone]. If 7PM-7AM, please contact night-coverage at [email or web address], password [facility] 06/08/2014, 12:04 PM  LOS: 3 days

## 2014-06-08 NOTE — Op Note (Signed)
NAMEAQUIL, DUHE NO.:  [id number]  MEDICAL RECORD NO.:  [id number]  LOCATION:  [id number]                        FACILITY:  [facility]  PHYSICIAN:  Lind Guest. Ninfa Linden, M.D.DATE OF BIRTH:  07/30/1946  DATE OF PROCEDURE:  06/07/2014 DATE OF DISCHARGE:                              OPERATIVE REPORT   PREOPERATIVE DIAGNOSIS:  Displaced left hip femoral neck fracture.  POSTOPERATIVE DIAGNOSIS:  Displaced left hip femoral neck fracture.  PROCEDURE:  Left hip hemiarthroplasty.  IMPLANTS:  Teacher, adult education- Fit femoral component size 6, size 50 unipolar head with +0 taper spacer.  APPROACH:  Anterior lateral approach.  SURGEON:  Lind Guest. Ninfa Linden, MD  ASSISTANT:  Erskine Emery, PA-C  ANESTHESIA:  General.  ANTIBIOTICS:  2 g IV Ancef.  BLOOD LOSS:  100-150 mL.  COMPLICATIONS:  None.  INDICATIONS:  Mr. Rondinelli is a 68 year old gentleman, who has been on dialysis for the last 9 years.  He has complicated medical history of, I believe, amyloidosis involving his heart and significant heart disease as well.  He also had dialysis access through a femoral catheter going into his left leg.  He fell off a chair 2 days ago at home and sustained a left hip femoral neck fracture.  He was then transported to the [facility] Emergency Room, admitted to the Medicine Service.  He had a dialysis yesterday and has been cleared for surgery from a Cardiology and Medicine standpoint as well as Renal standpoint.  This is certainly high risk no matter what, given his multiple comorbidities.  However, he is a __________ ambulator, in significant amount of pain and the surgery is warranted.  I am going to perform an anterolateral approach in order to hopefully protect the catheters in his groin and we will prep that into the field as well.  PROCEDURE DESCRIPTION:  After informed consent was obtained, appropriate left hip was marked.  He was brought to the operating room  and placed supine on the operating table.  General anesthesia was then obtained and arterial line was placed in his left arm and he was turned into a lateral decubitus position with the left operative hip up and axillary roll in place and padding it down on operative left leg.  There was appropriate securing of his head and neck and his arms.  His left hip then had Tegaderm dressing placed on the anterior catheter and we prepped the entire leg down to the ankle with DuraPrep and sterile drapes and even sprayed Betadine paint on the Tegaderm.  Sterile stockinette was utilized as well.  A time-out was called and he was identified as correct patient, correct left hip.  I then made incision directly over the greater trochanter and carried this proximally and distally.  I dissected down to the tip of the greater trochanter and then opened up the iliotibial band.  I placed a Charnley retractor and proceeded with an anterolateral approach to the hip.  I took off a small sleeve of the gluteus medius and minimus tendon of the greater trochanter and reflected these anteriorly.  I opened up the hip capsule and found a large joint effusion.  There was a hematoma  consistent with his hip fracture.  I then was able to place Cobra retractors around the femoral neck and made my femoral neck cut with an oscillating saw, just proximal to the lesser trochanter.  I placed a corkscrew guide in the femoral head and removed the femoral head in its entirety and trialed for size 50 unipolar head.  We then flexed and externally rotated the hip off and the leg off the table and leg bag.  We used initiating reamer to open up the femoral canal and the canal finding guide and then a lateralizing reamer.  I then began broaching using the 2 Summit Basic Press-Fit broaching system from a size 1 up to a size 6, where the size 6 felt to be secure, we trialed a standard neck with a +0 taper spacer in a 50 head, reduced this  in the acetabulum.  His leg lengths were equal.  He was stable throughout its arc of motion.  We then dislocated the hip and removed the trial components.  I placed the real Summit Basic Press-Fit stem from DePuy size 6, the real size 50 unipolar head and a +0 tapered spacer.  Again we reduced this acetabulum and was stable and we copiously irrigated the soft tissue with normal saline solution and closed the joint capsule with interrupted #1 Ethibond suture.  I reapproximated the gluteus medius and minimus back to the greater trochanter with #1 Ethibond suture and oversewn this #1 Vicryl. I closed the iliotibial band with #1 Vicryl suture followed by 0 Vicryl in the deep tissue, 2-0 Vicryl in subcutaneous tissue, staples on the skin.  Aquacel dressing was then applied.  He was then turned back into supine position.  His leg lengths were equal.  His catheter was checked at least externally found to be intact, from his groin catheter.  He was awakened and extubated and taken to recovery room.  Of note, Erskine Emery, PA-C assisted in the entire case and his assistance was crucial __________ to facilitated this case.     Lind Guest. Ninfa Linden, M.D.     CYB/MEDQ  D:  06/07/2014  T:  06/08/2014  Job:  [id number]

## 2014-06-08 NOTE — Care Management Note (Signed)
CARE MANAGEMENT NOTE 06/08/2014  Patient:  [name], [name]   Account Number:  [id number]  Date Initiated:  06/08/2014  Documentation initiated by:  Ricki Miller  Subjective/Objective Assessment:   68 yr old male s/p left hip hemiarthroplasty. Pateint is on dialysis-Mon/Wed/Fri.     Action/Plan:   Patient and family want to check into CIR vs SNF vs Home Health. Case manager will continue to monitor. Will offer choice   Anticipated DC Date:  06/09/2014   Anticipated DC Plan:    In-house referral  Clinical Social Worker      DC Planning Services  CM consult      Choice offered to / List presented to:             Status of service:  In process, will continue to follow Medicare Important Message given?  YES (If response is "NO", the following Medicare IM given date fields will be blank) Date Medicare IM given:  06/08/2014 Medicare IM given by:  Ricki Miller Date Additional Medicare IM given:   Additional Medicare IM given by:    Discharge Disposition:    Per UR Regulation:  Reviewed for med. necessity/level of care/duration of stay  If discussed at [place] of Stay Meetings, dates discussed:    Comments:  06/08/14 1:15pm Ricki Miller, RN BSN Case Manager Case Manager informed Social Worker Barnett Applebaum that [facility] will not approve patient for CIR. Patient's wife wants [facility] for shortterm rehab. Case manager will continue to monitor.

## 2014-06-08 NOTE — Progress Notes (Signed)
Subjective: 1 Day Post-Op Procedure(s) (LRB): LEFT HIP HEMIARTHROPLASTY (Left) Patient reports pain as moderate.    Objective: Vital signs in last 24 hours: Temp:  [97.8 F (36.6 C)-99.5 F (37.5 C)] 98.5 F (36.9 C) (08/14 0526) Pulse Rate:  [78-98] 98 (08/14 0607) Resp:  [11-18] 18 (08/14 0526) BP: (72-118)/(15-66) 97/33 mmHg (08/14 0607) SpO2:  [96 %-100 %] 98 % (08/14 0526) Arterial Line BP: (94-116)/(46-71) 94/48 mmHg (08/13 2030)  Intake/Output from previous day: 08/13 0701 - 08/14 0700 In: 570 [P.O.:120; I.V.:200; IV Piggyback:250] Out: 100 [Blood:100] Intake/Output this shift: Total I/O In: 570 [P.O.:120; I.V.:200; IV Piggyback:250] Out: -    Recent Labs  06/05/14 1424 06/06/14 0526 06/07/14 0710 06/08/14 0450  HGB 9.5* 9.9* 10.0* 9.2*    Recent Labs  06/07/14 0710 06/08/14 0450  WBC 7.4 6.3  RBC 3.15* 2.80*  HCT 30.5* 28.0*  PLT 195 188    Recent Labs  06/07/14 0710 06/08/14 0450  NA 137 138  K 4.9 5.9*  CL 97 97  CO2 24 22  BUN 19 28*  CREATININE 7.35* 8.72*  GLUCOSE 92 100*  CALCIUM 7.9* 7.7*    Recent Labs  06/05/14 1424  INR 1.29    Sensation intact distally Intact pulses distally Dorsiflexion/Plantar flexion intact Incision: scant drainage  Assessment/Plan: 1 Day Post-Op Procedure(s) (LRB): LEFT HIP HEMIARTHROPLASTY (Left) Up with therapy, WBAT left hip; anterior hip precautions. Lovenox while in the hospital and home on aspirin unless otherwise indicated by Cardiology  [facility] Y 06/08/2014, 6:56 AM

## 2014-06-08 NOTE — Progress Notes (Signed)
Pt had a 10 beat run of V tach at this time. Notified cardiology Christell Faith. No new orders at this time. Due to Pts low BP adding a beta blocker is not an option at this time. Pt is on tele and will continue to monitor Pts rhythm.

## 2014-06-09 LAB — CBC
HCT: 27.8 % — ABNORMAL LOW (ref 39.0–52.0)
Hemoglobin: 9.1 g/dL — ABNORMAL LOW (ref 13.0–17.0)
MCH: 32.6 pg (ref 26.0–34.0)
MCHC: 32.7 g/dL (ref 30.0–36.0)
MCV: 99.6 fL (ref 78.0–100.0)
PLATELETS: 195 10*3/uL (ref 150–400)
RBC: 2.79 MIL/uL — ABNORMAL LOW (ref 4.22–5.81)
RDW: 14.4 % (ref 11.5–15.5)
WBC: 5.9 10*3/uL (ref 4.0–10.5)

## 2014-06-09 LAB — BASIC METABOLIC PANEL
Anion gap: 17 — ABNORMAL HIGH (ref 5–15)
BUN: 20 mg/dL (ref 6–23)
CHLORIDE: 97 meq/L (ref 96–112)
CO2: 24 mEq/L (ref 19–32)
Calcium: 9 mg/dL (ref 8.4–10.5)
Creatinine, Ser: 5.8 mg/dL — ABNORMAL HIGH (ref 0.50–1.35)
GFR calc Af Amer: 10 mL/min — ABNORMAL LOW (ref >90)
GFR calc non Af Amer: 9 mL/min — ABNORMAL LOW (ref >90)
Glucose, Bld: 92 mg/dL (ref 70–99)
Potassium: 4.9 mEq/L (ref 3.7–5.3)
Sodium: 138 mEq/L (ref 137–147)

## 2014-06-09 MED ORDER — POLYETHYLENE GLYCOL 3350 17 G PO PACK
17.0000 g | PACK | Freq: Two times a day (BID) | ORAL | Status: DC
  Administered 2014-06-09 – 2014-06-10 (×4): 17 g via ORAL
  Filled 2014-06-09 (×6): qty 1

## 2014-06-09 MED ORDER — SENNOSIDES-DOCUSATE SODIUM 8.6-50 MG PO TABS
1.0000 | ORAL_TABLET | Freq: Two times a day (BID) | ORAL | Status: DC
  Administered 2014-06-09 – 2014-06-10 (×4): 1 via ORAL
  Filled 2014-06-09: qty 1

## 2014-06-09 MED ORDER — WHITE PETROLATUM GEL
Status: AC
  Administered 2014-06-09: 10:00:00
  Filled 2014-06-09: qty 5

## 2014-06-09 NOTE — Progress Notes (Signed)
Patient ID: [name], male   DOB: [birth year], 68 y.o.   MRN: [id number] Status post hemiarthroplasty for subcapital femur fracture. Patient is making slow progress with therapy and will need discharge to skilled nursing. Patient has no complaints this morning.

## 2014-06-09 NOTE — Progress Notes (Signed)
[facility] PROGRESS NOTE  [name] WUJ:[id number] DOB: [birth year] DOA: 06/05/2014 PCP: Glo Herring., MD  Assessment/Plan: 1. L Hip fracture -s/p L hip hemiarthroplasty 8/13  per Dr.Blackman -lovenox for DVT proph -PT following -declined by CIR, SNF Monday  2. Cardiac amyloidosis -followed closely by Cards at [facility] -stable, h/o RV failure -continue midodrine  3. ESRD on HD  -HD cath in L groin, limited access options -HD  per Renal  4. Chronic hypotension -BP runs in 70-80s range, asymptomatic -continue midodrine  5. Anemia of chronic disease -stable  6. Hyperkalemia -corrected with HD   7. Constipation -scheduled laxatives and stool softeners  DVt proph: lovenox   Code Status: full Code Family Communication: d/w family at bedside Disposition Plan: ST rehab Monday  Consultants:  Renal   Cards  Ortho  HPI/Subjective: Feels ok, no dyspnea/chest pain, awaiting PT  Objective: Filed Vitals:   06/09/14 0521  BP:   Pulse: 89  Temp: 99 F (37.2 C)  Resp:     Intake/Output Summary (Last 24 hours) at 06/09/14 1120 Last data filed at 06/08/14 2117  Gross per 24 hour  Intake    240 ml  Output   1095 ml  Net   -855 ml   Filed Weights   06/06/14 2219 06/08/14 1759 06/08/14 2117  Weight: 65.3 kg (143 lb 15.4 oz) 68.7 kg (151 lb 7.3 oz) 67.6 kg (149 lb 0.5 oz)    Exam:   General:  AAOx3, no distress  Cardiovascular: S1S2/RRR  Respiratory: CTAB  Abdomen: soft, mildly distended, BS present  Musculoskeletal: no edema c/c, HD catheter in L groin, did not mobilize L hip, dressing noted  Data Reviewed: Basic Metabolic Panel:  Recent Labs Lab 06/05/14 1424 06/06/14 1918 06/07/14 0710 06/08/14 0450 06/09/14 0508  NA 141 134* 137 138 138  K 3.8 4.9 4.9 5.9* 4.9  CL 98 92* 97 97 97  CO2 21 22 24 22 24   GLUCOSE 79 98 92 100* 92  BUN 21 28* 19 28* 20  CREATININE 7.24* 9.27* 7.35* 8.72* 5.80*  CALCIUM 8.2* 7.7* 7.9* 7.7* 9.0   PHOS  --  3.2  --   --   --    Liver Function Tests:  Recent Labs Lab 06/06/14 1918  ALBUMIN 3.1*   No results found for this basename: LIPASE, AMYLASE,  in the last 168 hours No results found for this basename: AMMONIA,  in the last 168 hours CBC:  Recent Labs Lab 06/05/14 1424 06/06/14 0526 06/07/14 0710 06/08/14 0450 06/09/14 0508  WBC 5.9 5.2 7.4 6.3 5.9  NEUTROABS 4.3  --   --   --   --   HGB 9.5* 9.9* 10.0* 9.2* 9.1*  HCT 28.4* 30.2* 30.5* 28.0* 27.8*  MCV 99.0 100.0 96.8 100.0 99.6  PLT 200 207 195 188 195   Cardiac Enzymes: No results found for this basename: CKTOTAL, CKMB, CKMBINDEX, TROPONINI,  in the last 168 hours BNP (last 3 results)  Recent Labs  02/12/14 1139 05/09/14 1100  PROBNP 21129.0* 32097.0*   CBG: No results found for this basename: GLUCAP,  in the last 168 hours  Recent Results (from the past 240 hour(s))  SURGICAL PCR SCREEN     Status: None   Collection Time    06/07/14  8:30 AM      Result Value Ref Range Status   MRSA, PCR NEGATIVE  NEGATIVE Final   Staphylococcus aureus NEGATIVE  NEGATIVE Final   Comment:  The Xpert SA Assay (FDA     approved for NASAL specimens     in patients over 95 years of age),     is one component of     a comprehensive surveillance     program.  Test performance has     been validated by [employer] for patients greater     than or equal to 26 year old.     It is not intended     to diagnose infection nor to     guide or monitor treatment.     Studies: Dg Pelvis Portable  06/07/2014   CLINICAL DATA:  LEFT hip hemiarthroplasty. Postoperative radiographs.  EXAM: PORTABLE PELVIS 1-2 VIEWS  COMPARISON:  None.  FINDINGS: New LEFT hip hemiarthroplasty. LEFT femoral dialysis catheter projects over the LEFT hip. Expected postsurgical changes in the soft tissues. There are no complicating features. The hemiarthroplasty appears located. Moderate RIGHT hip osteoarthritis incidentally noted.   IMPRESSION: Uncomplicated new LEFT total hip arthroplasty.   Electronically Signed   By: Dereck Ligas M.D.   On: 06/07/2014 20:25    Scheduled Meds: . allopurinol  100 mg Oral Daily  . aspirin  81 mg Oral Daily  . cinacalcet  90 mg Oral Q supper  . darbepoetin  25 mcg Subcutaneous Q Fri-HD  . doxercalciferol  1 mcg Intravenous Q M,W,F-HD  . enoxaparin (LOVENOX) injection  30 mg Subcutaneous Q24H  . metoCLOPramide  20 mg Oral TID AC  . midodrine  10 mg Oral TID WC  . polyethylene glycol  17 g Oral BID  . senna-docusate  1 tablet Oral BID  . sevelamer carbonate  3,200 mg Oral TID WC  . sodium chloride  3 mL Intravenous Q12H  . sodium chloride  3 mL Intravenous Q12H  . venlafaxine XR  75 mg Oral Q breakfast  . white petrolatum       Continuous Infusions: . sodium chloride 50 mL/hr at 06/07/14 2109   Antibiotics Given (last 72 hours)   Date/Time Action Medication Dose Rate   06/07/14 2115 Given   ceFAZolin (ANCEF) IVPB 2 g/50 mL premix 2 g 100 mL/hr      Principal Problem:   Subcapital fracture of femur Active Problems:   ESRD (end stage renal disease)   Amyloid heart disease   Chronic diastolic congestive heart failure, NYHA class 2   Closed left hip fracture    Time spent:6min    [facility]  [facility] Pager [phone]. If 7PM-7AM, please contact night-coverage at [email or web address], password [facility] 06/09/2014, 11:20 AM  LOS: 4 days

## 2014-06-09 NOTE — Evaluation (Signed)
Occupational Therapy Evaluation Patient Details Name: [name] MRN: [id number] DOB: [birth year] Today's Date: 06/09/2014    History of Present Illness pt presents after a fall resulting in L Femur fx.  pt now post THA.     Clinical Impression   This 68 yo male admitted and underwent above presents to acute OT with decreased balance, decreased mobility, increased pain, decreased ROM LLE all affecting pt's ability to care for himself and increasing burden of care on caregivers. Pt will benefit from continued OT at SNF to get to a S level or better to return home with wife.    Follow Up Recommendations  SNF    Equipment Recommendations  3 in 1 bedside comode       Precautions / Restrictions Precautions Precautions: Anterior Hip;Fall Restrictions Weight Bearing Restrictions: No LLE Weight Bearing: Weight bearing as tolerated      Mobility Bed Mobility Overal bed mobility: Needs Assistance Bed Mobility: Sit to Supine       Sit to supine: Mod assist      Transfers Overall transfer level: Needs assistance Equipment used: Rolling walker (2 wheeled) Transfers: Sit to/from Stand Sit to Stand: Min assist         General transfer comment: cues to get clsoer to recliner prior to sitting.      Balance Overall balance assessment: Needs assistance Sitting-balance support: Bilateral upper extremity supported;Feet supported Sitting balance-Leahy Scale: Poor     Standing balance support: Bilateral upper extremity supported Standing balance-Leahy Scale: Poor                              ADL Overall ADL's : Needs assistance/impaired Eating/Feeding: Independent;Sitting   Grooming: Set up;Sitting   Upper Body Bathing: Set up;Sitting   Lower Body Bathing: Maximal assistance;Sit to/from stand   Upper Body Dressing : Set up;Sitting   Lower Body Dressing: Total assistance;Sit to/from stand   Toilet Transfer: Minimal assistance;Ambulation;RW (recliner to  bed )   Toileting- Clothing Manipulation and Hygiene: Minimal assistance;Sit to/from stand                         Pertinent Vitals/Pain Pain Assessment: 0-10 Pain Score: 8  Pain Location: Left hip Pain Descriptors / Indicators: Aching Pain Intervention(s): Repositioned;Ice applied     Hand Dominance Right   Extremity/Trunk Assessment Upper Extremity Assessment Upper Extremity Assessment: Overall WFL for tasks assessed           Communication Communication Communication: No difficulties   Cognition Arousal/Alertness: Awake/alert Behavior During Therapy: WFL for tasks assessed/performed Overall Cognitive Status: Within Functional Limits for tasks assessed                                Home Living Family/patient expects to be discharged to:: Private residence Living Arrangements: Spouse/significant other Available Help at Discharge: Family;Available 24 hours/day Type of Home: House Home Access: Stairs to enter [employer] of Steps: 2 Entrance Stairs-Rails: Right;Left;Can reach both Home Layout: One level               Home Equipment: Walker - 2 wheels;Walker - 4 wheels          Prior Functioning/Environment Level of Independence: Independent             OT Diagnosis: Generalized weakness;Acute pain   OT Problem List: Decreased strength;Decreased range of  motion;Decreased activity tolerance;Impaired balance (sitting and/or standing);Pain;Decreased knowledge of use of DME or AE;Decreased knowledge of precautions      OT Goals(Current goals can be found in the care plan section) Acute Rehab OT Goals Patient Stated Goal: rehab at [facility] then home  OT Frequency:                End of [place] During Treatment: Gait belt;Rolling walker Nurse Communication: Patient requests pain meds (nurse reports pt had pain meds 45 minutes before I was in to work with him)  Activity Tolerance: Patient  limited by pain Patient left: in bed;with call bell/phone within reach;with family/visitor present;with bed alarm set   Time: 3491-7915 OT Time Calculation (min): 20 min Charges:  OT General Charges $OT Visit: 1 Procedure OT Evaluation $Initial OT Evaluation Tier I: 1 Procedure OT Treatments $Self Care/Home Management : 8-22 mins   Almon Register [phone] 06/09/2014, 3:46 PM

## 2014-06-09 NOTE — Progress Notes (Signed)
Physical Therapy Treatment Patient Details Name: [name] MRN: [id number] DOB: 01-03-1946 Today's Date: 06/09/2014    History of Present Illness pt presents after a fall resulting in L Femur fx.  pt now post THA.      PT Comments    Pt able to increase amb distance today.  Pt and family indicate that they would like to D/C to home instead of going to a rehab.  Daughter indicates her and siblings will plan to take turns providing assist to pt and wife.  Will continue to follow.    Follow Up Recommendations  Home health PT;Supervision/Assistance - 24 hour     Equipment Recommendations  3in1 (PT)    Recommendations for Other Services       Precautions / Restrictions Precautions Precautions: Fall Restrictions Weight Bearing Restrictions: Yes LLE Weight Bearing: Weight bearing as tolerated    Mobility  Bed Mobility                  Transfers Overall transfer level: Needs assistance Equipment used: Rolling walker (2 wheeled) Transfers: Sit to/from Stand Sit to Stand: Min assist         General transfer comment: cues to get clsoer to recliner prior to sitting.    Ambulation/Gait Ambulation/Gait assistance: Min guard Ambulation Distance (Feet): 80 Feet Assistive device: Rolling walker (2 wheeled) Gait Pattern/deviations: Step-to pattern;Decreased step length - right;Decreased stance time - left;Decreased stride length   Gait velocity interpretation: Below normal speed for age/gender General Gait Details: cues for positioning within RW, upright posture.  pt ale to increase amb distance today, though continues to move slowly.     Stairs            Wheelchair Mobility    Modified Rankin (Stroke Patients Only)       Balance Overall balance assessment: Needs assistance Sitting-balance support: No upper extremity supported;Feet supported Sitting balance-Leahy Scale: Fair     Standing balance support: Bilateral upper extremity supported Standing  balance-Leahy Scale: Poor                      Cognition Arousal/Alertness: Awake/alert Behavior During Therapy: WFL for tasks assessed/performed Overall Cognitive Status: Within Functional Limits for tasks assessed                      Exercises      General Comments        Pertinent Vitals/Pain Pain Assessment: 0-10 Pain Score: 7  Pain Location: L hip Pain Descriptors / Indicators: Sore Pain Intervention(s): Premedicated before session;Repositioned    Home Living                      Prior Function            PT Goals (current goals can now be found in the care plan section) Acute Rehab PT Goals Patient Stated Goal: Walk normal PT Goal Formulation: With patient Time For Goal Achievement: 06/15/14 Potential to Achieve Goals: Good Progress towards PT goals: Progressing toward goals    Frequency  Min 5X/week    PT Plan Discharge plan needs to be updated    Co-evaluation             End of Session Equipment Utilized During Treatment: Gait belt Activity Tolerance: Patient tolerated treatment well Patient left: in chair;with call bell/phone within reach;with family/visitor present     Time: 5809-9833 PT Time Calculation (min): 23 min  Charges:  $  Gait Training: 23-37 mins                    G CodesCatarina [name], [name] 06/09/2014, 12:08 PM

## 2014-06-09 NOTE — Progress Notes (Signed)
[facility] Progress Note   Subjective: No complaints, did not tolerate UF last night on HD with tachycardia  Filed Vitals:   06/08/14 2117 06/08/14 2348 06/09/14 0400 06/09/14 0521  BP: 87/46     Pulse: 88   89  Temp: 98.1 F (36.7 C)   99 F (37.2 C)  TempSrc:    Oral  Resp: 17 17 17    Height:      Weight: 67.6 kg (149 lb 0.5 oz)     SpO2: 98% 98% 93% 93%   Exam: Alert, no distress + jvd  Chest is clear bilat  RRR no MRG  Abd less tense today, soft, mildly distended, nontender, liver down 4 cm No LE or UE edema  L groin tunneled HD cath w clean exit site  Neuro is nf, ox 3  HD: MWF [facility]  4h 350/600 2K/2.5 Ca Bath 71kg (lower now < 66-67 kg) L fem HD cath  Heparin 2000 then 1500 u/hour Max UF 1.5L/ hr  EPO 4000 TIW, Hectorol 1ug TIW   Assessment:  1 Fall / left hip fracture- s/p ORIF 8/14 2 ESRD on HD since 2006 3 Hyperkalemia- better 4 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, baseline SBP 70's - 80's 5 Volume excess -resolved, wt down, needs lower dry wt 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 6 Anemia cont ESA , Hb 9's 7 HPTH cont renvela, sensipar and vit D 8 Recent HD cath removal (R chest)- done at [facility] for refractory culture-neg fevers  9 Dispo- getting OOB today   Plan- HD Monday    Kelly Splinter MD  pager [phone]    cell [phone]  06/09/2014, 8:43 AM     Recent Labs Lab 06/06/14 1918 06/07/14 0710 06/08/14 0450 06/09/14 0508  NA 134* 137 138 138  K 4.9 4.9 5.9* 4.9  CL 92* 97 97 97  CO2 22 24 22 24   GLUCOSE 98 92 100* 92  BUN 28* 19 28* 20  CREATININE 9.27* 7.35* 8.72* 5.80*  CALCIUM 7.7* 7.9* 7.7* 9.0  PHOS 3.2  --   --   --     Recent Labs Lab 06/06/14 1918  ALBUMIN 3.1*    Recent Labs Lab 06/05/14 1424  06/07/14 0710 06/08/14 0450 06/09/14 0508  WBC 5.9  < > 7.4 6.3 5.9  NEUTROABS 4.3  --   --   --   --   HGB 9.5*  < > 10.0* 9.2* 9.1*  HCT 28.4*  < > 30.5* 28.0* 27.8*  MCV 99.0   < > 96.8 100.0 99.6  PLT 200  < > 195 188 195  < > = values in this interval not displayed. Marland Kitchen allopurinol  100 mg Oral Daily  . aspirin  81 mg Oral Daily  . cinacalcet  90 mg Oral Q supper  . darbepoetin  25 mcg Subcutaneous Q Fri-HD  . docusate sodium  100 mg Oral BID  . doxercalciferol  1 mcg Intravenous Q M,W,F-HD  . enoxaparin (LOVENOX) injection  30 mg Subcutaneous Q24H  . metoCLOPramide  20 mg Oral TID AC  . midodrine  10 mg Oral TID WC  . sevelamer carbonate  3,200 mg Oral TID WC  . sodium chloride  3 mL Intravenous Q12H  . sodium chloride  3 mL Intravenous Q12H  . venlafaxine XR  75 mg Oral Q breakfast   . sodium chloride 50 mL/hr at 06/07/14 2109   sodium chloride, acetaminophen, acetaminophen, HYDROcodone-acetaminophen, HYDROcodone-acetaminophen, HYDROmorphone (  DILAUDID) injection, menthol-cetylpyridinium, methocarbamol (ROBAXIN) IV, methocarbamol, metoCLOPramide (REGLAN) injection, metoCLOPramide, morphine injection, ondansetron (ZOFRAN) IV, ondansetron (ZOFRAN) IV, ondansetron, ondansetron, phenol, polyethylene glycol, senna-docusate, sevelamer carbonate, sodium chloride

## 2014-06-10 MED ORDER — BISACODYL 10 MG RE SUPP
10.0000 mg | Freq: Once | RECTAL | Status: AC
  Administered 2014-06-10: 10 mg via RECTAL
  Filled 2014-06-10: qty 1

## 2014-06-10 NOTE — Progress Notes (Signed)
Physical Therapy Treatment Patient Details Name: [name] MRN: [id number] DOB: 04-18-1946 Today's Date: 06/10/2014    History of Present Illness pt presents after a fall resulting in L Femur fx.  pt now post THA.      PT Comments    Pt progressing well with therapy. Able to increase ambulation distance today and is progressing towards supervision (A). Patient needs to practice stairs next session prior to D/C home with family.   Follow Up Recommendations  Home health PT;Supervision/Assistance - 24 hour     Equipment Recommendations  3in1 (PT)    Recommendations for Other Services       Precautions / Restrictions Precautions Precautions: Anterior Hip;Fall Restrictions Weight Bearing Restrictions: Yes LLE Weight Bearing: Weight bearing as tolerated    Mobility  Bed Mobility               General bed mobility comments: not addressed; pt up in chair and returned to chair   Transfers Overall transfer level: Needs assistance Equipment used: Rolling walker (2 wheeled) Transfers: Sit to/from Stand Sit to Stand: Min guard         General transfer comment: cues for hand placement and safety with RW  Ambulation/Gait Ambulation/Gait assistance: Min guard;Supervision Ambulation Distance (Feet): 150 Feet Assistive device: Rolling walker (2 wheeled) Gait Pattern/deviations: Step-through pattern;Decreased stride length;Decreased stance time - left;Decreased step length - right;Antalgic;Trunk flexed;Narrow base of support Gait velocity: decr due to pain Gait velocity interpretation: Below normal speed for age/gender General Gait Details: cues to progress to step through and pt was able to until he became fatigued then began to ambulate with antalgic gt; cues for widen BOS for balance and upright posture; progressing towards supervision    Stairs            Wheelchair Mobility    Modified Rankin (Stroke Patients Only)       Balance Overall balance  assessment: Needs assistance;History of Falls         Standing balance support: During functional activity;Bilateral upper extremity supported Standing balance-Leahy Scale: Poor Standing balance comment: relies on RW for bil UEsupport                    Cognition Arousal/Alertness: Awake/alert Behavior During Therapy: WFL for tasks assessed/performed Overall Cognitive Status: Within Functional Limits for tasks assessed                      Exercises General Exercises - Lower Extremity Ankle Circles/Pumps: AROM;Both;15 reps;Seated Quad Sets: AROM;Strengthening;5 reps;10 reps;Seated Gluteal Sets: 10 reps Long Arc Quad: AROM;10 reps;Both;Seated Hip ABduction/ADduction: AROM;Left;Strengthening;Seated    General Comments        Pertinent Vitals/Pain Pain Assessment: 0-10 Pain Score: 8  Pain Location: Lt hip Pain Descriptors / Indicators: Sharp;Shooting Pain Intervention(s): Limited activity within patient's tolerance;Monitored during session;Repositioned    Home Living                      Prior Function            PT Goals (current goals can now be found in the care plan section) Acute Rehab PT Goals Patient Stated Goal: go home tomorrow or tuesday PT Goal Formulation: With patient Time For Goal Achievement: 06/15/14 Potential to Achieve Goals: Good Progress towards PT goals: Progressing toward goals    Frequency  Min 5X/week    PT Plan Current plan remains appropriate    Co-evaluation  End of Session Equipment Utilized During Treatment: Gait belt Activity Tolerance: Patient tolerated treatment well Patient left: in chair;with call bell/phone within reach;with family/visitor present     Time: 0940-1004 PT Time Calculation (min): 24 min  Charges:  $Gait Training: 8-22 mins $Therapeutic Exercise: 8-22 mins                    G CodesGustavus Bryant, [place] 06/10/2014, 11:54 AM

## 2014-06-10 NOTE — Progress Notes (Signed)
[facility] PROGRESS NOTE  [name] WIO:[id number] DOB: 05/31/1946 DOA: 06/05/2014 PCP: Glo Herring., MD  Assessment/Plan: 1. L Hip fracture -s/p L hip hemiarthroplasty 8/13  per Dr.Blackman -continue lovenox for DVT proph -PT following -declined by CIR, SNF Monday  2. Cardiac amyloidosis -followed closely by Cards at [facility] -stable, h/o RV failure -continue midodrine  3. ESRD on HD  -HD cath in L groin, limited access options -HD  per Renal  4. Chronic hypotension -BP runs in 70-80s range, asymptomatic -continue midodrine  5. Anemia of chronic disease -stable  6. Hyperkalemia -corrected with HD   7. Constipation -scheduled laxatives and stool softeners -add dulcolax suppository today  DVt proph: lovenox   Code Status: full Code Family Communication: d/w family at bedside Disposition Plan: ST rehab Monday  Consultants:  Renal   Cards  Ortho  HPI/Subjective: Feels ok, no dyspnea,  no BM yet  Objective: Filed Vitals:   06/10/14 0705  BP: 84/54  Pulse: 87  Temp: 98.4 F (36.9 C)  Resp: 16    Intake/Output Summary (Last 24 hours) at 06/10/14 1048 Last data filed at 06/09/14 2200  Gross per 24 hour  Intake    780 ml  Output      0 ml  Net    780 ml   Filed Weights   06/06/14 2219 06/08/14 1759 06/08/14 2117  Weight: 65.3 kg (143 lb 15.4 oz) 68.7 kg (151 lb 7.3 oz) 67.6 kg (149 lb 0.5 oz)    Exam:   General:  AAOx3, no distress  Cardiovascular: S1S2/RRR  Respiratory: CTAB  Abdomen: soft, mildly distended, BS present  Musculoskeletal: no edema c/c, HD catheter in L groin, did not mobilize L hip, dressing noted  Data Reviewed: Basic Metabolic Panel:  Recent Labs Lab 06/05/14 1424 06/06/14 1918 06/07/14 0710 06/08/14 0450 06/09/14 0508  NA 141 134* 137 138 138  K 3.8 4.9 4.9 5.9* 4.9  CL 98 92* 97 97 97  CO2 21 22 24 22 24   GLUCOSE 79 98 92 100* 92  BUN 21 28* 19 28* 20  CREATININE 7.24* 9.27* 7.35* 8.72* 5.80*   CALCIUM 8.2* 7.7* 7.9* 7.7* 9.0  PHOS  --  3.2  --   --   --    Liver Function Tests:  Recent Labs Lab 06/06/14 1918  ALBUMIN 3.1*   No results found for this basename: LIPASE, AMYLASE,  in the last 168 hours No results found for this basename: AMMONIA,  in the last 168 hours CBC:  Recent Labs Lab 06/05/14 1424 06/06/14 0526 06/07/14 0710 06/08/14 0450 06/09/14 0508  WBC 5.9 5.2 7.4 6.3 5.9  NEUTROABS 4.3  --   --   --   --   HGB 9.5* 9.9* 10.0* 9.2* 9.1*  HCT 28.4* 30.2* 30.5* 28.0* 27.8*  MCV 99.0 100.0 96.8 100.0 99.6  PLT 200 207 195 188 195   Cardiac Enzymes: No results found for this basename: CKTOTAL, CKMB, CKMBINDEX, TROPONINI,  in the last 168 hours BNP (last 3 results)  Recent Labs  02/12/14 1139 05/09/14 1100  PROBNP 21129.0* 32097.0*   CBG: No results found for this basename: GLUCAP,  in the last 168 hours  Recent Results (from the past 240 hour(s))  SURGICAL PCR SCREEN     Status: None   Collection Time    06/07/14  8:30 AM      Result Value Ref Range Status   MRSA, PCR NEGATIVE  NEGATIVE Final   Staphylococcus aureus NEGATIVE  NEGATIVE Final   Comment:            The Xpert SA Assay (FDA     approved for NASAL specimens     in patients over 32 years of age),     is one component of     a comprehensive surveillance     program.  Test performance has     been validated by [employer] for patients greater     than or equal to 17 year old.     It is not intended     to diagnose infection nor to     guide or monitor treatment.     Studies: No results found.  Scheduled Meds: . allopurinol  100 mg Oral Daily  . aspirin  81 mg Oral Daily  . bisacodyl  10 mg Rectal Once  . cinacalcet  90 mg Oral Q supper  . darbepoetin  25 mcg Subcutaneous Q Fri-HD  . doxercalciferol  1 mcg Intravenous Q M,W,F-HD  . enoxaparin (LOVENOX) injection  30 mg Subcutaneous Q24H  . metoCLOPramide  20 mg Oral TID AC  . midodrine  10 mg Oral TID WC  .  polyethylene glycol  17 g Oral BID  . senna-docusate  1 tablet Oral BID  . sevelamer carbonate  3,200 mg Oral TID WC  . sodium chloride  3 mL Intravenous Q12H  . sodium chloride  3 mL Intravenous Q12H  . venlafaxine XR  75 mg Oral Q breakfast   Continuous Infusions: . sodium chloride 50 mL/hr at 06/07/14 2109   Antibiotics Given (last 72 hours)   Date/Time Action Medication Dose Rate   06/07/14 2115 Given   ceFAZolin (ANCEF) IVPB 2 g/50 mL premix 2 g 100 mL/hr      Principal Problem:   Subcapital fracture of femur Active Problems:   ESRD (end stage renal disease)   Amyloid heart disease   Chronic diastolic congestive heart failure, NYHA class 2   Closed left hip fracture    Time spent:30min    [facility]  [facility] Pager [phone]. If 7PM-7AM, please contact night-coverage at [email or web address], password [facility] 06/10/2014, 10:48 AM  LOS: 5 days

## 2014-06-10 NOTE — Progress Notes (Signed)
AT 0425 pt was sleeping and had a run of 12 PVC's. Pt reverted back to sinus rhythm. Continues to have mutlifocal PVC's occasionally.

## 2014-06-10 NOTE — Progress Notes (Signed)
[facility] Progress Note   Subjective: no complaints, up in chair  Filed Vitals:   06/09/14 2045 06/10/14 0000 06/10/14 0400 06/10/14 0705  BP: 95/54   84/54  Pulse: 94   87  Temp: 99.1 F (37.3 C)   98.4 F (36.9 C)  TempSrc: Oral   Oral  Resp: 16 20 20 16   Height:      Weight:      SpO2: 100% 100% 100% 97%   Exam: Alert, no distress + jvd  Chest is clear bilat  RRR no MRG  Abd less tense today, soft, mildly distended, nontender, liver down 4 cm No LE or UE edema  L groin tunneled HD cath w clean exit site  Neuro is nf, ox 3  HD: MWF [facility]  4h 350/600 2K/2.5 Ca [place] 71kg (lower now < 66-67 kg) L fem HD cath  Heparin 2000 then 1500 u/hour Max UF 1.5L/ hr  EPO 4000 TIW, Hectorol 1ug TIW   Assessment:  1 Fall / left hip fracture- s/p ORIF 8/14 2 ESRD on HD since 2006 3 Hyperkalemia- better 4 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, baseline SBP 70's - 80's 5 Volume excess -resolved, wt down, needs lower dry wt 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 6 Anemia cont ESA , Hb 9's 7 HPTH cont renvela, sensipar and vit D 8 Recent HD cath removal (R chest)- done at [facility] for refractory culture-neg fevers  9 Dispo- probable d/c to SNF   Plan- HD Monday    Kelly Splinter MD  pager [phone]    cell [phone]  06/10/2014, 2:02 PM     Recent Labs Lab 06/06/14 1918 06/07/14 0710 06/08/14 0450 06/09/14 0508  NA 134* 137 138 138  K 4.9 4.9 5.9* 4.9  CL 92* 97 97 97  CO2 22 24 22 24   GLUCOSE 98 92 100* 92  BUN 28* 19 28* 20  CREATININE 9.27* 7.35* 8.72* 5.80*  CALCIUM 7.7* 7.9* 7.7* 9.0  PHOS 3.2  --   --   --     Recent Labs Lab 06/06/14 1918  ALBUMIN 3.1*    Recent Labs Lab 06/05/14 1424  06/07/14 0710 06/08/14 0450 06/09/14 0508  WBC 5.9  < > 7.4 6.3 5.9  NEUTROABS 4.3  --   --   --   --   HGB 9.5*  < > 10.0* 9.2* 9.1*  HCT 28.4*  < > 30.5* 28.0* 27.8*  MCV 99.0  < > 96.8 100.0 99.6  PLT 200  < > 195 188 195   < > = values in this interval not displayed. Marland Kitchen allopurinol  100 mg Oral Daily  . aspirin  81 mg Oral Daily  . bisacodyl  10 mg Rectal Once  . cinacalcet  90 mg Oral Q supper  . darbepoetin  25 mcg Subcutaneous Q Fri-HD  . doxercalciferol  1 mcg Intravenous Q M,W,F-HD  . enoxaparin (LOVENOX) injection  30 mg Subcutaneous Q24H  . metoCLOPramide  20 mg Oral TID AC  . midodrine  10 mg Oral TID WC  . polyethylene glycol  17 g Oral BID  . senna-docusate  1 tablet Oral BID  . sevelamer carbonate  3,200 mg Oral TID WC  . sodium chloride  3 mL Intravenous Q12H  . sodium chloride  3 mL Intravenous Q12H  . venlafaxine XR  75 mg Oral Q breakfast   . sodium chloride 50 mL/hr at 06/07/14 2109   sodium chloride,  acetaminophen, acetaminophen, HYDROcodone-acetaminophen, HYDROmorphone (DILAUDID) injection, menthol-cetylpyridinium, ondansetron (ZOFRAN) IV, ondansetron, phenol, sevelamer carbonate, sodium chloride

## 2014-06-11 ENCOUNTER — Inpatient Hospital Stay
Admission: RE | Admit: 2014-06-11 | Discharge: 2014-07-02 | Disposition: A | Discharge status: Home / Self Care | Payer: Medicare Other | Source: Ambulatory Visit | Attending: Internal Medicine | Admitting: Internal Medicine

## 2014-06-11 DIAGNOSIS — R609 Edema, unspecified: Secondary | ICD-10-CM

## 2014-06-11 DIAGNOSIS — R7611 Nonspecific reaction to tuberculin skin test without active tuberculosis: Principal | ICD-10-CM

## 2014-06-11 LAB — CBC
HCT: 24.9 % — ABNORMAL LOW (ref 39.0–52.0)
HEMATOCRIT: 22.3 % — AB (ref 39.0–52.0)
HEMOGLOBIN: 7.6 g/dL — AB (ref 13.0–17.0)
Hemoglobin: 8.5 g/dL — ABNORMAL LOW (ref 13.0–17.0)
MCH: 32.1 pg (ref 26.0–34.0)
MCH: 32.1 pg (ref 26.0–34.0)
MCHC: 34.1 g/dL (ref 30.0–36.0)
MCHC: 34.1 g/dL (ref 30.0–36.0)
MCV: 94.1 fL (ref 78.0–100.0)
MCV: 94 fL (ref 78.0–100.0)
Platelets: 235 10*3/uL (ref 150–400)
Platelets: 269 10*3/uL (ref 150–400)
RBC: 2.37 MIL/uL — ABNORMAL LOW (ref 4.22–5.81)
RBC: 2.65 MIL/uL — ABNORMAL LOW (ref 4.22–5.81)
RDW: 13.8 % (ref 11.5–15.5)
RDW: 13.7 % (ref 11.5–15.5)
WBC: 6.9 10*3/uL (ref 4.0–10.5)
WBC: 6.1 10*3/uL (ref 4.0–10.5)

## 2014-06-11 LAB — RENAL FUNCTION PANEL
ANION GAP: 16 — AB (ref 5–15)
Albumin: 2.7 g/dL — ABNORMAL LOW (ref 3.5–5.2)
BUN: 22 mg/dL (ref 6–23)
CALCIUM: 8 mg/dL — AB (ref 8.4–10.5)
CO2: 26 mEq/L (ref 19–32)
Chloride: 96 mEq/L (ref 96–112)
Creatinine, Ser: 5.16 mg/dL — ABNORMAL HIGH (ref 0.50–1.35)
GFR calc Af Amer: 12 mL/min — ABNORMAL LOW (ref >90)
GFR calc non Af Amer: 10 mL/min — ABNORMAL LOW (ref >90)
GLUCOSE: 100 mg/dL — AB (ref 70–99)
POTASSIUM: 3.7 meq/L (ref 3.7–5.3)
Phosphorus: 2.2 mg/dL — ABNORMAL LOW (ref 2.3–4.6)
Sodium: 138 mEq/L (ref 137–147)

## 2014-06-11 LAB — GLUCOSE, CAPILLARY
GLUCOSE-CAPILLARY: 115 mg/dL — AB (ref 70–99)
Glucose-Capillary: 101 mg/dL — ABNORMAL HIGH (ref 70–99)

## 2014-06-11 MED ORDER — HEPARIN SODIUM (PORCINE) 1000 UNIT/ML DIALYSIS: 1500.0000 [IU] | INTRAMUSCULAR | Status: DC | PRN

## 2014-06-11 MED ORDER — SODIUM CHLORIDE 0.9 % IV SOLN: 100.0000 mL | INTRAVENOUS | Status: DC | PRN

## 2014-06-11 MED ORDER — ALTEPLASE 2 MG IJ SOLR: 2.0000 mg | Freq: Once | INTRAMUSCULAR | Status: DC | PRN

## 2014-06-11 MED ORDER — HYDROMORPHONE HCL PF 1 MG/ML IJ SOLN
INTRAMUSCULAR | Status: AC
  Filled 2014-06-11: qty 1

## 2014-06-11 MED ORDER — NEPRO/CARBSTEADY PO LIQD
237.0000 mL | ORAL | Status: DC | PRN
  Filled 2014-06-11: qty 237

## 2014-06-11 MED ORDER — LIDOCAINE-PRILOCAINE 2.5-2.5 % EX CREA
1.0000 "application " | TOPICAL_CREAM | CUTANEOUS | Status: DC | PRN
  Filled 2014-06-11: qty 5

## 2014-06-11 MED ORDER — ENOXAPARIN SODIUM 30 MG/0.3ML ~~LOC~~ SOLN: 30.0000 mg | SUBCUTANEOUS | Status: DC

## 2014-06-11 MED ORDER — HEPARIN SODIUM (PORCINE) 1000 UNIT/ML DIALYSIS: 1000.0000 [IU] | INTRAMUSCULAR | Status: DC | PRN

## 2014-06-11 MED ORDER — LIDOCAINE HCL (PF) 1 % IJ SOLN: 5.0000 mL | INTRAMUSCULAR | Status: DC | PRN

## 2014-06-11 MED ORDER — SENNOSIDES-DOCUSATE SODIUM 8.6-50 MG PO TABS: 1.0000 | ORAL_TABLET | Freq: Two times a day (BID) | ORAL | Status: DC

## 2014-06-11 MED ORDER — PENTAFLUOROPROP-TETRAFLUOROETH EX AERO: 1.0000 "application " | INHALATION_SPRAY | CUTANEOUS | Status: DC | PRN

## 2014-06-11 MED ORDER — DOXERCALCIFEROL 4 MCG/2ML IV SOLN
INTRAVENOUS | Status: AC
  Administered 2014-06-11: 1 ug via INTRAVENOUS
  Filled 2014-06-11: qty 2

## 2014-06-11 MED ORDER — POLYETHYLENE GLYCOL 3350 17 G PO PACK: 17.0000 g | PACK | Freq: Every day | ORAL | Status: DC

## 2014-06-11 MED ORDER — HEPARIN SODIUM (PORCINE) 1000 UNIT/ML DIALYSIS: 2000.0000 [IU] | INTRAMUSCULAR | Status: DC | PRN

## 2014-06-11 NOTE — Clinical Social Work Note (Addendum)
2:05pm- SNF is prepared for pt admission today.  Per MD documentation, possible dc today pending stability after HD.  Please contact CSW once dc is confirmed.  12:01pm- Medications: sensipar, sevelamar, renvela- this was communicated to wife.  CSW informed wife of acceptance at [facility].  Wife agreeable.  Wife reports that family will transport pt to HD once discharged to SNF.  CSW confirmed with SNF that assistance will be available for family in regards to getting the pt in the vehicle for transportation to HD.  Projected discharge is today after HD, pending stability.  Wife and SNF aware.  11:52am- Pt has been accepted to [facility], SNF.  [place] request CSW to inquire if family will be transporting pt to HD or [facility].  [place] will charge approximately $50 per trip if they are to transport.  [place] is also requesting two medications be sent with the patient.  CSW will confirm the medications with [facility] and confirm with wife.  Nonnie Done, [place] [phone]  Clinical Social Work

## 2014-06-11 NOTE — Progress Notes (Signed)
Physical Therapy Treatment Patient Details Name: THERESA DOHRMAN MRN: [id number] DOB: [birth year] Today's Date: 06/11/2014    History of Present Illness pt presents after a fall resulting in L Femur fx.  pt now post THA.      PT Comments    Pt continues to make great progress.  Noted D/C plan now changed for pt to D/C to [facility] as family unable to A pt at home.  Will continue to follow.    Follow Up Recommendations  SNF     Equipment Recommendations  3in1 (PT)    Recommendations for Other Services       Precautions / Restrictions Precautions Precautions: Anterior Hip;Fall Restrictions Weight Bearing Restrictions: Yes LLE Weight Bearing: Weight bearing as tolerated    Mobility  Bed Mobility Overal bed mobility: Needs Assistance Bed Mobility: Supine to Sit     Supine to sit: Min assist;HOB elevated     General bed mobility comments: A with L LE only.    Transfers Overall transfer level: Needs assistance Equipment used: Rolling walker (2 wheeled) Transfers: Sit to/from Stand Sit to Stand: Supervision         General transfer comment: cues for hand placement and safety with RW  Ambulation/Gait Ambulation/Gait assistance: Supervision Ambulation Distance (Feet): 150 Feet Assistive device: Rolling walker (2 wheeled) Gait Pattern/deviations: Step-through pattern;Decreased step length - right;Decreased stance time - left;Decreased stride length;Trunk flexed   Gait velocity interpretation: Below normal speed for age/gender General Gait Details: cues for upright posture and positioning within RW.     Stairs Stairs: Yes Stairs assistance: Min guard Stair Management: Two rails;Step to pattern;Forwards Number of Stairs: 2 (x2) General stair comments: cues for gait sequencing and safety on stairs.    Wheelchair Mobility    Modified Rankin (Stroke Patients Only)       Balance                                    Cognition  Arousal/Alertness: Awake/alert Behavior During Therapy: WFL for tasks assessed/performed Overall Cognitive Status: Within Functional Limits for tasks assessed                      Exercises      General Comments        Pertinent Vitals/Pain Pain Assessment: 0-10 Pain Score: 8  Pain Location: L hip Pain Descriptors / Indicators: Sore Pain Intervention(s): Monitored during session;Premedicated before session;Repositioned    Home Living                      Prior Function            PT Goals (current goals can now be found in the care plan section) Acute Rehab PT Goals PT Goal Formulation: With patient Time For Goal Achievement: 06/15/14 Potential to Achieve Goals: Good Progress towards PT goals: Progressing toward goals    Frequency  Min 5X/week    PT Plan Discharge plan needs to be updated    Co-evaluation             End of Session Equipment Utilized During Treatment: Gait belt Activity Tolerance: Patient tolerated treatment well Patient left: in chair;with call bell/phone within reach;with family/visitor present     Time: 2263-3354 PT Time Calculation (min): 26 min  Charges:  $Gait Training: 23-37 mins  G CodesCatarina Hartshorn, [place] 06/11/2014, 2:41 PM

## 2014-06-11 NOTE — Procedures (Signed)
Patient was seen on dialysis and the procedure was supervised. BFR 400 Via right fem TDC BP is 81/56.  Patient appears to be tolerating treatment well

## 2014-06-11 NOTE — Discharge Summary (Signed)
Physician Discharge Summary  [name] TOI:[id number] DOB: 1946-08-23 DOA: 06/05/2014  PCP: Glo Herring., MD  Admit date: 06/05/2014 Discharge date: 06/11/2014  Time spent: 45 minutes  Recommendations for Outpatient Follow-up:  1. FU with Dr.Blackman in 10days 2. Discontinue lovenox after 3 weeks, please monitor platelet count while on lovenox weekly  Discharge Diagnoses:  Principal Problem:   Subcapital fracture of femur Active Problems:   ESRD (end stage renal disease)   Amyloid heart disease   Chronic diastolic congestive heart failure, NYHA class 2   Closed left hip fracture   Anemia of chronic disease   Secondary hyperparathryroidism  Discharge Condition: stable  Diet recommendation: Renal   Filed Weights   06/08/14 1759 06/08/14 2117 06/11/14 0757  Weight: 68.7 kg (151 lb 7.3 oz) 67.6 kg (149 lb 0.5 oz) 71.5 kg (157 lb 10.1 oz)    History of present illness:  Chief Complaint: left hip pain  HPI: [name] is a 68 y.o. 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 He denies any prodromal symptoms, no chest pain or shortness of breath.   Hospital Course:  1. L Hip fracture -s/p L hip hemiarthroplasty 8/13 per Dr.Blackman  -continue lovenox for DVT proph for 3 weeks -PT following, SNF for rehabilitation  2. Cardiac amyloidosis  -followed closely by Cards at [facility]  -stable, h/o RV failure  -continue midodrine   3. ESRD on HD  -HD cath in L groin, limited access options  -HD per Renal  -recent R chest HD cath removal at [facility] for refractory culture negative fevers, stable and afebrile  here  4. Chronic hypotension  -BP runs in 70-80s range at baseline, asymptomatic  -continue midodrine   5. Anemia of chronic disease  -stable  6. Hyperkalemia  -corrected with HD   7. Constipation  -BM today, continue laxatives and stool softeners    Procedures: L hip hemiarthroplasty 8/13 per Dr.Blackman   Consultations:  Ortho  Renal  Discharge Exam: Filed Vitals:   06/11/14 1030  BP: 77/54  Pulse: 60  Temp:   Resp:     General: AAOx3 Cardiovascular: S1S2/RRR Respiratory: CTAB  Discharge Instructions You were cared for by a hospitalist during your hospital stay. If you have any questions about your discharge medications or the care you received while you were in the hospital after you are discharged, you can call the unit and asked to speak with the hospitalist on call if the hospitalist that took care of you is not available. Once you are discharged, your primary care physician will handle any further medical issues. Please note that NO REFILLS for any discharge medications will be authorized once you are discharged, as it is imperative that you return to your primary care physician (or establish a relationship with a primary care physician if you do not have one) for your aftercare needs so that they can reassess your need for medications and monitor your lab values.  Discharge Instructions   Full weight bearing    Complete by:  As directed             Medication List    STOP taking these medications  levofloxacin 500 MG tablet  Commonly known as:  LEVAQUIN     phenylephrine 20 mg in dextrose 5 % 250 mL     piperacillin-tazobactam 2-0.25 GM/50ML IVPB  Commonly known as:  ZOSYN     Vancomycin 750 MG/150ML Soln  Commonly known as:  VANCOCIN      TAKE these medications       acetaminophen 325 MG tablet  Commonly known as:  TYLENOL  Take 2 tablets (650 mg total) by mouth every 6 (six) hours as needed for mild pain (or Fever >/= 101).      allopurinol 100 MG tablet  Commonly known as:  ZYLOPRIM  Take 100 mg by mouth daily.     aspirin 81 MG tablet  Take 81 mg by mouth daily.     cinacalcet 90 MG tablet  Commonly known as:  SENSIPAR  Take 90 mg by mouth daily. With evening meal     enoxaparin 30 MG/0.3ML injection  Commonly known as:  LOVENOX  Inject 0.3 mLs (30 mg total) into the skin daily. For 3 weeks for DVT prophylaxis following Hip fracture     HYDROcodone-acetaminophen 5-325 MG per tablet  Commonly known as:  NORCO/VICODIN  Take 1-2 tablets by mouth every 4 (four) hours as needed for moderate pain.     metoCLOPramide 10 MG tablet  Commonly known as:  REGLAN  Take 20 mg by mouth Three times a day.     midodrine 10 MG tablet  Commonly known as:  PROAMATINE  Take 10 mg by mouth 3 (three) times daily.     multivitamin Tabs tablet  Take 1 tablet by mouth daily.     omeprazole 20 MG capsule  Commonly known as:  PRILOSEC  Take 40 mg by mouth 2 (two) times daily before a meal.     polyethylene glycol packet  Commonly known as:  MIRALAX / GLYCOLAX  Take 17 g by mouth daily.     senna-docusate 8.6-50 MG per tablet  Commonly known as:  Senokot-S  Take 1 tablet by mouth 2 (two) times daily.     sevelamer carbonate 800 MG tablet  Commonly known as:  RENVELA  Take 3,200 mg by mouth 3 (three) times daily with meals. 3200 mg (4 tablets) with meals and 800 mg (1 tablet) with snacks     Venlafaxine HCl 75 MG Tb24  Take 75 mg by mouth daily.       No Known Allergies     Follow-up Information   Follow up with Mcarthur Rossetti, MD. Schedule an appointment as soon as possible for a visit in 2 weeks.   Specialty:  Orthopedic Surgery   Contact information:   [place] [address] [phone]        The results of significant diagnostics from this hospitalization (including imaging, microbiology, ancillary and laboratory) are listed below for reference.    Significant  Diagnostic Studies: Dg Chest 1 View  06/05/2014   CLINICAL DATA:  Pre operative respiratory exam.  Left hip fracture.  EXAM: CHEST - 1 VIEW  COMPARISON:  CT scan and chest x-ray dated 05/09/2014  FINDINGS: There is chronic cardiomegaly. Dialysis catheter with the tip in the right atrium. Lungs are clear. Pulmonary vascularity is normal. No acute osseous abnormality.  IMPRESSION: No acute abnormality.  Chronic cardiomegaly.   Electronically Signed   By: Rozetta Nunnery M.D.   On: 06/05/2014 15:15   Dg Hip Complete Left  06/05/2014   CLINICAL DATA:  Left hip pain secondary to a fall.  EXAM: LEFT HIP - COMPLETE 2+ VIEW  COMPARISON:  None.  FINDINGS: There is an angulated overriding subcapital fracture of the left femoral neck. Osteopenia. Pelvic bones are intact.  IMPRESSION: Angulated subcapital fracture of the proximal left femur.   Electronically Signed   By: Rozetta Nunnery M.D.   On: 06/05/2014 15:13   Dg Pelvis Portable  06/07/2014   CLINICAL DATA:  LEFT hip hemiarthroplasty. Postoperative radiographs.  EXAM: PORTABLE PELVIS 1-2 VIEWS  COMPARISON:  None.  FINDINGS: New LEFT hip hemiarthroplasty. LEFT femoral dialysis catheter projects over the LEFT hip. Expected postsurgical changes in the soft tissues. There are no complicating features. The hemiarthroplasty appears located. Moderate RIGHT hip osteoarthritis incidentally noted.  IMPRESSION: Uncomplicated new LEFT total hip arthroplasty.   Electronically Signed   By: Dereck Ligas M.D.   On: 06/07/2014 20:25   Dg Femur Left Port  06/05/2014   CLINICAL DATA:  Status post fall  EXAM: PORTABLE LEFT FEMUR - 2 VIEW  COMPARISON:  None.  FINDINGS: Mildly displaced and angulated left femoral neck fracture. No dislocation. No lytic or sclerotic osseous lesion. Tricompartmental osteoarthritis of the left knee.  IMPRESSION: Mildly displaced and angulated left femoral neck fracture.   Electronically Signed   By: Kathreen Devoid   On: 06/05/2014 16:24     Microbiology: Recent Results (from the past 240 hour(s))  SURGICAL PCR SCREEN     Status: None   Collection Time    06/07/14  8:30 AM      Result Value Ref Range Status   MRSA, PCR NEGATIVE  NEGATIVE Final   Staphylococcus aureus NEGATIVE  NEGATIVE Final   Comment:            The Xpert SA Assay (FDA     approved for NASAL specimens     in patients over 56 years of age),     is one component of     a comprehensive surveillance     program.  Test performance has     been validated by [employer] for patients greater     than or equal to 64 year old.     It is not intended     to diagnose infection nor to     guide or monitor treatment.     Labs: Basic Metabolic Panel:  Recent Labs Lab 06/06/14 1918 06/07/14 0710 06/08/14 0450 06/09/14 0508 06/11/14 0910  NA 134* 137 138 138 138  K 4.9 4.9 5.9* 4.9 3.7  CL 92* 97 97 97 96  CO2 22 24 22 24 26   GLUCOSE 98 92 100* 92 100*  BUN 28* 19 28* 20 22  CREATININE 9.27* 7.35* 8.72* 5.80* 5.16*  CALCIUM 7.7* 7.9* 7.7* 9.0 8.0*  PHOS 3.2  --   --   --  2.2*   Liver Function Tests:  Recent Labs Lab 06/06/14 1918 06/11/14 0910  ALBUMIN 3.1* 2.7*   No results found for this basename: LIPASE, AMYLASE,  in the last 168 hours No results found for this basename: AMMONIA,  in the last 168 hours CBC:  Recent Labs Lab 06/05/14 1424 06/06/14 0526 06/07/14 0710 06/08/14 0450 06/09/14 0508 06/11/14 0909  WBC 5.9 5.2 7.4 6.3 5.9 6.9  NEUTROABS 4.3  --   --   --   --   --   HGB 9.5* 9.9* 10.0* 9.2* 9.1* 7.6*  HCT 28.4* 30.2* 30.5* 28.0* 27.8* 22.3*  MCV  99.0 100.0 96.8 100.0 99.6 94.1  PLT 200 207 195 188 195 235   Cardiac Enzymes: No results found for this basename: CKTOTAL, CKMB, CKMBINDEX, TROPONINI,  in the last 168 hours BNP: BNP (last 3 results)  Recent Labs  02/12/14 1139 05/09/14 1100  PROBNP 21129.0* 32097.0*   CBG: No results found for this basename: GLUCAP,  in the last 168  hours     Signed:  Shifra Swartzentruber  [facility] 06/11/2014, 10:53 AM

## 2014-06-11 NOTE — Progress Notes (Signed)
Pt seen on HD, stable and doing well but Hb dropped from yetserday, may be a spurious lab, will repeat CBC post HD and if stable will plan DC to SNF today  Domenic Polite, MD [phone]

## 2014-06-11 NOTE — Clinical Social Work Note (Addendum)
Pt dc to [facility], SNF RN to call report to: [phone] Transportation:PTAR Wife to provide medications: senipar, sevelamar, renelva- wife agreeable Family to provide transportation to HD from facility- wife agreeable CSW updated wife at bedside and prepared dc packet including signed FL2 and hard scripts. CSW confirmed admission to SNF with Marianna Fuss at [facility], SNF.  Nonnie Done, [place] [phone]  Clinical Social Work

## 2014-06-11 NOTE — Progress Notes (Signed)
At 0411 pt had a run of 11 PVC's. He was sleeping at the time. Reverted to NSR with occasional multifocal PVC's /

## 2014-06-11 NOTE — Progress Notes (Signed)
Physical Therapy Note  Pt currently at HD this morning.  Will f/u another time.    [place], [place]

## 2014-06-11 NOTE — Care Management Note (Signed)
CARE MANAGEMENT NOTE 06/11/2014  Patient:  PRAYAN, ULIN   Account Number:  [id number]  Date Initiated:  06/08/2014  Documentation initiated by:  Ricki Miller  Subjective/Objective Assessment:   68 yr old male s/p left hip hemiarthroplasty. Pateint is on dialysis-Mon/Wed/Fri.     Action/Plan:   Patient and family want to check into CIR vs SNF vs Home Health. Case manager will continue to monitor. Will offer choice   Anticipated DC Date:  06/11/2014   Anticipated DC Plan:  SKILLED NURSING FACILITY  In-house referral  Clinical Social Worker      DC Planning Services  CM consult      [facility] Choice  NA   Choice offered to / List presented to:     DME arranged  NA        [place] arranged  NA      Status of service:  Completed, signed off Medicare Important Message given?  YES (If response is "NO", the following Medicare IM given date fields will be blank) Date Medicare IM given:  06/08/2014 Medicare IM given by:  Ricki Miller Date Additional Medicare IM given:  06/11/2014 Additional Medicare IM given by:  Ricki Miller  Discharge Disposition:  [place]  Per UR Regulation:  Reviewed for med. necessity/level of care/duration of stay

## 2014-06-12 ENCOUNTER — Other Ambulatory Visit: Payer: Self-pay | Admitting: *Deleted

## 2014-06-12 MED ORDER — HYDROCODONE-ACETAMINOPHEN 5-325 MG PO TABS: 1.0000 | ORAL_TABLET | ORAL | Status: DC | PRN

## 2014-06-12 NOTE — Telephone Encounter (Signed)
[facility] 

## 2014-06-13 ENCOUNTER — Other Ambulatory Visit: Payer: Self-pay | Admitting: *Deleted

## 2014-06-13 ENCOUNTER — Ambulatory Visit (HOSPITAL_COMMUNITY): Payer: No Typology Code available for payment source | Attending: Internal Medicine

## 2014-06-13 DIAGNOSIS — R7611 Nonspecific reaction to tuberculin skin test without active tuberculosis: Secondary | ICD-10-CM | POA: Diagnosis present

## 2014-06-13 DIAGNOSIS — Z859 Personal history of malignant neoplasm, unspecified: Secondary | ICD-10-CM | POA: Insufficient documentation

## 2014-06-13 MED ORDER — HYDROCODONE-ACETAMINOPHEN 5-325 MG PO TABS: ORAL_TABLET | ORAL | Status: DC

## 2014-06-13 NOTE — Telephone Encounter (Signed)
[facility] 

## 2014-06-14 ENCOUNTER — Ambulatory Visit (HOSPITAL_COMMUNITY)
Admit: 2014-06-14 | Discharge: 2014-06-14 | Disposition: A | Discharge status: Home / Self Care | Payer: No Typology Code available for payment source | Source: Ambulatory Visit | Attending: Internal Medicine | Admitting: Internal Medicine

## 2014-06-14 ENCOUNTER — Non-Acute Institutional Stay (SKILLED_NURSING_FACILITY): Payer: Medicare Other | Admitting: Internal Medicine

## 2014-06-14 DIAGNOSIS — I9589 Other hypotension: Secondary | ICD-10-CM

## 2014-06-14 DIAGNOSIS — S72009D Fracture of unspecified part of neck of unspecified femur, subsequent encounter for closed fracture with routine healing: Secondary | ICD-10-CM

## 2014-06-14 DIAGNOSIS — S72019D Unspecified intracapsular fracture of unspecified femur, subsequent encounter for closed fracture with routine healing: Secondary | ICD-10-CM

## 2014-06-14 DIAGNOSIS — M79609 Pain in unspecified limb: Secondary | ICD-10-CM | POA: Insufficient documentation

## 2014-06-14 DIAGNOSIS — R609 Edema, unspecified: Secondary | ICD-10-CM | POA: Insufficient documentation

## 2014-06-14 DIAGNOSIS — N186 End stage renal disease: Secondary | ICD-10-CM

## 2014-06-14 DIAGNOSIS — I5032 Chronic diastolic (congestive) heart failure: Secondary | ICD-10-CM

## 2014-06-14 NOTE — Progress Notes (Signed)
Patient ID: [name], male   DOB: [birth year], 68 y.o.   MRN: [id number]   This is an acute visit.  Level  care skilled.  Facility [facility]  Chief complaint-acute visit status post hospitalization for left hip fracture with repair.  History of present illness.  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  Apparently he he fell trying to put something on the door and fractured his left hip.  He had a l arthroplasty and apparently tolerated the procedure well he continues on Lovenox for DVT prophylaxis and is here for physical therapy.  As far as his cardiac issues he followed by cardiology at [facility] he does have a history of right ventricular failure he is on midodrine  with a history of hypertension  Currently he has no complaints vital signs are stable his blood pressure is low at 88/60 but apparently this is baseline he does not appear to be overtly symptomatic.  Medical history.  History of left hip fracture status post repair.  Hypotension.  Amyloid heart disease.  Cardiomyopathy.  Gout.  BPH.  End stage renal disease on dialysis.  Previous surgical history.  Cholecystectomy.  Hernia repair.  AV fistula placement.  Nephrectomy on the left.  Social history-he quit smoking 32 years ago smoking is included cigarettes he has a 0.5 pack year smoking history-no smokeless tobacco history-denies alcohol or illicit drug use.  Family history.  History of cancer in his mother-bone cancer and heart failure in his father.  Medications.  Tylenol 650 mg every 6 hours when necessary.  Allopurinol 100 mg daily.  Aspirin 81 mg daily.  Sensipar 90 mg daily.  Lovenox 30 mg daily.  Norco 5-325 mg 1 or 2 tabs every 4 hours when necessary pain.  Reglan 20 mg 3  times a day.  Midodrine 20 mg 3 times a day.  Multivitamin daily.  Prilosec 40 mg twice a day before meals.  MiraLax daily.  Senokot S. one tablet twice a day.  Renvela 3200 mg 3 times a day.  Sotalol 75 mg daily.  Review of systems.  In general no complaints of fever or chills.  Skin does not complaining of rash or itching.  Head ears eyes nose mouth and throat-does not complaining of visual changes or sore throat.  Respiratory no complaints of shortness of breath or cough.  Cardiac no chest pain does have some edema of his left leg.  GI does not complaining of nausea vomiting diarrhea or constipation or abdominal discomfort.  GU does have a history of end-stage renal disease on dialysis.  Muscle skeletal does not complaining of joint pain currently does have hip fracture that was surgically repaired.  Neurologic is not complaining of dizziness or headache or numbness.  Psych history of depression but does not complaining of depressive symptoms today.  Physical exam.  Temperature is 99.5 pulse 86 respirations 20 blood pressure 88/60.  In general this is a pleasant elderly male in no distress lying comfortably in bed.  The skin is warm and dry.--Old shunt sites are visible on his arms bilaterally--history of cath now in the left groin  Surgical site on left hip currently covered with dry dressing per nursing staff there is no sign of infection significant drainage or surrounding erythema.  Eyes pupils appear reactive to light visual acuity intact.  Oropharynx clear mucous membranes moist.  His chest is clear to auscultation there is no labored breathing.  Heart is regular rate and rhythm without murmur gallop or rub he does have 1+ left leg edema was somewhat reduced pedal pulse on the left.  Abdomen is soft obese nontender with positive bowel sounds.  Muscle skeletal moves all extremities except very limited on the left lower extremity status post surgery  strength appears to be grossly intact although limited exam since patient was in bed.  Neurologic is grossly intact no lateralizing findings his speech is clear.  Psych he is alert and oriented pleasant and appropriate  Labs.  Is 12 2015.  Sodium 134 potassium 4.9 BUN 28 creatinine 9.21.  06/11/2014.  Sodium 138 potassium 3.7 BUN 22 creatinine 5.6.  WBC 6.9 hemoglobin 7.6 platelets 235.  Assessment plan.  #1-history of left hip fracture with repair he appears to be stable in this regard he is on Lovenox for anticoagulation and Norco for pain-some concern with left lower leg edema although this could be typical postop-will order a venous Doppler rule out clot--will be receiving therapy here.  #2-history of amyloid heart disease chronic diastolic CHF-this is followed by cardiology at [facility] does continue on aspirin at this point appears to be stable clinically.  #3-history of hypotension again this is chronic and appears to be at baseline today he is on Midodrine  #4-history end-stage renal disease again he continues with dialysis 3 days a week on Monday Wednesday and Friday.  #5-history of chronic anemia most likely due to renal disease this has been stable.  #6-what appears to be some history of GERD he is on Prilosec twice a day also on Reglan-pharmacy has recommended reducing Reglan dose secondary to renal issues- will reduce dose and monitor for any onset of increased to symptoms  #7-constipation-apparently this was an issue in the hospital he is on MiraLax and Senokot continue to monitor.  Apparently some history of gout he is on allopurinol I suspect prophylactically does not show evidence of this currently.   pharmacy has recommended obtaining a CBC with platelets secondary to patient being on Lovenox we'll do this as well.  [id number]--of note more than 40 minutes spent assessing patient-reviewing his chart-and coordinating and formulating a plan of care for numerous  diagnoses-of note greater than 50% of time spent coordinating plan of care

## 2014-06-15 ENCOUNTER — Ambulatory Visit (HOSPITAL_COMMUNITY)
Admit: 2014-06-15 | Discharge: 2014-06-15 | DRG: 682 | Disposition: A | Discharge status: Home / Self Care | Payer: Medicare Other | Source: Skilled Nursing Facility | Attending: Nephrology | Admitting: Nephrology

## 2014-06-15 DIAGNOSIS — I5032 Chronic diastolic (congestive) heart failure: Secondary | ICD-10-CM | POA: Diagnosis present

## 2014-06-15 DIAGNOSIS — N186 End stage renal disease: Secondary | ICD-10-CM | POA: Diagnosis present

## 2014-06-15 DIAGNOSIS — I2510 Atherosclerotic heart disease of native coronary artery without angina pectoris: Secondary | ICD-10-CM | POA: Diagnosis present

## 2014-06-15 DIAGNOSIS — I120 Hypertensive chronic kidney disease with stage 5 chronic kidney disease or end stage renal disease: Secondary | ICD-10-CM | POA: Diagnosis present

## 2014-06-15 MED ORDER — HEPARIN SODIUM (PORCINE) 1000 UNIT/ML IJ SOLN
INTRAMUSCULAR | Status: AC
  Filled 2014-06-15: qty 1

## 2014-06-15 MED ORDER — LIDOCAINE HCL 1 % IJ SOLN
INTRAMUSCULAR | Status: AC
  Filled 2014-06-15: qty 20

## 2014-06-15 MED ORDER — IOHEXOL 300 MG/ML  SOLN
50.0000 mL | Freq: Once | INTRAMUSCULAR | Status: AC | PRN
  Administered 2014-06-15: 15 mL via INTRAVENOUS

## 2014-06-15 MED ORDER — CEFAZOLIN SODIUM-DEXTROSE 2-3 GM-% IV SOLR
2.0000 g | Freq: Once | INTRAVENOUS | Status: AC
  Administered 2014-06-15: 2 g via INTRAVENOUS

## 2014-06-15 MED ORDER — CEFAZOLIN SODIUM-DEXTROSE 2-3 GM-% IV SOLR
INTRAVENOUS | Status: AC
  Administered 2014-06-15: 2 g via INTRAVENOUS
  Filled 2014-06-15: qty 50

## 2014-06-15 MED ORDER — CHLORHEXIDINE GLUCONATE 4 % EX LIQD
CUTANEOUS | Status: AC
  Filled 2014-06-15: qty 15

## 2014-06-15 MED ORDER — SODIUM CHLORIDE 0.9 % IV SOLN: Freq: Once | INTRAVENOUS | Status: DC

## 2014-06-15 NOTE — Procedures (Signed)
Successful LT FEMORAL HD CATH INJECTION AND EXCHANGE NO COMP STABLE READY FOR USE

## 2014-06-17 ENCOUNTER — Encounter: Payer: Self-pay | Admitting: Internal Medicine

## 2014-06-18 ENCOUNTER — Non-Acute Institutional Stay (SKILLED_NURSING_FACILITY): Payer: Medicare Other | Admitting: Internal Medicine

## 2014-06-18 DIAGNOSIS — N186 End stage renal disease: Secondary | ICD-10-CM

## 2014-06-18 DIAGNOSIS — E889 Metabolic disorder, unspecified: Secondary | ICD-10-CM

## 2014-06-18 DIAGNOSIS — I43 Cardiomyopathy in diseases classified elsewhere: Secondary | ICD-10-CM

## 2014-06-18 DIAGNOSIS — S72009D Fracture of unspecified part of neck of unspecified femur, subsequent encounter for closed fracture with routine healing: Secondary | ICD-10-CM

## 2014-06-18 DIAGNOSIS — E639 Nutritional deficiency, unspecified: Secondary | ICD-10-CM

## 2014-06-18 DIAGNOSIS — R188 Other ascites: Secondary | ICD-10-CM

## 2014-06-18 DIAGNOSIS — E854 Organ-limited amyloidosis: Secondary | ICD-10-CM

## 2014-06-18 DIAGNOSIS — E8589 Other amyloidosis: Secondary | ICD-10-CM

## 2014-06-18 DIAGNOSIS — S72019D Unspecified intracapsular fracture of unspecified femur, subsequent encounter for closed fracture with routine healing: Secondary | ICD-10-CM

## 2014-06-20 NOTE — Progress Notes (Addendum)
Patient ID: [name], male   DOB: September 19, 1946, 68 y.o.   MRN: [id number]               HISTORY & PHYSICAL  DATE:  06/18/2014    FACILITY: [facility]       LEVEL OF CARE:   SNF   CHIEF COMPLAINT:  Admission to SNF, post stay at [facility], [id number] through 06/11/2014.    HISTORY OF PRESENT ILLNESS:  This is a medically-complex, 68 year-old man with end-stage renal disease, on dialysis.  He had a fall while trying to put something on the door at home.  He suffered a left hip fracture and underwent a left hip hemiarthroplasty on 06/07/2014.    PAST MEDICAL HISTORY/PROBLEM LIST:  His medical history includes:    Restrictive cardiomyopathy secondary to amyloid.     Chronic renal failure secondary to hypertension (?amyloid).  He was recently in [facility] for a spontaneous bacterial peritonitis.  I see that he has longstanding ascites.  He has had a paracentesis.  In fact, a recent culture of this ascitic fluid on 05/10/2014 showed no growth.  I do not see the cell count.    CURRENT MEDICATIONS:  Medication list is reviewed.    Tylenol 650 q.6.    Allopurinol 100 q.d.    ASA 81 q.d.    Sensipar 90 daily.    Lovenox 30 into the skin three weeks for DVT prophylaxis.    Reglan 20 mg three times a day.    Midodrine 10 mg three times a day.    Prilosec 40 b.i.d.    MiraLAX 17 g q.d.    Senokot-S 1 tablet twice daily.    Renvala 3200 mg by mouth three times a day with meals.    Venlafaxine 75 mg daily.    PHYSICAL EXAMINATION:   GENERAL APPEARANCE:  The patient is not in any distress.   CHEST/RESPIRATORY:  Clear air entry bilaterally.   CARDIOVASCULAR:  CARDIAC:   Heart sounds are normal.  JVP is not elevated.   GASTROINTESTINAL:  ABDOMEN:   He has significant abdominal distension with very clear ascites.  There is no tenderness present here.   LIVER/SPLEEN/KIDNEYS:  No liver, no spleen are palpable.   CIRCULATION:   EDEMA/VARICOSITIES:  Extremities:  Mild edema.     ASSESSMENT/PLAN:  Left hip fracture.  Status post left hip hemiarthroplasty.  He is on Lovenox for DVT prophylaxis.    End-stage renal failure.   On dialysis.   Possibly hypertension.    Restrictive cardiomyopathy secondary to amyloid.  There is no evidence currently of a difficulty.  Specifically no evidence of CHF.  Ascitic fluid, which is fairly extensive.  I note he had a recent paracentesis.  The exact cause of this is not clear.  I am assuming if he is on maintenance paracentesis, the etiology of this is known to me at this point.   CPT CODE: 28413           ADDENDUM:  He may be going home soon.  Will follow up with Dr. Ninfa Linden tomorrow.

## 2014-07-01 ENCOUNTER — Encounter: Payer: Self-pay | Admitting: Internal Medicine

## 2014-07-01 NOTE — Progress Notes (Signed)
Patient ID: [name], male   DOB: [birth year], 68 y.o.   MRN: [id number]    This encounter was created in error - please disregard.

## 2014-07-04 ENCOUNTER — Encounter: Payer: Self-pay | Admitting: Internal Medicine

## 2014-07-04 ENCOUNTER — Non-Acute Institutional Stay (SKILLED_NURSING_FACILITY): Payer: Medicare Other | Admitting: Internal Medicine

## 2014-07-04 DIAGNOSIS — I9589 Other hypotension: Secondary | ICD-10-CM

## 2014-07-04 DIAGNOSIS — N186 End stage renal disease: Secondary | ICD-10-CM

## 2014-07-04 DIAGNOSIS — E8589 Other amyloidosis: Secondary | ICD-10-CM

## 2014-07-04 DIAGNOSIS — S72002D Fracture of unspecified part of neck of left femur, subsequent encounter for closed fracture with routine healing: Secondary | ICD-10-CM

## 2014-07-04 DIAGNOSIS — I43 Cardiomyopathy in diseases classified elsewhere: Secondary | ICD-10-CM

## 2014-07-04 DIAGNOSIS — S72009D Fracture of unspecified part of neck of unspecified femur, subsequent encounter for closed fracture with routine healing: Secondary | ICD-10-CM

## 2014-07-04 DIAGNOSIS — E889 Metabolic disorder, unspecified: Secondary | ICD-10-CM

## 2014-07-04 DIAGNOSIS — E854 Organ-limited amyloidosis: Secondary | ICD-10-CM

## 2014-07-04 DIAGNOSIS — D631 Anemia in chronic kidney disease: Secondary | ICD-10-CM

## 2014-07-04 DIAGNOSIS — E639 Nutritional deficiency, unspecified: Secondary | ICD-10-CM

## 2014-07-04 DIAGNOSIS — N039 Chronic nephritic syndrome with unspecified morphologic changes: Secondary | ICD-10-CM

## 2014-07-04 NOTE — Progress Notes (Signed)
Patient ID: [name], male   DOB: 06-04-1946, 68 y.o.   MRN: [id number]   This is a discharge note.  Level of care skilled.  Facility [facility].  Chief complaint-discharge note   History of present illness .  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  Apparently he he fell trying to put something on the door and fractured his left hip.  He had a l arthroplasty and apparently tolerated the procedure well--he was here for rehabilitation and physical therapy and appears to have done well-he had been on Lovenox for anticoagulation this has been switched to aspirin and he will be going home later today.  As far as his cardiac issues he followed by cardiology at [facility] he does have a history of right ventricular failure he is on midodrine with a history of hyportension and blood pressure off and on low here but is any symptomatic most recent blood pressure 93/48-again he appears to tolerate this fairly well  Currently he has no complaints --he has been on numerous leave of absences and apparently did well at home.  He will need continued PT and OT  .  Medical history.  History of left hip fracture status post repair.  Hypotension.  Amyloid heart disease.  Cardiomyopathy.  Gout.  BPH.  End stage renal disease on dialysis.  Previous surgical history.  Cholecystectomy.  Hernia repair.  AV fistula placement.  Nephrectomy on the left .  Social history-he quit smoking 32 years ago smoking is included cigarettes he has a 0.5 pack year smoking history-no smokeless tobacco history-denies alcohol or illicit drug use .  Family history.  History of cancer in his mother-bone cancer and heart failure in his father .  Medications.  Tylenol 650 mg every 6 hours when necessary.    Allopurinol 100 mg daily.  Aspirin 81 mg daily.  Sensipar 90 mg daily.    Norco 5-325 mg 1 or 2 tabs every 4 hours when necessary pain.  Reglan 5 mg 3 times a day.  Midodrine 20 mg 3 times a day.  Multivitamin daily.  Prilosec 40 mg twice a day before meals.  MiraLax daily.  Senokot S. one tablet twice a day.  Renvela 3200 mg 3 times a day.  .   Review of systems .  In general no complaints of fever or chills.  Skin does not complaining of rash or itching.  Head ears eyes nose mouth and throat-does not complaining of visual changes or sore throat.  Respiratory no complaints of shortness of breath or cough.  Cardiac no chest pain does have some edema of his left leg which is not new.  GI does not complaining of nausea vomiting diarrhea or constipation or abdominal discomfort.  GU does have a history of end-stage renal disease on dialysis.  Muscle skeletal does not complaining of joint pain currently does have hip fracture that was surgically repaired--pain appears controlled with the Norco.  Neurologic is not complaining of dizziness or headache or numbness.  Psych history of depression but does not complaining of depressive symptoms today .  Physical exam.  Temperature 98.0-pulse 75-respirations 20-blood pressure 93/48-114/57  In general this is a pleasant elderly male in no distress sitting comfortably in his wheelchair.  The skin is warm and dry.--Old shunt sites are visible on his arms  bilaterally--history of cath now in the left groin   .  Eyes pupils appear reactive to light visual acuity intact.  Oropharynx clear mucous membranes moist.  His chest is clear to auscultation there is no labored breathing.  Heart is regular rate and rhythm with occasional irregular beats without murmur gallop or rub he does have 1+ left leg edema was somewhat reduced pedal pulse on the left.  Abdomen is soft obese somewhat protuberant which is not new nontender with positive bowel sounds.   Muscle skeletal moves all extremities except  limited on the left lower extremity status post surgery strength appears to be grossly intact .  Neurologic is grossly intact no lateralizing findings his speech is clear.  Psych he is alert and oriented pleasant and appropriate    Labs 06/15/2014.  WBC 6.0-hemoglobin 7.6-platelets 295.  .   .  06/11/2014.  Sodium 138 potassium 3.7 BUN 22 creatinine 5.6.  WBC 6.9 hemoglobin 7.6 platelets 235.   Assessment plan .  #1-history of left hip fracture with repair he appears to be stable in this regard he is on ASA for anticoagulation and Norco for pain-continued with some residual left leg edema but a Doppler was done which was negative for DVT is not really having any pain with this .  #2-history of amyloid heart disease chronic diastolic CHF-this is followed by cardiology at [facility] does continue on aspirin at this point appears to be stable clinically .  #3-history of hypotension again this is chronic and appears to be at baseline today he is on Midodrine   #4-history end-stage renal disease again he continues with dialysis 3 days a week on Monday Wednesday and Friday.   #5-history of chronic anemia most likely due to renal disease  .  #6-what appears to be some history of GERD he is on Prilosec twice a day also on Reglan-pharmacy  recommended reducing Reglan dose secondary to renal issues- now on 5 mg 3 times a day and appears to have tolerated this fairly well  #7-constipation-apparently this was an issue in the hospital he is on MiraLax and Senokot -and apparently is having regular bowel movements.  .  Apparently some history of gout he is on allopurinol I suspect prophylactically does not show evidence of this currently.   He will be going home with family he has a very supportive wife who is with him in the room-again he has been on numerous leave of absences and done well with this-he is now off the Lovenox and is being discharged  home he will be continued PT and OT as noted above.  [id number] note greater than 30 minutes spent preparing this discharge summary

## 2014-07-06 DIAGNOSIS — I959 Hypotension, unspecified: Secondary | ICD-10-CM

## 2014-07-06 DIAGNOSIS — Z5189 Encounter for other specified aftercare: Secondary | ICD-10-CM

## 2014-07-06 DIAGNOSIS — N186 End stage renal disease: Secondary | ICD-10-CM

## 2014-07-06 DIAGNOSIS — Z471 Aftercare following joint replacement surgery: Secondary | ICD-10-CM

## 2014-07-13 ENCOUNTER — Encounter: Payer: Self-pay | Admitting: *Deleted

## 2014-07-16 ENCOUNTER — Other Ambulatory Visit (HOSPITAL_COMMUNITY): Payer: Self-pay | Admitting: Internal Medicine

## 2014-07-16 DIAGNOSIS — I6350 Cerebral infarction due to unspecified occlusion or stenosis of unspecified cerebral artery: Secondary | ICD-10-CM

## 2014-07-18 ENCOUNTER — Ambulatory Visit (HOSPITAL_COMMUNITY)
Admission: RE | Admit: 2014-07-18 | Discharge: 2014-07-18 | Disposition: A | Discharge status: Home / Self Care | Payer: Medicare Other | Source: Ambulatory Visit | Attending: Internal Medicine | Admitting: Internal Medicine

## 2014-07-18 ENCOUNTER — Ambulatory Visit (HOSPITAL_COMMUNITY): Payer: Medicare Other

## 2014-07-18 DIAGNOSIS — I6350 Cerebral infarction due to unspecified occlusion or stenosis of unspecified cerebral artery: Secondary | ICD-10-CM | POA: Insufficient documentation

## 2014-07-18 DIAGNOSIS — M25519 Pain in unspecified shoulder: Secondary | ICD-10-CM | POA: Diagnosis not present

## 2014-07-24 ENCOUNTER — Encounter (HOSPITAL_COMMUNITY): Payer: Self-pay | Admitting: Emergency Medicine

## 2014-07-24 ENCOUNTER — Emergency Department (HOSPITAL_COMMUNITY)
Admission: EM | Admit: 2014-07-24 | Discharge: 2014-07-24 | Disposition: A | Discharge status: Home / Self Care | Payer: Medicare Other | Attending: Emergency Medicine | Admitting: Emergency Medicine

## 2014-07-24 DIAGNOSIS — M129 Arthropathy, unspecified: Secondary | ICD-10-CM | POA: Diagnosis not present

## 2014-07-24 DIAGNOSIS — Z992 Dependence on renal dialysis: Secondary | ICD-10-CM | POA: Diagnosis not present

## 2014-07-24 DIAGNOSIS — Z87891 Personal history of nicotine dependence: Secondary | ICD-10-CM | POA: Insufficient documentation

## 2014-07-24 DIAGNOSIS — K219 Gastro-esophageal reflux disease without esophagitis: Secondary | ICD-10-CM | POA: Diagnosis not present

## 2014-07-24 DIAGNOSIS — I2510 Atherosclerotic heart disease of native coronary artery without angina pectoris: Secondary | ICD-10-CM | POA: Insufficient documentation

## 2014-07-24 DIAGNOSIS — R143 Flatulence: Secondary | ICD-10-CM

## 2014-07-24 DIAGNOSIS — I120 Hypertensive chronic kidney disease with stage 5 chronic kidney disease or end stage renal disease: Secondary | ICD-10-CM | POA: Diagnosis not present

## 2014-07-24 DIAGNOSIS — K4090 Unilateral inguinal hernia, without obstruction or gangrene, not specified as recurrent: Secondary | ICD-10-CM | POA: Insufficient documentation

## 2014-07-24 DIAGNOSIS — T82598A Other mechanical complication of other cardiac and vascular devices and implants, initial encounter: Secondary | ICD-10-CM | POA: Diagnosis not present

## 2014-07-24 DIAGNOSIS — M109 Gout, unspecified: Secondary | ICD-10-CM | POA: Diagnosis not present

## 2014-07-24 DIAGNOSIS — Z7982 Long term (current) use of aspirin: Secondary | ICD-10-CM | POA: Insufficient documentation

## 2014-07-24 DIAGNOSIS — T82518A Breakdown (mechanical) of other cardiac and vascular devices and implants, initial encounter: Secondary | ICD-10-CM

## 2014-07-24 DIAGNOSIS — N186 End stage renal disease: Secondary | ICD-10-CM | POA: Insufficient documentation

## 2014-07-24 DIAGNOSIS — R141 Gas pain: Secondary | ICD-10-CM | POA: Insufficient documentation

## 2014-07-24 DIAGNOSIS — IMO0002 Reserved for concepts with insufficient information to code with codable children: Secondary | ICD-10-CM | POA: Insufficient documentation

## 2014-07-24 DIAGNOSIS — R142 Eructation: Secondary | ICD-10-CM

## 2014-07-24 DIAGNOSIS — Z79899 Other long term (current) drug therapy: Secondary | ICD-10-CM | POA: Diagnosis not present

## 2014-07-24 DIAGNOSIS — Z8781 Personal history of (healed) traumatic fracture: Secondary | ICD-10-CM | POA: Insufficient documentation

## 2014-07-24 DIAGNOSIS — Y832 Surgical operation with anastomosis, bypass or graft as the cause of abnormal reaction of the patient, or of later complication, without mention of misadventure at the time of the procedure: Secondary | ICD-10-CM | POA: Insufficient documentation

## 2014-07-24 DIAGNOSIS — Z85528 Personal history of other malignant neoplasm of kidney: Secondary | ICD-10-CM | POA: Diagnosis not present

## 2014-07-24 DIAGNOSIS — T8241XA Breakdown (mechanical) of vascular dialysis catheter, initial encounter: Secondary | ICD-10-CM

## 2014-07-24 NOTE — ED Notes (Signed)
Per EMS: L Groin cath, broken. Noticed on way to bathroom. Not currently bleeding.

## 2014-07-24 NOTE — ED Notes (Signed)
MD at bedside. 

## 2014-07-24 NOTE — ED Notes (Signed)
Paged Dr. Barbie Banner to [phone]

## 2014-07-24 NOTE — ED Provider Notes (Signed)
CSN: [id number]     Arrival date & time 07/24/14  1605 History   First MD Initiated Contact with Patient 07/24/14 1617     Chief Complaint  Patient presents with  . Vascular Access Problem     (Consider location/radiation/quality/duration/timing/severity/associated sxs/prior Treatment) HPI Comments: 68 y.o. male with a history of amyloid heart disease, chronic A. fib, end-stage renal disease (HD on MWF) and recent left hip fracture status post surgery in August of 2015 who presents with accidental displacement of his left groin vascular catheter. He states that he was pulling down his pants to use the bathroom around 2:30 today when he accidentally completely pulled out the vascular catheter. There was mild bleeding at the time but the wound is currently hemostatic and there is no evidence of underlying hematoma. He has no other complaints on exam and otherwise appears well. He denies any pain. He has not had similar symptoms previously. The severity of his issue is mild.  The history is provided by the patient.    Past Medical History  Diagnosis Date  . Hypotension   . Amyloid heart disease   . Cardiomyopathy   . Gout   . BPH (benign prostatic hypertrophy)   . ESRD (end stage renal disease) on dialysis   . Chronic atrial fibrillation 02/2003    not on anticoagulation  . Hypertension   . GERD (gastroesophageal reflux disease)   . Arthritis     ra  . Cancer     ca of kidney  . CAD (coronary artery disease)     cath 2004: 30% RCA, 30% LCx, 30% LAD, EF 45-50%   Past Surgical History  Procedure Laterality Date  . Cholecystectomy    . Hernia repair    . Av fistula placement    . Nephrectomy      left  . Colonoscopy  07/19/2012    Procedure: COLONOSCOPY;  Surgeon: Rogene Houston, MD;  [facility];  Service: Endoscopy;  Laterality: N/A;  730  . Hip arthroplasty Left 06/07/2014    Procedure: LEFT HIP HEMIARTHROPLASTY;  Surgeon: Mcarthur Rossetti, MD;  Location: [place];  Service: Orthopedics;  Laterality: Left;   Family History  Problem Relation Age of Onset  . Cancer Mother   . Bone cancer Father   . Heart failure Father    History  Substance Use Topics  . Smoking status: Former Smoker -- 0.25 packs/day for 2 years    Types: Cigarettes    Quit date: 09/25/1981  . Smokeless tobacco: Never Used  . Alcohol Use: No    Review of Systems  Constitutional: Negative for fever.  HENT: Negative for drooling and rhinorrhea.   Eyes: Negative for pain.  Respiratory: Negative for cough and shortness of breath.   Cardiovascular: Negative for chest pain and leg swelling.  Gastrointestinal: Negative for nausea, vomiting, abdominal pain and diarrhea.  Genitourinary: Negative for dysuria and hematuria.  Musculoskeletal: Negative for gait problem and neck pain.  Skin: Negative for color change.  Neurological: Negative for numbness and headaches.  Hematological: Negative for adenopathy.  Psychiatric/Behavioral: Negative for behavioral problems.  All other systems reviewed and are negative.     Allergies  Review of patient's allergies indicates no known allergies.  Home Medications   Prior to Admission medications   Medication Sig Start Date End Date Taking? Authorizing Provider  acetaminophen (TYLENOL) 325 MG tablet Take 2 tablets (650 mg total) by mouth every 6 (six) hours as needed for mild pain (  or Fever >/= 101). 05/15/14   Kathie Dike, MD  allopurinol (ZYLOPRIM) 100 MG tablet Take 100 mg by mouth daily.    Historical Provider, MD  aspirin 81 MG tablet Take 81 mg by mouth daily.    Historical Provider, MD  cinacalcet (SENSIPAR) 90 MG tablet Take 30 mg by mouth daily. With evening meal    Historical Provider, MD  HYDROcodone-acetaminophen (NORCO/VICODIN) 5-325 MG per tablet Take one to two tablets by mouth every 4 hours as needed for moderate pain 06/13/14   Blanchie Serve, MD  metoCLOPramide (REGLAN) 10 MG tablet Take 5 mg by mouth 3 (three) times  daily before meals.  06/27/12   Historical Provider, MD  midodrine (PROAMATINE) 10 MG tablet Take 10 mg by mouth 3 (three) times daily.  12/03/11   Historical Provider, MD  multivitamin (RENA-VIT) TABS tablet Take 1 tablet by mouth daily.    Historical Provider, MD  omeprazole (PRILOSEC) 20 MG capsule Take 40 mg by mouth 2 (two) times daily before a meal.     Historical Provider, MD  polyethylene glycol (MIRALAX / GLYCOLAX) packet Take 17 g by mouth daily. 06/11/14   Domenic Polite, MD  senna-docusate (SENOKOT-S) 8.6-50 MG per tablet Take 1 tablet by mouth 2 (two) times daily. 06/11/14   Domenic Polite, MD  sevelamer (RENVELA) 800 MG tablet Take 3,200 mg by mouth 3 (three) times daily with meals. 3200 mg (4 tablets) with meals and 800 mg (1 tablet) with snacks    Historical Provider, MD  Venlafaxine HCl 75 MG TB24 Take 75 mg by mouth daily.     Historical Provider, MD   BP 75/57  Pulse 87  Temp(Src) 98.1 F (36.7 C) (Oral)  Resp 18  SpO2 94% Physical Exam  Nursing note and vitals reviewed. Constitutional: He is oriented to person, place, and time. He appears well-developed and well-nourished.  HENT:  Head: Normocephalic and atraumatic.  Right Ear: External ear normal.  Left Ear: External ear normal.  Nose: Nose normal.  Mouth/Throat: Oropharynx is clear and moist. No oropharyngeal exudate.  Eyes: Conjunctivae and EOM are normal. Pupils are equal, round, and reactive to light.  Neck: Normal range of motion. Neck supple.  Cardiovascular: Normal rate, regular rhythm, normal heart sounds and intact distal pulses.  Exam reveals no gallop and no friction rub.   No murmur heard. Pulmonary/Chest: Effort normal and breath sounds normal. No respiratory distress. He has no wheezes.  Abdominal: Soft. Bowel sounds are normal. He exhibits distension (mild to mod distension unchanged from baseline). There is no tenderness. There is no rebound and no guarding.  Genitourinary:  Bilateral inguinal hernias  without tenderness and unchanged from baseline. Wound to left groin where the vascular catheter was removed. No underlying hematoma noted. The wound is hemostatic.  Musculoskeletal: Normal range of motion. He exhibits no edema and no tenderness.  Neurological: He is alert and oriented to person, place, and time.  Skin: Skin is warm and dry.  Psychiatric: He has a normal mood and affect. His behavior is normal.    ED Course  Procedures (including critical care time) Labs Review Labs Reviewed - No data to display  Imaging Review No results found.   EKG Interpretation None      MDM   Final diagnoses:  Vascular catheter dysfunction, initial encounter    4:37 PM 68 y.o. male with a history of amyloid heart disease, chronic A. fib, end-stage renal disease (HD on MWF) and recent left hip fracture status post  surgery in August of 2015 who presents with accidental displacement of his left groin vascular catheter. He states that he was pulling down his pants to use the bathroom around 2:30 today when he accidentally completely pulled out the vascular catheter. There was mild bleeding at the time but the wound is currently hemostatic and there is no evidence of underlying hematoma. He has no other complaints on exam and otherwise appears well. I touched base with his nephrologist, Dr. Lowanda Foster who will notify the dialysis center that he will not get dialysis tomorrow and I will try to arrange interventional radiology to replace the vascular catheter. His nephrologist recommended the left groin for placement. His nephrologist reports that he had previous placement in the right infraclavicular area which became infected and also the right groin which clotted.  I spoke with the interventional radiologist on call who recommends ordering the procedure for tomorrow and having the patient call at 7:30 AM. I will tell the patient to be n.p.o. at midnight. Will have pt bring the catheter that was  accidentally removed w/ him tomorrow.   5:55 PM: Pt has low BP here, but has hx of this which I see on previous visits and admissions. The family notes this as well.  I have discussed the diagnosis/risks/treatment options with the patient and family and believe the pt to be eligible for discharge home to follow-up with tomorrow for vasc access placement tomorrow. We also discussed returning to the ED immediately if new or worsening sx occur. We discussed the sx which are most concerning (e.g., bleeding from the site) that necessitate immediate return. Medications administered to the patient during their visit and any new prescriptions provided to the patient are listed below.  Medications given during this visit Medications - No data to display  New Prescriptions   No medications on file     Pamella Pert, MD 07/24/14 2311

## 2014-07-24 NOTE — ED Notes (Signed)
Pt caught tube for Dialysis cath on pants when going to the bathroom. Currently no bleeding. Dialysis MWF

## 2014-07-25 ENCOUNTER — Other Ambulatory Visit (HOSPITAL_COMMUNITY): Payer: Self-pay | Admitting: Emergency Medicine

## 2014-07-25 ENCOUNTER — Ambulatory Visit (HOSPITAL_COMMUNITY)
Admission: RE | Admit: 2014-07-25 | Discharge: 2014-07-25 | Disposition: A | Discharge status: Home / Self Care | Payer: Medicare Other | Source: Ambulatory Visit | Attending: Emergency Medicine | Admitting: Emergency Medicine

## 2014-07-25 ENCOUNTER — Encounter (HOSPITAL_COMMUNITY): Payer: Self-pay

## 2014-07-25 DIAGNOSIS — I120 Hypertensive chronic kidney disease with stage 5 chronic kidney disease or end stage renal disease: Secondary | ICD-10-CM | POA: Diagnosis not present

## 2014-07-25 DIAGNOSIS — Z85528 Personal history of other malignant neoplasm of kidney: Secondary | ICD-10-CM | POA: Diagnosis not present

## 2014-07-25 DIAGNOSIS — N186 End stage renal disease: Secondary | ICD-10-CM

## 2014-07-25 DIAGNOSIS — I959 Hypotension, unspecified: Secondary | ICD-10-CM | POA: Insufficient documentation

## 2014-07-25 DIAGNOSIS — I4891 Unspecified atrial fibrillation: Secondary | ICD-10-CM | POA: Diagnosis not present

## 2014-07-25 DIAGNOSIS — Z905 Acquired absence of kidney: Secondary | ICD-10-CM | POA: Insufficient documentation

## 2014-07-25 DIAGNOSIS — Z79899 Other long term (current) drug therapy: Secondary | ICD-10-CM | POA: Insufficient documentation

## 2014-07-25 DIAGNOSIS — M109 Gout, unspecified: Secondary | ICD-10-CM | POA: Insufficient documentation

## 2014-07-25 DIAGNOSIS — Z87891 Personal history of nicotine dependence: Secondary | ICD-10-CM | POA: Insufficient documentation

## 2014-07-25 DIAGNOSIS — K219 Gastro-esophageal reflux disease without esophagitis: Secondary | ICD-10-CM | POA: Diagnosis not present

## 2014-07-25 DIAGNOSIS — I2510 Atherosclerotic heart disease of native coronary artery without angina pectoris: Secondary | ICD-10-CM | POA: Diagnosis not present

## 2014-07-25 DIAGNOSIS — Z992 Dependence on renal dialysis: Secondary | ICD-10-CM | POA: Diagnosis not present

## 2014-07-25 DIAGNOSIS — I428 Other cardiomyopathies: Secondary | ICD-10-CM | POA: Diagnosis not present

## 2014-07-25 LAB — CBC
HCT: 29.9 % — ABNORMAL LOW (ref 39.0–52.0)
HEMOGLOBIN: 9.9 g/dL — AB (ref 13.0–17.0)
MCH: 31.2 pg (ref 26.0–34.0)
MCHC: 33.1 g/dL (ref 30.0–36.0)
MCV: 94.3 fL (ref 78.0–100.0)
Platelets: 206 10*3/uL (ref 150–400)
RBC: 3.17 MIL/uL — ABNORMAL LOW (ref 4.22–5.81)
RDW: 14.7 % (ref 11.5–15.5)
WBC: 5.5 10*3/uL (ref 4.0–10.5)

## 2014-07-25 LAB — PROTIME-INR
INR: 1.36 (ref 0.00–1.49)
Prothrombin Time: 16.8 seconds — ABNORMAL HIGH (ref 11.6–15.2)

## 2014-07-25 MED ORDER — FENTANYL CITRATE 0.05 MG/ML IJ SOLN
INTRAMUSCULAR | Status: AC
  Filled 2014-07-25: qty 2

## 2014-07-25 MED ORDER — LIDOCAINE HCL 1 % IJ SOLN
INTRAMUSCULAR | Status: AC
  Filled 2014-07-25: qty 20

## 2014-07-25 MED ORDER — MIDAZOLAM HCL 2 MG/2ML IJ SOLN
INTRAMUSCULAR | Status: AC
  Filled 2014-07-25: qty 2

## 2014-07-25 MED ORDER — HEPARIN SODIUM (PORCINE) 1000 UNIT/ML IJ SOLN
INTRAMUSCULAR | Status: AC
  Filled 2014-07-25: qty 1

## 2014-07-25 MED ORDER — CEFAZOLIN (ANCEF) 1 G IV SOLR
2.0000 g | INTRAVENOUS | Status: DC
  Administered 2014-07-25: 2 g

## 2014-07-25 MED ORDER — CEFAZOLIN SODIUM-DEXTROSE 2-3 GM-% IV SOLR
INTRAVENOUS | Status: AC
  Filled 2014-07-25: qty 50

## 2014-07-25 MED ORDER — MIDAZOLAM HCL 2 MG/2ML IJ SOLN
INTRAMUSCULAR | Status: AC | PRN
  Administered 2014-07-25: 0.5 mg via INTRAVENOUS

## 2014-07-25 MED ORDER — FENTANYL CITRATE 0.05 MG/ML IJ SOLN
INTRAMUSCULAR | Status: AC | PRN
  Administered 2014-07-25: 25 ug via INTRAVENOUS

## 2014-07-25 NOTE — Sedation Documentation (Signed)
HD tunneled Cath sutured inplace [facility]) and dressed with Dermabon and tegaderm over site.

## 2014-07-25 NOTE — Sedation Documentation (Signed)
Dr. Earleen Newport in IR procedure room discussing procedure w/ pt.

## 2014-07-25 NOTE — Procedures (Signed)
Interventional Radiology Procedure Note  Procedure: US guided Right subclavian vein 23cm split-tip HD catheter.  Tip at the cavoatrial junction Findings: [place] survey of the right IJ and the left CFV show occlusion.  Patent Rt subclavian vein.  Complications: No immediate Recommendations:  HD catheter ready to use. Observe 30 minute to 60 minutes  Signed,  Merie Wulf S. Earleen Newport, DO

## 2014-07-25 NOTE — H&P (Signed)
Chief Complaint: "My dialysis catheter came out" Referring Physician:Befekadu HPI: [name] is an 68 y.o. male with ESRD. Has had multiple access sites in the past. Has thrombosed and nonfunctional HERO catheter on (L). Previously had (R)IJ PC a few months ago which had to be removed due to "infection and clot" Last HD access was was a femoral HD cath, which was accidentally pulled out last pm. Had HD on Monday. Per chart, pt receives Coumadin M-W-F with his HD. Per pt and family, infection is resolved. Needs new HD cath placement today. PMHx, meds, labs reviewed.  Past Medical History:  Past Medical History  Diagnosis Date  . Hypotension   . Amyloid heart disease   . Cardiomyopathy   . Gout   . BPH (benign prostatic hypertrophy)   . ESRD (end stage renal disease) on dialysis   . Chronic atrial fibrillation 02/2003    not on anticoagulation  . Hypertension   . GERD (gastroesophageal reflux disease)   . Arthritis     ra  . Cancer     ca of kidney  . CAD (coronary artery disease)     cath 2004: 30% RCA, 30% LCx, 30% LAD, EF 45-50%    Past Surgical History:  Past Surgical History  Procedure Laterality Date  . Cholecystectomy    . Hernia repair    . Av fistula placement    . Nephrectomy      left  . Colonoscopy  07/19/2012    Procedure: COLONOSCOPY;  Surgeon: Rogene Houston, MD;  [facility];  Service: Endoscopy;  Laterality: N/A;  730  . Hip arthroplasty Left 06/07/2014    Procedure: LEFT HIP HEMIARTHROPLASTY;  Surgeon: Mcarthur Rossetti, MD;  Location: [place];  Service: Orthopedics;  Laterality: Left;    Family History:  Family History  Problem Relation Age of Onset  . Cancer Mother   . Bone cancer Father   . Heart failure Father     Social History:  reports that he quit smoking about 32 years ago. His smoking use included Cigarettes. He has a .5 pack-year smoking history. He has never used smokeless tobacco. He reports that he does not drink  alcohol or use illicit drugs.  Allergies: No Known Allergies  Medications:   Medication List    ASK your doctor about these medications       allopurinol 100 MG tablet  Commonly known as:  ZYLOPRIM  Take 100 mg by mouth daily.     cinacalcet 30 MG tablet  Commonly known as:  SENSIPAR  Take 30 mg by mouth daily.     HYDROcodone-acetaminophen 5-325 MG per tablet  Commonly known as:  NORCO/VICODIN  Take 1 tablet by mouth every 4 (four) hours as needed for moderate pain.     metoCLOPramide 10 MG tablet  Commonly known as:  REGLAN  Take 10 mg by mouth 3 (three) times daily before meals.     midodrine 10 MG tablet  Commonly known as:  PROAMATINE  Take 10 mg by mouth 3 (three) times daily.     multivitamin Tabs tablet  Take 1 tablet by mouth daily.     omeprazole 20 MG capsule  Commonly known as:  PRILOSEC  Take 20 mg by mouth 2 (two) times daily before a meal.     polyethylene glycol packet  Commonly known as:  MIRALAX / GLYCOLAX  Take 17 g by mouth daily as needed for moderate constipation.     sevelamer  carbonate 800 MG tablet  Commonly known as:  RENVELA  Take 1,600 mg by mouth 3 (three) times daily with meals.     Venlafaxine HCl 75 MG Tb24  Take 75 mg by mouth daily.     warfarin 5 MG tablet  Commonly known as:  COUMADIN  Take 5 mg by mouth 3 (three) times a week. On Monday, Wednesday and Friday when going to dyalasis        Please HPI for pertinent positives, otherwise complete 10 system ROS negative.  Physical Exam: BP 81/54  Pulse 65  SpO2 100% There is no weight on file to calculate BMI.   General Appearance:  Alert, cooperative, no distress, appears stated age  Head:  Normocephalic, without obvious abnormality, atraumatic  ENT: Unremarkable  Neck: Supple, symmetrical, trachea midline  Lungs:   Clear to auscultation bilaterally, no w/r/r, respirations unlabored without use of accessory muscles.  Chest Wall:  No tenderness or deformity  Heart:   Regular rate and rhythm, S1, S2 normal, no murmur, rub or gallop.  Abdomen:   Soft, non-tender, non distended.  Extremities: Palp left HERO.   Neurologic: Normal affect, no gross deficits.  Labs: Results for orders placed during the hospital encounter of 07/25/14 (from the past 48 hour(s))  CBC     Status: Abnormal   Collection Time    07/25/14 11:30 AM      Result Value Ref Range   WBC 5.5  4.0 - 10.5 K/uL   RBC 3.17 (*) 4.22 - 5.81 MIL/uL   Hemoglobin 9.9 (*) 13.0 - 17.0 g/dL   HCT 29.9 (*) 39.0 - 52.0 %   MCV 94.3  78.0 - 100.0 fL   MCH 31.2  26.0 - 34.0 pg   MCHC 33.1  30.0 - 36.0 g/dL   RDW 14.7  11.5 - 15.5 %   Platelets 206  150 - 400 K/uL  PROTIME-INR     Status: Abnormal   Collection Time    07/25/14 11:30 AM      Result Value Ref Range   Prothrombin Time 16.8 (*) 11.6 - 15.2 seconds   INR 1.36  0.00 - 1.49    Imaging: No results found.  Assessment/Plan ESRD without access INR ok at 1.3 Will attempt (R)IJ PC placement, but may need to place new femoral HD cath. Discussed with pt and family Consent signed in chart  Ascencion Dike PA-C 07/25/2014, 12:08 PM

## 2014-07-25 NOTE — Sedation Documentation (Signed)
Report given to Rich Fuchs, RN to take over.  Handed over 1.5mg  Versed and 38mcg Fentanyl.

## 2014-07-25 NOTE — Discharge Instructions (Signed)
Conscious Sedation °Sedation is the use of medicines to promote relaxation and relieve discomfort and anxiety. Conscious sedation is a type of sedation. Under conscious sedation you are less alert than normal but are still able to respond to instructions or stimulation. Conscious sedation is used during short medical and dental procedures. It is milder than deep sedation or general anesthesia and allows you to return to your regular activities sooner.  °LET YOUR HEALTH CARE PROVIDER KNOW ABOUT:  °· Any allergies you have. °· All medicines you are taking, including vitamins, herbs, eye drops, creams, and over-the-counter medicines. °· Use of steroids (by mouth or creams). °· Previous problems you or members of your family have had with the use of anesthetics. °· Any blood disorders you have. °· Previous surgeries you have had. °· Medical conditions you have. °· Possibility of pregnancy, if this applies. °· Use of cigarettes, alcohol, or illegal drugs. °RISKS AND COMPLICATIONS °Generally, this is a safe procedure. However, as with any procedure, problems can occur. Possible problems include: °· Oversedation. °· Trouble breathing on your own. You may need to have a breathing tube until you are awake and breathing on your own. °· Allergic reaction to any of the medicines used for the procedure. °BEFORE THE PROCEDURE °· You may have blood tests done. These tests can help show how well your kidneys and liver are working. They can also show how well your blood clots. °· A physical exam will be done.   °· Only take medicines as directed by your health care provider. You may need to stop taking medicines (such as blood thinners, aspirin, or nonsteroidal anti-inflammatory drugs) before the procedure.   °· Do not eat or drink at least 6 hours before the procedure or as directed by your health care provider. °· Arrange for a responsible adult, family member, or friend to take you home after the procedure. He or she should stay  with you for at least 24 hours after the procedure, until the medicine has worn off. °PROCEDURE  °· An intravenous (IV) catheter will be inserted into one of your veins. Medicine will be able to flow directly into your body through this catheter. You may be given medicine through this tube to help prevent pain and help you relax. °· The medical or dental procedure will be done. °AFTER THE PROCEDURE °· You will stay in a recovery area until the medicine has worn off. Your blood pressure and pulse will be checked.   °·  Depending on the procedure you had, you may be allowed to go home when you can tolerate liquids and your pain is under control. °Document Released: 07/07/2001 Document Revised: 10/17/2013 Document Reviewed: 06/19/2013 °Exit[employer]® Patient Information ©2015 [employer]. This information is not intended to replace advice given to you by your health care provider. Make sure you discuss any questions you have with your health care provider. ° °

## 2014-07-25 NOTE — Sedation Documentation (Signed)
Discussed frequent PVCs and Bp w/ Dr. Earleen Newport prior to procedure start.

## 2014-07-25 NOTE — Sedation Documentation (Signed)
Discussed Bp w/ Ascencion Dike, PA when he was in to have consent signed and discuss procedure w/ pt and family.  Order given only for 2G Ancef IV prior to procedure.

## 2014-07-25 NOTE — Sedation Documentation (Signed)
Patient resting quietly, o2 turned off to check R/A Sat.

## 2014-07-25 NOTE — Sedation Documentation (Signed)
Patient is resting comfortably. VSS. CO2 waveform still flat- 21. Pt awakens easily to name.

## 2014-08-21 NOTE — H&P (Signed)
  NTS SOAP Note  Vital Signs:  Vitals as of: 51/70/0174: Systolic 87: Diastolic 58: Heart Rate 98: Temp 98.67F: Height 13ft 8in: Weight 156Lbs 0 Ounces: BMI 23.72  BMI : 23.72 kg/m2  Subjective: This 68 year old male presents for of a right inguinal hernia.  Has been present for some,  but is increasingly causes him pain and discomfort.  No nausea,  vomiting.  Review of Symptoms:  Constitutional:unremarkable   Head:unremarkable Eyes:pain bilateral sinus,  ear,  and throat problems Cardiovascular:  unremarkable Respiratory:cough Gastrointestinal:  unremarkable   Genitourinary:unremarkable   joint pain Hematolgic/Lymphatic:unremarkable   Allergic/Immunologic:unremarkable   Past Medical History:  Reviewed  Past Medical History  Surgical History: dialysis fistula, permacath placement,  left hip surgery 2015 Medical Problems: CRF, HTN, gout Allergies: nkda Medications:  allopurinol, metoclopramide, renvela,  omeprazole,  sensipar,  effexor,  baby asa   Social History:Reviewed  Social History  Preferred Language: English ([place]) Race:  Black or African American Ethnicity: Not Hispanic / Latino Age: 61 Years 3 Months Marital Status:  M Alcohol:  No Recreational drug(s):  No   Smoking Status: Unknown if ever smoked reviewed on 08/21/2014 Functional Status reviewed on 08/21/2014 ------------------------------------------------ Bathing: Normal Cooking: Normal Dressing: Normal Driving: Normal Eating: Normal Managing Meds: Normal Oral Care: Normal Shopping: Normal Toileting: Normal Transferring: Disablilty - walker Walking: Disablilty - walker Cognitive Status reviewed on 08/21/2014 ------------------------------------------------ Attention: Normal Decision Making: Normal Language: Normal Memory: Normal Motor: Normal Perception: Normal Problem Solving: Normal Visual and Spatial: Normal   Family History:Reviewed  Family Health  History Mother, Unknown; Diabetes mellitus, unspecified type; Dementia;  Father     Objective Information: qd,  wn,  walks with walker  Heart:RRR, no murmur or gallop.  Normal S1, S2.  No S3, S4.  Lungs:  CTA bilaterally, no wheezes, rhonchi, rales.  Breathing unlabored. Abdomen:Soft, NT/ND, no HSM, no masses.  Reducible right inguinal hernia. Left left hydrocele  Assessment:Right inguinal hernia  Diagnoses: 550.90  K40.90 Inguinal hernia (Unilateral inguinal hernia, without obstruction or gangrene, not specified as recurrent)  Procedures: 99214 - OFFICE OUTPATIENT VISIT 25 MINUTES    Plan:  Scheduled for right inguinal herniorrhaphy with mesh on 09/13/14.   Patient Education:Alternative treatments to surgery were discussed with patient (and family).  Risks and benefits  of procedure including bleeding,  infection,  mesh use,  and recurrence of the hernia were fully explained to the patient (and family) who gave informed consent. Patient/family questions were addressed.  Follow-up:Pending Surgery

## 2014-09-07 NOTE — Patient Instructions (Signed)
[name]  09/07/2014   Your procedure is scheduled on:  09/13/2014  Report to [facility] at  58  AM.  Call this number if you have problems the morning of surgery: [phone]   Remember:   Do not eat food or drink liquids after midnight.   Take these medicines the morning of surgery with A SIP OF WATER:  Allopurinoal, cinacalcet, hydrocodone, reglan, midodrine, prilosec, sevelamer, venlafaxine   Do not wear jewelry, make-up or nail polish.  Do not wear lotions, powders, or perfumes.   Do not shave 48 hours prior to surgery. Men may shave face and neck.  Do not bring valuables to the hospital.  [facility] is not responsible for any belongings or valuables.               Contacts, dentures or bridgework may not be worn into surgery.  Leave suitcase in the car. After surgery it may be brought to your room.  For patients admitted to the hospital, discharge time is determined by your treatment team.               Patients discharged the day of surgery will not be allowed to drive home.  Name and phone number of your driver: family  Special Instructions: Shower using CHG 2 nights before surgery and the night before surgery.  If you shower the day of surgery use CHG.  Use special wash - you have one bottle of CHG for all showers.  You should use approximately 1/3 of the bottle for each shower.   Please read over the following fact sheets that you were given: Pain Booklet, Coughing and Deep Breathing, Surgical Site Infection Prevention, Anesthesia Post-op Instructions and Care and Recovery After Surgery Hernia A hernia occurs when an internal organ pushes out through a weak spot in the abdominal wall. Hernias most commonly occur in the groin and around the navel. Hernias often can be pushed back into place (reduced). Most hernias tend to get worse over time. Some abdominal hernias can get stuck in the opening (irreducible or incarcerated hernia) and cannot be reduced. An  irreducible abdominal hernia which is tightly squeezed into the opening is at risk for impaired blood supply (strangulated hernia). A strangulated hernia is a medical emergency. Because of the risk for an irreducible or strangulated hernia, surgery may be recommended to repair a hernia. CAUSES   Heavy lifting.  Prolonged coughing.  Straining to have a bowel movement.  A cut (incision) made during an abdominal surgery. HOME CARE INSTRUCTIONS   Bed rest is not required. You may continue your normal activities.  Avoid lifting more than 10 pounds (4.5 kg) or straining.  Cough gently. If you are a smoker it is best to stop. Even the best hernia repair can break down with the continual strain of coughing. Even if you do not have your hernia repaired, a cough will continue to aggravate the problem.  Do not wear anything tight over your hernia. Do not try to keep it in with an outside bandage or truss. These can damage abdominal contents if they are trapped within the hernia sac.  Eat a normal diet.  Avoid constipation. Straining over long periods of time will increase hernia size and encourage breakdown of repairs. If you cannot do this with diet alone, stool softeners may be used. SEEK IMMEDIATE MEDICAL CARE IF:   You have a fever.  You develop increasing abdominal pain.  You feel nauseous  or vomit.  Your hernia is stuck outside the abdomen, looks discolored, feels hard, or is tender.  You have any changes in your bowel habits or in the hernia that are unusual for you.  You have increased pain or swelling around the hernia.  You cannot push the hernia back in place by applying gentle pressure while lying down. MAKE SURE YOU:   Understand these instructions.  Will watch your condition.  Will get help right away if you are not doing well or get worse. Document Released: 10/12/2005 Document Revised: 01/04/2012 Document Reviewed: 05/31/2008 [facility] Patient Information 2015  [place], [place]. This information is not intended to replace advice given to you by your health care provider. Make sure you discuss any questions you have with your health care provider. PATIENT INSTRUCTIONS POST-ANESTHESIA  IMMEDIATELY FOLLOWING SURGERY:  Do not drive or operate machinery for the first twenty four hours after surgery.  Do not make any important decisions for twenty four hours after surgery or while taking narcotic pain medications or sedatives.  If you develop intractable nausea and vomiting or a severe headache please notify your doctor immediately.  FOLLOW-UP:  Please make an appointment with your surgeon as instructed. You do not need to follow up with anesthesia unless specifically instructed to do so.  WOUND CARE INSTRUCTIONS (if applicable):  Keep a dry clean dressing on the anesthesia/puncture wound site if there is drainage.  Once the wound has quit draining you may leave it open to air.  Generally you should leave the bandage intact for twenty four hours unless there is drainage.  If the epidural site drains for more than 36-48 hours please call the anesthesia department.  QUESTIONS?:  Please feel free to call your physician or the hospital operator if you have any questions, and they will be happy to assist you.

## 2014-09-10 ENCOUNTER — Encounter (HOSPITAL_COMMUNITY)
Admission: RE | Admit: 2014-09-10 | Discharge: 2014-09-10 | Disposition: A | Discharge status: Home / Self Care | Payer: Medicare Other | Source: Ambulatory Visit | Attending: General Surgery | Admitting: General Surgery

## 2014-09-13 ENCOUNTER — Encounter (HOSPITAL_COMMUNITY): Admission: RE | Payer: Self-pay | Source: Ambulatory Visit

## 2014-09-13 ENCOUNTER — Ambulatory Visit (HOSPITAL_COMMUNITY): Admission: RE | Admit: 2014-09-13 | Payer: Medicare Other | Source: Ambulatory Visit | Admitting: General Surgery

## 2014-09-13 SURGERY — REPAIR, HERNIA, INGUINAL, ADULT
Anesthesia: General | Laterality: Right

## 2014-09-25 ENCOUNTER — Encounter (INDEPENDENT_AMBULATORY_CARE_PROVIDER_SITE_OTHER): Payer: Self-pay | Admitting: *Deleted

## 2014-10-10 ENCOUNTER — Ambulatory Visit (INDEPENDENT_AMBULATORY_CARE_PROVIDER_SITE_OTHER): Payer: Medicare Other | Admitting: Internal Medicine

## 2014-10-10 ENCOUNTER — Encounter (INDEPENDENT_AMBULATORY_CARE_PROVIDER_SITE_OTHER): Payer: Self-pay | Admitting: *Deleted

## 2014-10-10 ENCOUNTER — Other Ambulatory Visit (INDEPENDENT_AMBULATORY_CARE_PROVIDER_SITE_OTHER): Payer: Self-pay | Admitting: *Deleted

## 2014-10-10 ENCOUNTER — Encounter (INDEPENDENT_AMBULATORY_CARE_PROVIDER_SITE_OTHER): Payer: Self-pay | Admitting: Internal Medicine

## 2014-10-10 VITALS — BP 90/64 | HR 80 | Temp 98.3°F | Ht 68.0 in | Wt 145.1 lb

## 2014-10-10 DIAGNOSIS — K625 Hemorrhage of anus and rectum: Secondary | ICD-10-CM

## 2014-10-10 DIAGNOSIS — R195 Other fecal abnormalities: Secondary | ICD-10-CM

## 2014-10-10 NOTE — Patient Instructions (Signed)
Colonoscopy. The risks and benefits such as perforation, bleeding, and infection were reviewed with the patient and is agreeable.The risks and benefits such as perforation, bleeding, and infection were reviewed with the patient and is agreeable. 

## 2014-10-10 NOTE — Progress Notes (Addendum)
Subjective:    Patient ID: [name], male    DOB: 1946-02-23, 68 y.o.   MRN: [id number]  HPI 68 yr old male presents today with c/o rectal bleeding. He says he see blood on the toilet tissue. Bleeding started in July. His BMs are brown in color. He denies any abdominal pain. This last time he saw blood was yesterday. He says there has been no change in his BMs.  He usually has a BM twice a day.  Appetite is good. He has lost 12 pounds since his last visit last year. Hx of tubular adenomas including a large one from the sigmoid colon (snared on colonoscopy in 2013). Dialysis patient x 10 yrs. Dialysis M-W-F Recently at [facility] for infected dialysis catheter which was placed in his neck.  Admitted and given IV antibiotics.  Dialysis cath apparently removed and placed in left groin. Wife states the catheter became misplaced.  Rt subclavian HD catheter was inserted at [facility] in September.   Recent hip fracture in July of this after a fall. Rehab at [facility] x 1 month.  Hx of cardiac amyloidosis and is followed at [facility] Dr. Carolynn Serve   07/19/2012 Colonoscopy with polypectomy:   Indications: Patient is 68 year old African American male with multiple medical problems who presents with intermittent hematochezia. He had large tubulovillous adenoma removed from his colon in 2000 exam in 2005 was unremarkable. Family history significant for colon carcinoma in his mother at age 65.  Impression:  Examination performed to cecum. Multiple diverticula at ascending colon with few at transverse colon. Small polyp ablated via cold biopsy from transverse colon. 15 mm polyp snared from sigmoid colon(this polyp may have been source of his hematochezia). 2mm polyp snared from rectum. 2 small rectal polyps were coagulated using snare tip.  All 3 polyps are tubular adenomas including a large one from sigmoid colon. Patient called and message/results left on answering service. Next  colonoscopy in 3 years Report to PCP   Review of Systems     Past Medical History  Diagnosis Date  . Hypotension   . Amyloid heart disease   . Cardiomyopathy   . Gout   . BPH (benign prostatic hypertrophy)   . ESRD (end stage renal disease) on dialysis   . Chronic atrial fibrillation 02/2003    not on anticoagulation  . Hypertension   . GERD (gastroesophageal reflux disease)   . Arthritis     ra  . Cancer     ca of kidney  . CAD (coronary artery disease)     cath 2004: 30% RCA, 30% LCx, 30% LAD, EF 45-50%    Past Surgical History  Procedure Laterality Date  . Cholecystectomy    . Hernia repair    . Av fistula placement    . Nephrectomy      left  . Colonoscopy  07/19/2012    Procedure: COLONOSCOPY;  Surgeon: Rogene Houston, MD;  [facility];  Service: Endoscopy;  Laterality: N/A;  730  . Hip arthroplasty Left 06/07/2014    Procedure: LEFT HIP HEMIARTHROPLASTY;  Surgeon: Mcarthur Rossetti, MD;  Location: [place];  Service: Orthopedics;  Laterality: Left;    No Known Allergies  Current Outpatient Prescriptions on File Prior to Visit  Medication Sig Dispense Refill  . allopurinol (ZYLOPRIM) 100 MG tablet Take 100 mg by mouth daily.    . cinacalcet (SENSIPAR) 30 MG tablet Take 30 mg by mouth daily.    Marland Kitchen  midodrine (PROAMATINE) 10 MG tablet Take 10 mg by mouth 3 (three) times daily.     . multivitamin (RENA-VIT) TABS tablet Take 1 tablet by mouth daily.    Marland Kitchen omeprazole (PRILOSEC) 20 MG capsule Take 20 mg by mouth 2 (two) times daily before a meal.     . polyethylene glycol (MIRALAX / GLYCOLAX) packet Take 17 g by mouth daily as needed for moderate constipation.    . sevelamer (RENVELA) 800 MG tablet Take 1,600 mg by mouth 3 (three) times daily with meals.     . Venlafaxine HCl 75 MG TB24 Take 75 mg by mouth daily.      No current facility-administered medications on file prior to visit.     Objective:   Physical Exam Filed Vitals:   10/10/14 1508    Height: 5\' 8"  (1.727 m)  Weight: 145 lb 1.6 oz (65.817 kg)  Alert and oriented. Skin warm and dry. Oral mucosa is moist.   . Sclera anicteric, conjunctivae is pink. Thyroid not enlarged. No cervical lymphadenopathy. Lungs clear. Heart regular rate and rhythm.  Abdomen is soft. Bowel sounds are positive. No hepatomegaly. No abdominal masses felt. Abdomen is distended but not tense. Stool brown and grossly positive. ( His wife states his abdomen is always distended)  Rt subclavian catheter.      Assessment & Plan:  Guaiac positive stool. Hx of large polyp on last colonoscopy in 2013.  Colonic neoplasm needs to be ruled. Reoccurring colonic polyp is also in the differential.  Colonoscopy. CBC today

## 2014-10-11 LAB — CBC WITH DIFFERENTIAL/PLATELET
BASOS ABS: 0.1 10*3/uL (ref 0.0–0.1)
Basophils Relative: 1 % (ref 0–1)
EOS PCT: 9 % — AB (ref 0–5)
Eosinophils Absolute: 0.5 10*3/uL (ref 0.0–0.7)
HCT: 35.1 % — ABNORMAL LOW (ref 39.0–52.0)
Hemoglobin: 11.5 g/dL — ABNORMAL LOW (ref 13.0–17.0)
LYMPHS PCT: 18 % (ref 12–46)
Lymphs Abs: 1 10*3/uL (ref 0.7–4.0)
MCH: 30.5 pg (ref 26.0–34.0)
MCHC: 32.8 g/dL (ref 30.0–36.0)
MCV: 93.1 fL (ref 78.0–100.0)
MONO ABS: 0.3 10*3/uL (ref 0.1–1.0)
MONOS PCT: 6 % (ref 3–12)
MPV: 10.2 fL (ref 9.4–12.4)
NEUTROS ABS: 3.5 10*3/uL (ref 1.7–7.7)
Neutrophils Relative %: 66 % (ref 43–77)
PLATELETS: 251 10*3/uL (ref 150–400)
RBC: 3.77 MIL/uL — AB (ref 4.22–5.81)
RDW: 16.3 % — ABNORMAL HIGH (ref 11.5–15.5)
WBC: 5.3 10*3/uL (ref 4.0–10.5)

## 2014-10-16 ENCOUNTER — Other Ambulatory Visit (INDEPENDENT_AMBULATORY_CARE_PROVIDER_SITE_OTHER): Payer: Self-pay | Admitting: Internal Medicine

## 2014-10-16 ENCOUNTER — Ambulatory Visit (HOSPITAL_COMMUNITY)
Admission: RE | Admit: 2014-10-16 | Discharge: 2014-10-16 | Disposition: A | Discharge status: Home / Self Care | Payer: Medicare Other | Source: Ambulatory Visit | Attending: Internal Medicine | Admitting: Internal Medicine

## 2014-10-16 ENCOUNTER — Encounter (HOSPITAL_COMMUNITY)
Admission: RE | Disposition: A | Discharge status: Home / Self Care | Payer: Self-pay | Source: Ambulatory Visit | Attending: Internal Medicine

## 2014-10-16 ENCOUNTER — Encounter (HOSPITAL_COMMUNITY): Payer: Self-pay

## 2014-10-16 DIAGNOSIS — Z800 Family history of malignant neoplasm of digestive organs: Secondary | ICD-10-CM | POA: Insufficient documentation

## 2014-10-16 DIAGNOSIS — I482 Chronic atrial fibrillation: Secondary | ICD-10-CM | POA: Diagnosis not present

## 2014-10-16 DIAGNOSIS — I429 Cardiomyopathy, unspecified: Secondary | ICD-10-CM | POA: Insufficient documentation

## 2014-10-16 DIAGNOSIS — K644 Residual hemorrhoidal skin tags: Secondary | ICD-10-CM | POA: Diagnosis not present

## 2014-10-16 DIAGNOSIS — N186 End stage renal disease: Secondary | ICD-10-CM | POA: Insufficient documentation

## 2014-10-16 DIAGNOSIS — E854 Organ-limited amyloidosis: Secondary | ICD-10-CM | POA: Insufficient documentation

## 2014-10-16 DIAGNOSIS — I2510 Atherosclerotic heart disease of native coronary artery without angina pectoris: Secondary | ICD-10-CM | POA: Diagnosis not present

## 2014-10-16 DIAGNOSIS — Z87891 Personal history of nicotine dependence: Secondary | ICD-10-CM | POA: Insufficient documentation

## 2014-10-16 DIAGNOSIS — K219 Gastro-esophageal reflux disease without esophagitis: Secondary | ICD-10-CM | POA: Diagnosis not present

## 2014-10-16 DIAGNOSIS — Z86010 Personal history of colonic polyps: Secondary | ICD-10-CM

## 2014-10-16 DIAGNOSIS — K625 Hemorrhage of anus and rectum: Secondary | ICD-10-CM

## 2014-10-16 DIAGNOSIS — Z992 Dependence on renal dialysis: Secondary | ICD-10-CM | POA: Diagnosis not present

## 2014-10-16 DIAGNOSIS — I120 Hypertensive chronic kidney disease with stage 5 chronic kidney disease or end stage renal disease: Secondary | ICD-10-CM | POA: Insufficient documentation

## 2014-10-16 DIAGNOSIS — M109 Gout, unspecified: Secondary | ICD-10-CM | POA: Diagnosis not present

## 2014-10-16 DIAGNOSIS — K5730 Diverticulosis of large intestine without perforation or abscess without bleeding: Secondary | ICD-10-CM | POA: Insufficient documentation

## 2014-10-16 DIAGNOSIS — R188 Other ascites: Secondary | ICD-10-CM

## 2014-10-16 DIAGNOSIS — R195 Other fecal abnormalities: Secondary | ICD-10-CM | POA: Diagnosis present

## 2014-10-16 DIAGNOSIS — K6389 Other specified diseases of intestine: Secondary | ICD-10-CM

## 2014-10-16 DIAGNOSIS — K7469 Other cirrhosis of liver: Secondary | ICD-10-CM

## 2014-10-16 DIAGNOSIS — Z7982 Long term (current) use of aspirin: Secondary | ICD-10-CM | POA: Insufficient documentation

## 2014-10-16 DIAGNOSIS — K649 Unspecified hemorrhoids: Secondary | ICD-10-CM

## 2014-10-16 HISTORY — PX: COLONOSCOPY: SHX5424

## 2014-10-16 SURGERY — COLONOSCOPY
Anesthesia: Moderate Sedation

## 2014-10-16 MED ORDER — SODIUM CHLORIDE 0.9 % IV SOLN
INTRAVENOUS | Status: DC
  Administered 2014-10-16: 12:00:00 via INTRAVENOUS

## 2014-10-16 MED ORDER — DEXTROSE 5 % IV SOLN: 1.0000 g | Freq: Once | INTRAVENOUS | Status: DC

## 2014-10-16 MED ORDER — MIDAZOLAM HCL 5 MG/5ML IJ SOLN
INTRAMUSCULAR | Status: AC
  Filled 2014-10-16: qty 10

## 2014-10-16 MED ORDER — CEFAZOLIN SODIUM-DEXTROSE 2-3 GM-% IV SOLR: 2.0000 g | INTRAVENOUS | Status: DC

## 2014-10-16 MED ORDER — CHLORHEXIDINE GLUCONATE 4 % EX LIQD: 1.0000 "application " | Freq: Once | CUTANEOUS | Status: DC

## 2014-10-16 MED ORDER — MIDAZOLAM HCL 5 MG/5ML IJ SOLN
INTRAMUSCULAR | Status: DC | PRN
  Administered 2014-10-16 (×3): 1 mg via INTRAVENOUS

## 2014-10-16 MED ORDER — MEPERIDINE HCL 50 MG/ML IJ SOLN
INTRAMUSCULAR | Status: AC
  Filled 2014-10-16: qty 1

## 2014-10-16 MED ORDER — DEXTROSE 5 % IV SOLN
INTRAVENOUS | Status: AC
  Filled 2014-10-16: qty 10

## 2014-10-16 MED ORDER — CEFTRIAXONE SODIUM 1 G IJ SOLR
INTRAMUSCULAR | Status: DC | PRN
  Administered 2014-10-16: 1 g via INTRAMUSCULAR

## 2014-10-16 NOTE — H&P (Signed)
[name] is an 68 y.o. male.   Chief Complaint: Patient is here for colonoscopy. 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 He had large tubulovillous adenoma removed back in 2001 and more recently he had multiple adenomas removed in September 2013. He now presents with intermittent hematochezia and has heme positive stool. He says he's been seen blood with his bowels for about 6 months. He denies abdominal pain but reports progressive abdominal distention. He had abdominal paracenteses in July 2015 with removal of 4.6 L of fluid. Cultures and cytology were negative. There is no history of liver disease that he knows about. Family history significant for CRC in mother who was in her early 36s at the time of diagnosis and lived to be 29.  Past Medical History  Diagnosis Date  . Hypotension   . Amyloid heart disease   . Cardiomyopathy   . Gout   . BPH (benign prostatic hypertrophy)   . Chronic atrial fibrillation 02/2003    not on anticoagulation  . Hypertension   . GERD (gastroesophageal reflux disease)   . Arthritis     ra  . Cancer     ca of kidney  . CAD (coronary artery disease)     cath 2004: 30% RCA, 30% LCx, 30% LAD, EF 45-50%  . ESRD (end stage renal disease) on dialysis     Dialysis Monday , Wednesday and Fridays    Past Surgical History  Procedure Laterality Date  . Cholecystectomy    . Hernia repair    . Av fistula placement    . Nephrectomy      left  . Colonoscopy  07/19/2012    Procedure: COLONOSCOPY;  Surgeon: Rogene Houston, MD;  [facility];  Service: Endoscopy;  Laterality: N/A;  730  . Hip arthroplasty Left 06/07/2014    Procedure: LEFT HIP HEMIARTHROPLASTY;  Surgeon: Mcarthur Rossetti, MD;  Location: [place];  Service: Orthopedics;  Laterality: Left;    Family History  Problem Relation Age of Onset  .  Cancer Mother   . Bone cancer Father   . Heart failure Father    Social History:  reports that he quit smoking about 33 years ago. His smoking use included Cigarettes. He has a .5 pack-year smoking history. He has never used smokeless tobacco. He reports that he does not drink alcohol or use illicit drugs.  Allergies: No Known Allergies  Medications Prior to Admission  Medication Sig Dispense Refill  . allopurinol (ZYLOPRIM) 100 MG tablet Take 100 mg by mouth daily.    Marland Kitchen aspirin 81 MG tablet Take 81 mg by mouth daily.    Marland Kitchen b complex-vitamin c-folic acid (NEPHRO-VITE) 0.8 MG TABS tablet Take 1 tablet by mouth at bedtime.    . cinacalcet (SENSIPAR) 30 MG tablet Take 30 mg by mouth daily.    . midodrine (PROAMATINE) 10 MG tablet Take 10 mg by mouth 3 (three) times daily.     . multivitamin (RENA-VIT) TABS tablet Take 1 tablet by mouth daily.    Marland Kitchen omeprazole (PRILOSEC) 20 MG capsule Take 20 mg by mouth 2 (two) times daily before a meal.     . polyethylene glycol (MIRALAX / GLYCOLAX) packet Take 17 g by mouth daily as needed for moderate constipation.    . sevelamer (RENVELA) 800 MG tablet Take 1,600 mg by mouth 3 (three) times daily with meals.     Marland Kitchen  Venlafaxine HCl 75 MG TB24 Take 75 mg by mouth daily.     Marland Kitchen ibuprofen (ADVIL,MOTRIN) 200 MG tablet Take 200 mg by mouth every 6 (six) hours as needed.      No results found for this or any previous visit (from the past 48 hour(s)). No results found.  ROS  Blood pressure 79/54, pulse 87, temperature 98.1 F (36.7 C), temperature source Oral, resp. rate 22, height 5\' 8"  (1.727 m), weight 145 lb (65.772 kg), SpO2 99 %. Physical Exam  Constitutional:  Well-developed thin African-American male in NAD  HENT:  Mouth/Throat: Oropharynx is clear and moist.  Eyes: Conjunctivae are normal. No scleral icterus.  Neck: No thyromegaly present.  Cardiovascular: Normal rate, regular rhythm and normal heart sounds.   No murmur heard. Respiratory: Effort  normal.  Breath sounds diminished at both bases  GI:  Abdomen is distended and somewhat tense but nontender. Fluid thrill is present. No organomegaly or masses.  Musculoskeletal: He exhibits no edema.  Lymphadenopathy:    He has no cervical adenopathy.  Neurological: He is alert.  Skin: Skin is warm and dry.     Assessment/Plan Hematochezia and heme positive stool. History of multiple colonic adenomas. Patient has chronic hypertension. Exam pertinent for significant ascites. Diagnostic colonoscopy. Ceftriaxone 1 g IV 1 as patient at risk for SBP.  REHMAN,NAJEEB U 10/16/2014, 12:36 PM

## 2014-10-16 NOTE — Discharge Instructions (Signed)
Do not take Advil or Aleve or similar medications. Resume low-dose aspirin and other medications as before. High fiber diet. No driving for 24 hours. Upper abdominal ultrasound and abdominal tap to be scheduled.  Colonoscopy, Care After Refer to this sheet in the next few weeks. These instructions provide you with information on caring for yourself after your procedure. Your health care provider may also give you more specific instructions. Your treatment has been planned according to current medical practices, but problems sometimes occur. Call your health care provider if you have any problems or questions after your procedure. WHAT TO EXPECT AFTER THE PROCEDURE  After your procedure, it is typical to have the following:  A small amount of blood in your stool.  Moderate amounts of gas and mild abdominal cramping or bloating. HOME CARE INSTRUCTIONS  Do not drive, operate machinery, or sign important documents for 24 hours.  You may shower and resume your regular physical activities, but move at a slower pace for the first 24 hours.  Take frequent rest periods for the first 24 hours.  Walk around or put a warm pack on your abdomen to help reduce abdominal cramping and bloating.  Drink enough fluids to keep your urine clear or pale yellow.  You may resume your normal diet as instructed by your health care provider. Avoid heavy or fried foods that are hard to digest.  Avoid drinking alcohol for 24 hours or as instructed by your health care provider.  Only take over-the-counter or prescription medicines as directed by your health care provider.  If a tissue sample (biopsy) was taken during your procedure:  Do not take aspirin or blood thinners for 7 days, or as instructed by your health care provider.  Do not drink alcohol for 7 days, or as instructed by your health care provider.  Eat soft foods for the first 24 hours. SEEK MEDICAL CARE IF: You have persistent spotting of blood  in your stool 2-3 days after the procedure. SEEK IMMEDIATE MEDICAL CARE IF:  You have more than a small spotting of blood in your stool.  You pass large blood clots in your stool.  Your abdomen is swollen (distended).  You have nausea or vomiting.  You have a fever.  You have increasing abdominal pain that is not relieved with medicine. Document Released: 05/26/2004 Document Revised: 08/02/2013 Document Reviewed: 06/19/2013 [facility] Patient Information 2015 [place], [place]. This information is not intended to replace advice given to you by your health care provider. Make sure you discuss any questions you have with your health care provider.  High-Fiber Diet Fiber is found in fruits, vegetables, and grains. A high-fiber diet encourages the addition of more whole grains, legumes, fruits, and vegetables in your diet. The recommended amount of fiber for adult males is 38 g per day. For adult females, it is 25 g per day. Pregnant and lactating women should get 28 g of fiber per day. If you have a digestive or bowel problem, ask your caregiver for advice before adding high-fiber foods to your diet. Eat a variety of high-fiber foods instead of only a select few type of foods.  PURPOSE  To increase stool bulk.  To make bowel movements more regular to prevent constipation.  To lower cholesterol.  To prevent overeating. WHEN IS THIS DIET USED?  It may be used if you have constipation and hemorrhoids.  It may be used if you have uncomplicated diverticulosis (intestine condition) and irritable bowel syndrome.  It may be used  if you need help with weight management.  It may be used if you want to add it to your diet as a protective measure against atherosclerosis, diabetes, and cancer. SOURCES OF FIBER  Whole-grain breads and cereals.  Fruits, such as apples, oranges, bananas, berries, prunes, and pears.  Vegetables, such as green peas, carrots, sweet potatoes, beets, broccoli,  cabbage, spinach, and artichokes.  Legumes, such split peas, soy, lentils.  Almonds. FIBER CONTENT IN FOODS Starches and Grains / Dietary Fiber (g)  Cheerios, 1 cup / 3 g  Corn Flakes cereal, 1 cup / 0.7 g  Rice crispy treat cereal, 1 cup / 0.3 g  Instant oatmeal (cooked),  cup / 2 g  Frosted wheat cereal, 1 cup / 5.1 g  Brown, long-grain rice (cooked), 1 cup / 3.5 g  White, long-grain rice (cooked), 1 cup / 0.6 g  Enriched macaroni (cooked), 1 cup / 2.5 g Legumes / Dietary Fiber (g)  Baked beans (canned, plain, or vegetarian),  cup / 5.2 g  Kidney beans (canned),  cup / 6.8 g  Pinto beans (cooked),  cup / 5.5 g Breads and Crackers / Dietary Fiber (g)  Plain or honey graham crackers, 2 squares / 0.7 g  Saltine crackers, 3 squares / 0.3 g  Plain, salted pretzels, 10 pieces / 1.8 g  Whole-wheat bread, 1 slice / 1.9 g  White bread, 1 slice / 0.7 g  Raisin bread, 1 slice / 1.2 g  Plain bagel, 3 oz / 2 g  Flour tortilla, 1 oz / 0.9 g  Corn tortilla, 1 small / 1.5 g  Hamburger or hotdog bun, 1 small / 0.9 g Fruits / Dietary Fiber (g)  Apple with skin, 1 medium / 4.4 g  Sweetened applesauce,  cup / 1.5 g  Banana,  medium / 1.5 g  Grapes, 10 grapes / 0.4 g  Orange, 1 small / 2.3 g  Raisin, 1.5 oz / 1.6 g  Melon, 1 cup / 1.4 g Vegetables / Dietary Fiber (g)  Green beans (canned),  cup / 1.3 g  Carrots (cooked),  cup / 2.3 g  Broccoli (cooked),  cup / 2.8 g  Peas (cooked),  cup / 4.4 g  Mashed potatoes,  cup / 1.6 g  Lettuce, 1 cup / 0.5 g  Corn (canned),  cup / 1.6 g  Tomato,  cup / 1.1 g Document Released: 10/12/2005 Document Revised: 04/12/2012 Document Reviewed: 01/14/2012 [employer] Patient Information 2015 [place], [place]. This information is not intended to replace advice given to you by your health care provider. Make sure you discuss any questions you have with your health care provider.

## 2014-10-16 NOTE — Op Note (Signed)
COLONOSCOPY PROCEDURE REPORT  PATIENT:  [name]  MR#:  [id number] Birthdate:  April 23, 1946, 68 y.o., male Endoscopist:  Dr. Rogene Houston, MD Referred By:  Dr. 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 Family history significant for colon carcinoma in his mother who was in her early 4s of the time of diagnosis and died at 34 of unrelated causes. Patient's exam is pertinent for low blood pressure which is chronic and ascites.  Informed Consent:  The procedure and risks were reviewed with the patient and informed consent was obtained.  Medications:  Versed 3mg  IV  Description of procedure:  After a digital rectal exam was performed, that colonoscope was advanced from the anus through the rectum and colon to the area of the cecum, ileocecal valve and appendiceal orifice. The cecum was deeply intubated. These structures were well-seen and photographed for the record. From the level of the cecum and ileocecal valve, the scope was slowly and cautiously withdrawn. The mucosal surfaces were carefully surveyed utilizing scope tip to flexion to facilitate fold flattening as needed. The scope was pulled down into the rectum where a thorough exam including retroflexion was performed.  Findings:   Prep excellent. Scattered diverticula are located at ascending colon, hepatic flexure and sigmoid colon. More or less diffuse edema to colonic mucosa. Normal rectal mucosa. Small hemorrhoids below the dentate line.    Therapeutic/Diagnostic Maneuvers Performed:   None  Complications:  None  Cecal Withdrawal Time:  4  minutes  Impression:  Examination  performed to cecum. Scattered right-sided and sigmoid diverticulosis. Portal colopathy(diffuse colonic mucosal edema). External hemorrhoids.  Recommendations:  Standard instructions given. Patient advised to discontinue Advil. Will schedule patient for abdominal ultrasound and abdominal paracenteses in near future.  REHMAN,NAJEEB U  10/16/2014 1:10 PM  CC: Dr. Glo Herring., MD & Dr. Rayne Du ref. provider found

## 2014-10-17 ENCOUNTER — Encounter (HOSPITAL_COMMUNITY): Payer: Self-pay | Admitting: Internal Medicine

## 2014-10-23 ENCOUNTER — Encounter (HOSPITAL_COMMUNITY): Payer: Self-pay

## 2014-10-23 ENCOUNTER — Ambulatory Visit (HOSPITAL_COMMUNITY)
Admission: RE | Admit: 2014-10-23 | Discharge: 2014-10-23 | Disposition: A | Discharge status: Home / Self Care | Payer: Medicare Other | Source: Ambulatory Visit | Attending: Internal Medicine | Admitting: Internal Medicine

## 2014-10-23 DIAGNOSIS — K7469 Other cirrhosis of liver: Secondary | ICD-10-CM | POA: Diagnosis not present

## 2014-10-23 DIAGNOSIS — R188 Other ascites: Secondary | ICD-10-CM | POA: Diagnosis not present

## 2014-10-23 LAB — BODY FLUID CELL COUNT WITH DIFFERENTIAL
Eos, Fluid: 0 %
LYMPHS FL: 51 %
MONOCYTE-MACROPHAGE-SEROUS FLUID: 39 % — AB (ref 50–90)
NEUTROPHIL FLUID: 8 % (ref 0–25)
Other Cells, Fluid: 2 %
Total Nucleated Cell Count, Fluid: 275 cu mm (ref 0–1000)

## 2014-10-23 NOTE — Progress Notes (Signed)
Paracentesis complete no signs of distress. 9500 ml white colored ascites removed.

## 2014-10-24 LAB — PATHOLOGIST SMEAR REVIEW

## 2014-10-25 ENCOUNTER — Ambulatory Visit (HOSPITAL_COMMUNITY): Payer: Medicare Other

## 2014-10-28 LAB — BODY FLUID CULTURE: Culture: NO GROWTH

## 2014-10-28 LAB — ANAEROBIC CULTURE

## 2014-11-07 ENCOUNTER — Emergency Department (HOSPITAL_COMMUNITY)
Admission: EM | Admit: 2014-11-07 | Discharge: 2014-11-07 | Disposition: A | Discharge status: Home / Self Care | Payer: Medicare Other | Attending: Emergency Medicine | Admitting: Emergency Medicine

## 2014-11-07 ENCOUNTER — Encounter (HOSPITAL_COMMUNITY): Payer: Self-pay | Admitting: Emergency Medicine

## 2014-11-07 ENCOUNTER — Emergency Department (HOSPITAL_COMMUNITY): Payer: Medicare Other

## 2014-11-07 DIAGNOSIS — I2510 Atherosclerotic heart disease of native coronary artery without angina pectoris: Secondary | ICD-10-CM | POA: Diagnosis not present

## 2014-11-07 DIAGNOSIS — Z87891 Personal history of nicotine dependence: Secondary | ICD-10-CM | POA: Insufficient documentation

## 2014-11-07 DIAGNOSIS — N186 End stage renal disease: Secondary | ICD-10-CM | POA: Insufficient documentation

## 2014-11-07 DIAGNOSIS — Z87448 Personal history of other diseases of urinary system: Secondary | ICD-10-CM | POA: Insufficient documentation

## 2014-11-07 DIAGNOSIS — Z8639 Personal history of other endocrine, nutritional and metabolic disease: Secondary | ICD-10-CM | POA: Insufficient documentation

## 2014-11-07 DIAGNOSIS — R109 Unspecified abdominal pain: Secondary | ICD-10-CM | POA: Diagnosis present

## 2014-11-07 DIAGNOSIS — I120 Hypertensive chronic kidney disease with stage 5 chronic kidney disease or end stage renal disease: Secondary | ICD-10-CM | POA: Insufficient documentation

## 2014-11-07 DIAGNOSIS — Z9089 Acquired absence of other organs: Secondary | ICD-10-CM | POA: Insufficient documentation

## 2014-11-07 DIAGNOSIS — K4090 Unilateral inguinal hernia, without obstruction or gangrene, not specified as recurrent: Secondary | ICD-10-CM | POA: Insufficient documentation

## 2014-11-07 DIAGNOSIS — Z992 Dependence on renal dialysis: Secondary | ICD-10-CM | POA: Insufficient documentation

## 2014-11-07 DIAGNOSIS — Z85528 Personal history of other malignant neoplasm of kidney: Secondary | ICD-10-CM | POA: Diagnosis not present

## 2014-11-07 DIAGNOSIS — N189 Chronic kidney disease, unspecified: Secondary | ICD-10-CM

## 2014-11-07 DIAGNOSIS — R188 Other ascites: Secondary | ICD-10-CM | POA: Diagnosis not present

## 2014-11-07 LAB — COMPREHENSIVE METABOLIC PANEL
ALBUMIN: 3.2 g/dL — AB (ref 3.5–5.2)
ALK PHOS: 247 U/L — AB (ref 39–117)
ALT: 26 U/L (ref 0–53)
ANION GAP: 15 (ref 5–15)
AST: 37 U/L (ref 0–37)
BILIRUBIN TOTAL: 0.8 mg/dL (ref 0.3–1.2)
BUN: 51 mg/dL — ABNORMAL HIGH (ref 6–23)
CHLORIDE: 90 meq/L — AB (ref 96–112)
CO2: 24 mmol/L (ref 19–32)
Calcium: 8.6 mg/dL (ref 8.4–10.5)
Creatinine, Ser: 8.91 mg/dL — ABNORMAL HIGH (ref 0.50–1.35)
GFR calc Af Amer: 6 mL/min — ABNORMAL LOW (ref >90)
GFR calc non Af Amer: 5 mL/min — ABNORMAL LOW (ref >90)
Glucose, Bld: 99 mg/dL (ref 70–99)
POTASSIUM: 4.3 mmol/L (ref 3.5–5.1)
Sodium: 129 mmol/L — ABNORMAL LOW (ref 135–145)
Total Protein: 7.7 g/dL (ref 6.0–8.3)

## 2014-11-07 LAB — CBC
HEMATOCRIT: 32.5 % — AB (ref 39.0–52.0)
HEMOGLOBIN: 10.9 g/dL — AB (ref 13.0–17.0)
MCH: 31.5 pg (ref 26.0–34.0)
MCHC: 33.5 g/dL (ref 30.0–36.0)
MCV: 93.9 fL (ref 78.0–100.0)
Platelets: 265 10*3/uL (ref 150–400)
RBC: 3.46 MIL/uL — AB (ref 4.22–5.81)
RDW: 16.2 % — ABNORMAL HIGH (ref 11.5–15.5)
WBC: 5.1 10*3/uL (ref 4.0–10.5)

## 2014-11-07 NOTE — ED Provider Notes (Signed)
Pt having therapeutic paracentesis Pt is resting comfortably He denies complaints He reports his BP is always low, usually in 66Z Systolic Review of records reveals last SBP on 12/29 was [place], MD 11/07/14 0840

## 2014-11-07 NOTE — ED Provider Notes (Signed)
Pt feels improved after paracentesis His abd is soft no focal tenderness He would like to be discharged He has no received albumin after previous paracentesis (previous one drained 9L) I spoke to his nephrologist dr Lowanda Foster - he advises to have patient dialyzed tomorrow Labs reviewed Pt is comfortable with this plan He will ambulate then be discharged home   Sharyon Cable, MD 11/07/14 [id number]

## 2014-11-07 NOTE — ED Notes (Signed)
Pt gone to US

## 2014-11-07 NOTE — Progress Notes (Signed)
Paracentesis complete no signs of distress. 6700 ml serosanguinous ascites removed.

## 2014-11-07 NOTE — ED Notes (Signed)
Per Dr. Florina Ou, pt will have therapeutic paracentis in AM, and will be discharge to attend dialysis at [facility], Linna Hoff, we will contact dialysis center, and inform them pt will be late for dialysis this am.

## 2014-11-07 NOTE — ED Provider Notes (Addendum)
See down time documentation.  Nursing notes and vitals signs, including pulse oximetry, reviewed.  Summary of this visit's results, reviewed by myself:  Labs:  Results for orders placed or performed during the hospital encounter of 11/07/14 (from the past 24 hour(s))  Comprehensive metabolic panel     Status: Abnormal   Collection Time: 11/07/14  2:15 AM  Result Value Ref Range   Sodium 129 (L) 135 - 145 mmol/L   Potassium 4.3 3.5 - 5.1 mmol/L   Chloride 90 (L) 96 - 112 mEq/L   CO2 24 19 - 32 mmol/L   Glucose, Bld 99 70 - 99 mg/dL   BUN 51 (H) 6 - 23 mg/dL   Creatinine, Ser 8.91 (H) 0.50 - 1.35 mg/dL   Calcium 8.6 8.4 - 10.5 mg/dL   Total Protein 7.7 6.0 - 8.3 g/dL   Albumin 3.2 (L) 3.5 - 5.2 g/dL   AST 37 0 - 37 U/L   ALT 26 0 - 53 U/L   Alkaline Phosphatase 247 (H) 39 - 117 U/L   Total Bilirubin 0.8 0.3 - 1.2 mg/dL   GFR calc non Af Amer 5 (L) >90 mL/min   GFR calc Af Amer 6 (L) >90 mL/min   Anion gap 15 5 - 15  CBC     Status: Abnormal   Collection Time: 11/07/14  2:15 AM  Result Value Ref Range   WBC 5.1 4.0 - 10.5 K/uL   RBC 3.46 (L) 4.22 - 5.81 MIL/uL   Hemoglobin 10.9 (L) 13.0 - 17.0 g/dL   HCT 32.5 (L) 39.0 - 52.0 %   MCV 93.9 78.0 - 100.0 fL   MCH 31.5 26.0 - 34.0 pg   MCHC 33.5 30.0 - 36.0 g/dL   RDW 16.2 (H) 11.5 - 15.5 %   Platelets 265 150 - 400 K/uL   5:35 AM Arrangements have been made with the patient's dialysis center for him to be dialyzed after his therapeutic paracentesis this morning.   Wynetta Fines, MD 11/07/14 [place], MD 11/07/14 [phone]

## 2014-11-07 NOTE — ED Notes (Signed)
Sandbags to right groin to keep inguinal hernia in place, pt tolerating well.

## 2014-11-07 NOTE — ED Notes (Signed)
Pt ambulated well with only the assistance of his walker, which he uses at home.

## 2014-11-07 NOTE — ED Notes (Signed)
Pt has c/o of hernia to right groin since 6pm last night, hasn't done any heavy lifting, no c.o N/VD.

## 2014-11-07 NOTE — Discharge Instructions (Signed)
Ascites °Ascites is a gathering of fluid in the belly (abdomen). This is most often caused by liver disease. It may also be caused by a number of other less common problems. It causes a ballooning out (distension) of the abdomen. °CAUSES  °Scarring of the liver (cirrhosis) is the most common cause of ascites. Other causes include: °· Infection or inflammation in the abdomen. °· Cancer in the abdomen. °· Heart failure. °· Certain forms of kidney failure (nephritic syndrome). °· Inflammation of the pancreas. °· Clots in the veins of the liver. °SYMPTOMS  °In the early stages of ascites, you may not have any symptoms. The main symptom of ascites is a sense of abdominal bloating. This is due to the presence of fluid. This may also cause an increase in abdominal or waist size. People with this condition can develop swelling in the legs, and men can develop a swollen scrotum. When there is a lot of fluid, it may be hard to breath. Stretching of the abdomen by fluid can be painful. °DIAGNOSIS  °Certain features of your medical history, such as a history of liver disease and of an enlarging abdomen, can suggest the presence of ascites. The diagnosis of ascites can be made on physical exam by your caregiver. An abdominal ultrasound examination can confirm that ascites is present, and estimate the amount of fluid. °Once ascites is confirmed, it is important to determine its cause. Again, a history of one of the conditions listed in "CAUSES" provides a strong clue. A physical exam is important, and blood and X-ray tests may be needed. During a procedure called paracentesis, a sample of fluid is removed from the abdomen. This can determine certain key features about the fluid, such as whether or not infection or cancer is present. Your caregiver will determine if a paracentesis is necessary. They will describe the procedure to you. °PREVENTION  °Ascites is a complication of other conditions. Therefore to prevent ascites, you  must seek treatment for any significant health conditions you have. Once ascites is present, careful attention to fluid and salt intake may help prevent it from getting worse. If you have ascites, you should not drink alcohol. °PROGNOSIS  °The prognosis of ascites depends on the underlying disease. If the disease is reversible, such as with certain infections or with heart failure, then ascites may improve or disappear. When ascites is caused by cirrhosis, then it indicates that the liver disease has worsened, and further evaluation and treatment of the liver disease is needed. If your ascites is caused by cancer, then the success or failure of the cancer treatment will determine whether your ascites will improve or worsen. °RISKS AND COMPLICATIONS  °Ascites is likely to worsen if it is not properly diagnosed and treated. A large amount of ascites can cause pain and difficulty breathing. The main complication, besides worsening, is infection (called spontaneous bacterial peritonitis). This requires prompt treatment. °TREATMENT  °The treatment of ascites depends on its cause. When liver disease is your cause, medical management using water pills (diuretics) and decreasing salt intake is often effective. Ascites due to peritoneal inflammation or malignancy (cancer) alone does not respond to salt restriction and diuretics. Hospitalization is sometimes required. °If the treatment of ascites cannot be managed with medications, a number of other treatments are available. Your caregivers will help you decide which will work best for you. Some of these are: °· Removal of fluid from the abdomen (paracentesis). °· Fluid from the abdomen is passed into a vein (peritoneovenous shunting). °·   Liver transplantation. °· Transjugular intrahepatic portosystemic stent shunt. °HOME CARE INSTRUCTIONS  °It is important to monitor body weight and the intake and output of fluids. Weigh yourself at the same time every day. Record your  weights. Fluid restriction may be necessary. It is also important to know your salt intake. The more salt you take in, the more fluid you will retain. Ninety percent of people with ascites respond to this approach. °· Follow any directions for medicines carefully. °· Follow up with your caregiver, as directed. °· Report any changes in your health, especially any new or worsening symptoms. °· If your ascites is from liver disease, avoid alcohol and other substances toxic to the liver. °SEEK MEDICAL CARE IF:  °· Your weight increases more than a few pounds in a few days. °· Your abdominal or waist size increases. °· You develop swelling in your legs. °· You had swelling and it worsens. °SEEK IMMEDIATE MEDICAL CARE IF:  °· You develop a fever. °· You develop new abdominal pain. °· You develop difficulty breathing. °· You develop confusion. °· You have bleeding from the mouth, stomach, or rectum. °MAKE SURE YOU:  °· Understand these instructions. °· Will watch your condition. °· Will get help right away if you are not doing well or get worse. °Document Released: 10/12/2005 Document Revised: 01/04/2012 Document Reviewed: 05/13/2007 °ExitCare® Patient Information ©2015 [employer]. This information is not intended to replace advice given to you by your health care provider. Make sure you discuss any questions you have with your health care provider. ° °

## 2014-11-20 ENCOUNTER — Other Ambulatory Visit: Payer: Self-pay | Admitting: Radiology

## 2014-11-20 ENCOUNTER — Other Ambulatory Visit (HOSPITAL_COMMUNITY): Payer: Self-pay | Admitting: Internal Medicine

## 2014-11-20 DIAGNOSIS — E854 Organ-limited amyloidosis: Secondary | ICD-10-CM

## 2014-11-22 ENCOUNTER — Ambulatory Visit (HOSPITAL_COMMUNITY)
Admission: RE | Admit: 2014-11-22 | Discharge: 2014-11-22 | Disposition: A | Discharge status: Home / Self Care | Payer: Medicare Other | Source: Ambulatory Visit | Attending: Internal Medicine | Admitting: Internal Medicine

## 2014-11-22 ENCOUNTER — Encounter (HOSPITAL_COMMUNITY): Payer: Self-pay

## 2014-11-22 ENCOUNTER — Other Ambulatory Visit (HOSPITAL_COMMUNITY): Payer: Self-pay | Admitting: Internal Medicine

## 2014-11-22 DIAGNOSIS — R188 Other ascites: Secondary | ICD-10-CM | POA: Insufficient documentation

## 2014-11-22 DIAGNOSIS — E854 Organ-limited amyloidosis: Secondary | ICD-10-CM

## 2014-11-22 DIAGNOSIS — N186 End stage renal disease: Secondary | ICD-10-CM | POA: Diagnosis not present

## 2014-11-22 DIAGNOSIS — I43 Cardiomyopathy in diseases classified elsewhere: Secondary | ICD-10-CM | POA: Diagnosis not present

## 2014-11-22 MED ORDER — HYDROCODONE-ACETAMINOPHEN 5-325 MG PO TABS
1.0000 | ORAL_TABLET | Freq: Once | ORAL | Status: AC
  Administered 2014-11-22: 1 via ORAL
  Filled 2014-11-22: qty 1

## 2014-11-22 MED ORDER — SODIUM CHLORIDE 0.9 % IV SOLN: INTRAVENOUS | Status: DC

## 2014-11-22 MED ORDER — VANCOMYCIN HCL IN DEXTROSE 1-5 GM/200ML-% IV SOLN: 1000.0000 mg | INTRAVENOUS | Status: DC

## 2014-11-22 NOTE — Progress Notes (Signed)
[facility] ED CM called to assist with home health for pt if he agrees.  Pt lives in [place] has peritoneal drain that was placed in [place].  Pt per short stay staff [phone]) voices frustration wth the care of the drain. Cm to speak with pt about home health resources

## 2014-11-22 NOTE — Progress Notes (Signed)
Patient arrived to have drainage tube removed since can not find assistance to help drain it. Drainage removed via Rad tech per K Allred PA. Case Manager called to help w assistance

## 2014-11-22 NOTE — Progress Notes (Signed)
[place] with Margarita Grizzle at Dr Gerarda Fraction (pcp) office [phone] to updated him on initiation on home health services 1410 Spoke with pt and family about home health [facility]) (length of stay in home, types of [facility] staff available, coverage, primary caregiver, up to 24 hrs before services may be started) and Private duty nursing (PDN-coverage, length of stay in the home types of staff available) CM provided family with a list of [place] home health agencies and PDN. Provided [facility] contact as [phone]   1405 ED Cm spoke with Jeannene Patella, Office Administrator for Dr Andree Elk office, to get order for home health RN for drainage care, wound care and s/s to report to MD for any complications with drainage, wound. Provided CM mobile contact and fax for short stay (fax [phone])  for orders. Pending orders 1358 attempts to reach Dr Andree Elk at [phone] (leads to a voice message system) & [phone] (Kim assisted CM to reach [facility]) 1349 Spoke with Cyril Mourning of [facility] to discuss pt concerns.  Pt is eligible for [facility] services for [facility] pending order from Dr Winona Legato.   45 [facility] ED CM spoke with pt, wife and daughter at bedside about request for home health services for an aspira drainage system. Natasha Bence --[id number])) Wife stated after drain placed attempts were made to get home health services without success from [facility] system. Choice is [facility]

## 2014-11-22 NOTE — Procedures (Signed)
Following informed consent and appropriate sterile technique the pt's RLQ peritoneal aspira drain was accessed and approximately 4.5 liters turbid, pink fluid were removed without immediate complications. The pt will be evaluated by care management for [facility] services to assist with future drainages of ascites via aspira  prn.

## 2014-11-22 NOTE — Progress Notes (Signed)
ED CM has not received fax from Dr Andree Elk as of this time. CM called Dr Andree Elk office at [phone] and spoke with Butch Penny to get her to request home health order for [facility] with specific orders for care of pt's aspira drain from Dr Andree Elk or his RN Left Cm mobile number, CM fax number and fax number for [facility] as [phone] if sent after hours Pending orders from Dr Andree Elk faxed to either [facility] short stay, CM office or [facility]

## 2014-11-22 NOTE — Progress Notes (Addendum)
Was unable to re-evaluate patient, because he and his family left w radiologist tech, K Mazepa, to front lobby

## 2014-11-23 NOTE — Progress Notes (Signed)
[name] spoke with wife to see how pt was doing.  Reports pt is doing well Reports [facility] staff called the home today but wife was not home. Only pt was home, therefore Pt requested [facility] staff call back to speak with wife . Wife appreciative of services rendered and will call CM if issues occur. Late entry for 11/23/14 1035 Received fax confirmation at [phone]

## 2014-11-23 NOTE — Progress Notes (Signed)
Received a call (CM mobile)from NP, Marcie Bal at Dr Andree Elk order Reviewed again with her pt's Short stay visit 11/22/14.  She inquired about the amount of fluid removed on 11/22/14 Informed her notes stated 4.5 Liter removed She states this would be a total for about a week's time.  She is faxing Cm home health orders with detail HHRN interventions.  CM provided her office fax number. Marcie Bal provided her return number as [phone] fax [phone]. Pending receipt of fax

## 2014-11-28 NOTE — Progress Notes (Signed)
Patient is no longer at [facility] and no answer at the home number listed.

## 2014-11-29 ENCOUNTER — Ambulatory Visit (INDEPENDENT_AMBULATORY_CARE_PROVIDER_SITE_OTHER): Payer: Medicare Other | Admitting: Otolaryngology

## 2014-11-29 DIAGNOSIS — R1312 Dysphagia, oropharyngeal phase: Secondary | ICD-10-CM

## 2014-11-29 DIAGNOSIS — H903 Sensorineural hearing loss, bilateral: Secondary | ICD-10-CM

## 2014-12-03 ENCOUNTER — Emergency Department (HOSPITAL_COMMUNITY): Payer: Medicare Other

## 2014-12-03 ENCOUNTER — Encounter (HOSPITAL_COMMUNITY): Payer: Self-pay | Admitting: Emergency Medicine

## 2014-12-03 ENCOUNTER — Inpatient Hospital Stay (HOSPITAL_COMMUNITY)
Admission: EM | Admit: 2014-12-03 | Discharge: 2014-12-06 | DRG: 871 | Disposition: A | Discharge status: Home / Self Care | Payer: Medicare Other | Attending: Internal Medicine | Admitting: Internal Medicine

## 2014-12-03 DIAGNOSIS — I43 Cardiomyopathy in diseases classified elsewhere: Secondary | ICD-10-CM | POA: Diagnosis present

## 2014-12-03 DIAGNOSIS — M109 Gout, unspecified: Secondary | ICD-10-CM | POA: Diagnosis present

## 2014-12-03 DIAGNOSIS — Z992 Dependence on renal dialysis: Secondary | ICD-10-CM

## 2014-12-03 DIAGNOSIS — E854 Organ-limited amyloidosis: Secondary | ICD-10-CM | POA: Diagnosis present

## 2014-12-03 DIAGNOSIS — N400 Enlarged prostate without lower urinary tract symptoms: Secondary | ICD-10-CM | POA: Diagnosis present

## 2014-12-03 DIAGNOSIS — R0602 Shortness of breath: Secondary | ICD-10-CM

## 2014-12-03 DIAGNOSIS — I482 Chronic atrial fibrillation: Secondary | ICD-10-CM | POA: Diagnosis present

## 2014-12-03 DIAGNOSIS — Z809 Family history of malignant neoplasm, unspecified: Secondary | ICD-10-CM

## 2014-12-03 DIAGNOSIS — E875 Hyperkalemia: Secondary | ICD-10-CM | POA: Diagnosis present

## 2014-12-03 DIAGNOSIS — Z96642 Presence of left artificial hip joint: Secondary | ICD-10-CM | POA: Diagnosis present

## 2014-12-03 DIAGNOSIS — Z8249 Family history of ischemic heart disease and other diseases of the circulatory system: Secondary | ICD-10-CM

## 2014-12-03 DIAGNOSIS — I2510 Atherosclerotic heart disease of native coronary artery without angina pectoris: Secondary | ICD-10-CM | POA: Diagnosis present

## 2014-12-03 DIAGNOSIS — K219 Gastro-esophageal reflux disease without esophagitis: Secondary | ICD-10-CM | POA: Diagnosis present

## 2014-12-03 DIAGNOSIS — Z87891 Personal history of nicotine dependence: Secondary | ICD-10-CM | POA: Diagnosis not present

## 2014-12-03 DIAGNOSIS — N186 End stage renal disease: Secondary | ICD-10-CM | POA: Diagnosis present

## 2014-12-03 DIAGNOSIS — M1990 Unspecified osteoarthritis, unspecified site: Secondary | ICD-10-CM | POA: Diagnosis present

## 2014-12-03 DIAGNOSIS — N19 Unspecified kidney failure: Secondary | ICD-10-CM

## 2014-12-03 DIAGNOSIS — R188 Other ascites: Secondary | ICD-10-CM | POA: Diagnosis present

## 2014-12-03 DIAGNOSIS — I959 Hypotension, unspecified: Secondary | ICD-10-CM | POA: Diagnosis present

## 2014-12-03 DIAGNOSIS — R42 Dizziness and giddiness: Secondary | ICD-10-CM | POA: Diagnosis present

## 2014-12-03 DIAGNOSIS — I5032 Chronic diastolic (congestive) heart failure: Secondary | ICD-10-CM | POA: Diagnosis present

## 2014-12-03 DIAGNOSIS — A419 Sepsis, unspecified organism: Principal | ICD-10-CM | POA: Diagnosis present

## 2014-12-03 DIAGNOSIS — K7460 Unspecified cirrhosis of liver: Secondary | ICD-10-CM | POA: Diagnosis present

## 2014-12-03 DIAGNOSIS — D638 Anemia in other chronic diseases classified elsewhere: Secondary | ICD-10-CM | POA: Diagnosis present

## 2014-12-03 DIAGNOSIS — E871 Hypo-osmolality and hyponatremia: Secondary | ICD-10-CM | POA: Diagnosis present

## 2014-12-03 DIAGNOSIS — I120 Hypertensive chronic kidney disease with stage 5 chronic kidney disease or end stage renal disease: Secondary | ICD-10-CM | POA: Diagnosis present

## 2014-12-03 DIAGNOSIS — I9589 Other hypotension: Secondary | ICD-10-CM

## 2014-12-03 LAB — CBC WITH DIFFERENTIAL/PLATELET
BASOS PCT: 0 % (ref 0–1)
Basophils Absolute: 0 10*3/uL (ref 0.0–0.1)
EOS PCT: 0 % (ref 0–5)
Eosinophils Absolute: 0 10*3/uL (ref 0.0–0.7)
HCT: 30 % — ABNORMAL LOW (ref 39.0–52.0)
HEMOGLOBIN: 10.1 g/dL — AB (ref 13.0–17.0)
Lymphocytes Relative: 5 % — ABNORMAL LOW (ref 12–46)
Lymphs Abs: 0.7 10*3/uL (ref 0.7–4.0)
MCH: 30.8 pg (ref 26.0–34.0)
MCHC: 33.7 g/dL (ref 30.0–36.0)
MCV: 91.5 fL (ref 78.0–100.0)
MONO ABS: 0.5 10*3/uL (ref 0.1–1.0)
MONOS PCT: 4 % (ref 3–12)
NEUTROS PCT: 91 % — AB (ref 43–77)
Neutro Abs: 10.7 10*3/uL — ABNORMAL HIGH (ref 1.7–7.7)
Platelets: 301 10*3/uL (ref 150–400)
RBC: 3.28 MIL/uL — ABNORMAL LOW (ref 4.22–5.81)
RDW: 14.3 % (ref 11.5–15.5)
WBC: 11.9 10*3/uL — ABNORMAL HIGH (ref 4.0–10.5)

## 2014-12-03 LAB — COMPREHENSIVE METABOLIC PANEL
ALT: 15 U/L (ref 0–53)
AST: 22 U/L (ref 0–37)
Albumin: 1.9 g/dL — ABNORMAL LOW (ref 3.5–5.2)
Alkaline Phosphatase: 189 U/L — ABNORMAL HIGH (ref 39–117)
Anion gap: 16 — ABNORMAL HIGH (ref 5–15)
BILIRUBIN TOTAL: 1.6 mg/dL — AB (ref 0.3–1.2)
BUN: 62 mg/dL — AB (ref 6–23)
CO2: 22 mmol/L (ref 19–32)
Calcium: 7.7 mg/dL — ABNORMAL LOW (ref 8.4–10.5)
Chloride: 94 mmol/L — ABNORMAL LOW (ref 96–112)
Creatinine, Ser: 8.29 mg/dL — ABNORMAL HIGH (ref 0.50–1.35)
GFR calc Af Amer: 7 mL/min — ABNORMAL LOW (ref >90)
GFR calc non Af Amer: 6 mL/min — ABNORMAL LOW (ref >90)
GLUCOSE: 61 mg/dL — AB (ref 70–99)
Potassium: 5.3 mmol/L — ABNORMAL HIGH (ref 3.5–5.1)
Sodium: 132 mmol/L — ABNORMAL LOW (ref 135–145)
Total Protein: 6.7 g/dL (ref 6.0–8.3)

## 2014-12-03 LAB — I-STAT CG4 LACTIC ACID, ED: Lactic Acid, Venous: 4.78 mmol/L (ref 0.5–2.0)

## 2014-12-03 LAB — TROPONIN I
TROPONIN I: 0.09 ng/mL — AB (ref <0.031)
TROPONIN I: 0.08 ng/mL — AB (ref <0.031)
Troponin I: 0.08 ng/mL — ABNORMAL HIGH (ref <0.031)

## 2014-12-03 LAB — MRSA PCR SCREENING: MRSA BY PCR: POSITIVE — AB

## 2014-12-03 LAB — LIPASE, BLOOD: Lipase: 22 U/L (ref 11–59)

## 2014-12-03 MED ORDER — SODIUM CHLORIDE 0.9 % IV SOLN
INTRAVENOUS | Status: AC
  Administered 2014-12-03: 12:00:00 via INTRAVENOUS

## 2014-12-03 MED ORDER — ONDANSETRON HCL 4 MG PO TABS: 4.0000 mg | ORAL_TABLET | Freq: Four times a day (QID) | ORAL | Status: DC | PRN

## 2014-12-03 MED ORDER — VANCOMYCIN HCL IN DEXTROSE 750-5 MG/150ML-% IV SOLN
750.0000 mg | INTRAVENOUS | Status: DC
  Administered 2014-12-05: 750 mg via INTRAVENOUS
  Filled 2014-12-03 (×2): qty 150

## 2014-12-03 MED ORDER — NEPRO/CARBSTEADY PO LIQD: 237.0000 mL | ORAL | Status: DC | PRN

## 2014-12-03 MED ORDER — LIDOCAINE HCL (PF) 1 % IJ SOLN: 5.0000 mL | INTRAMUSCULAR | Status: DC | PRN

## 2014-12-03 MED ORDER — ONDANSETRON HCL 4 MG/2ML IJ SOLN: 4.0000 mg | Freq: Four times a day (QID) | INTRAMUSCULAR | Status: DC | PRN

## 2014-12-03 MED ORDER — SODIUM CHLORIDE 0.9 % IV BOLUS (SEPSIS)
250.0000 mL | Freq: Once | INTRAVENOUS | Status: AC
  Administered 2014-12-03: 250 mL via INTRAVENOUS

## 2014-12-03 MED ORDER — PIPERACILLIN-TAZOBACTAM 3.375 G IVPB 30 MIN
3.3750 g | Freq: Once | INTRAVENOUS | Status: AC
  Administered 2014-12-03: 3.375 g via INTRAVENOUS
  Filled 2014-12-03: qty 50

## 2014-12-03 MED ORDER — VANCOMYCIN HCL IN DEXTROSE 1-5 GM/200ML-% IV SOLN
1000.0000 mg | Freq: Once | INTRAVENOUS | Status: AC
  Administered 2014-12-03: 1000 mg via INTRAVENOUS
  Filled 2014-12-03: qty 200

## 2014-12-03 MED ORDER — ASPIRIN 81 MG PO CHEW
81.0000 mg | CHEWABLE_TABLET | Freq: Every day | ORAL | Status: DC
  Administered 2014-12-04 – 2014-12-06 (×3): 81 mg via ORAL
  Filled 2014-12-03 (×3): qty 1

## 2014-12-03 MED ORDER — LIDOCAINE-PRILOCAINE 2.5-2.5 % EX CREA
1.0000 "application " | TOPICAL_CREAM | CUTANEOUS | Status: DC | PRN
  Filled 2014-12-03: qty 5

## 2014-12-03 MED ORDER — SODIUM CHLORIDE 0.9 % IV SOLN: 100.0000 mL | INTRAVENOUS | Status: DC | PRN

## 2014-12-03 MED ORDER — HYDROCODONE-ACETAMINOPHEN 5-325 MG PO TABS: 1.0000 | ORAL_TABLET | Freq: Two times a day (BID) | ORAL | Status: DC | PRN

## 2014-12-03 MED ORDER — SODIUM CHLORIDE 0.9 % IV SOLN
INTRAVENOUS | Status: DC
  Administered 2014-12-04 (×2): via INTRAVENOUS

## 2014-12-03 MED ORDER — HEPARIN SODIUM (PORCINE) 1000 UNIT/ML DIALYSIS
1000.0000 [IU] | INTRAMUSCULAR | Status: DC | PRN
  Filled 2014-12-03: qty 1

## 2014-12-03 MED ORDER — CINACALCET HCL 30 MG PO TABS
30.0000 mg | ORAL_TABLET | Freq: Every day | ORAL | Status: DC
  Administered 2014-12-04 – 2014-12-06 (×3): 30 mg via ORAL
  Filled 2014-12-03 (×4): qty 1

## 2014-12-03 MED ORDER — MIDODRINE HCL 5 MG PO TABS
10.0000 mg | ORAL_TABLET | Freq: Three times a day (TID) | ORAL | Status: DC
  Administered 2014-12-04 – 2014-12-06 (×8): 10 mg via ORAL
  Filled 2014-12-03 (×8): qty 2

## 2014-12-03 MED ORDER — PIPERACILLIN-TAZOBACTAM IN DEX 2-0.25 GM/50ML IV SOLN
INTRAVENOUS | Status: AC
  Filled 2014-12-03: qty 50

## 2014-12-03 MED ORDER — PIPERACILLIN-TAZOBACTAM IN DEX 2-0.25 GM/50ML IV SOLN
2.2500 g | Freq: Three times a day (TID) | INTRAVENOUS | Status: DC
  Administered 2014-12-03 – 2014-12-06 (×9): 2.25 g via INTRAVENOUS
  Filled 2014-12-03 (×12): qty 50

## 2014-12-03 MED ORDER — SEVELAMER CARBONATE 800 MG PO TABS
1600.0000 mg | ORAL_TABLET | Freq: Three times a day (TID) | ORAL | Status: DC
  Administered 2014-12-04 – 2014-12-06 (×8): 1600 mg via ORAL
  Filled 2014-12-03 (×8): qty 2

## 2014-12-03 MED ORDER — PENTAFLUOROPROP-TETRAFLUOROETH EX AERO
1.0000 "application " | INHALATION_SPRAY | CUTANEOUS | Status: DC | PRN
  Filled 2014-12-03: qty 30

## 2014-12-03 MED ORDER — SODIUM CHLORIDE 0.9 % IV SOLN
1.0000 g | Freq: Once | INTRAVENOUS | Status: AC
  Administered 2014-12-03: 1 g via INTRAVENOUS
  Filled 2014-12-03: qty 10

## 2014-12-03 MED ORDER — ENOXAPARIN SODIUM 30 MG/0.3ML ~~LOC~~ SOLN: 30.0000 mg | SUBCUTANEOUS | Status: DC

## 2014-12-03 MED ORDER — VENLAFAXINE HCL ER 75 MG PO CP24
75.0000 mg | ORAL_CAPSULE | Freq: Every morning | ORAL | Status: DC
  Administered 2014-12-04 – 2014-12-06 (×3): 75 mg via ORAL
  Filled 2014-12-03 (×3): qty 1

## 2014-12-03 MED ORDER — ALTEPLASE 2 MG IJ SOLR
2.0000 mg | Freq: Once | INTRAMUSCULAR | Status: AC | PRN
  Filled 2014-12-03: qty 2

## 2014-12-03 NOTE — ED Notes (Signed)
Pulse range 88-92% on 6L. Not receiving a good pleth signal, hands being warmed, two 02 devices monitoring O2 sats. EDP aware, RT called.

## 2014-12-03 NOTE — Progress Notes (Signed)
ANTIBIOTIC CONSULT NOTE  Pharmacy Consult for Vancomycin & Zosyn Indication: rule out sepsis  No Known Allergies  Patient Measurements: Height: 5\' 8"  (172.7 cm) Weight: 135 lb (61.236 kg) IBW/kg (Calculated) : 68.4  Vital Signs: Temp: 97.8 F (36.6 C) (02/08 0809) Temp Source: Oral (02/08 0809) BP: 75/62 mmHg (02/08 1349) Pulse Rate: 85 (02/08 1349) Intake/Output from previous day:   Intake/Output from this shift:    Labs:  Recent Labs  12/03/14 0849  WBC 11.9*  HGB 10.1*  PLT 301  CREATININE 8.29*   Estimated Creatinine Clearance: 7.4 mL/min (by C-G formula based on Cr of 8.29). No results for input(s): VANCOTROUGH, VANCOPEAK, VANCORANDOM, GENTTROUGH, GENTPEAK, GENTRANDOM, TOBRATROUGH, TOBRAPEAK, TOBRARND, AMIKACINPEAK, AMIKACINTROU, AMIKACIN in the last 72 hours.   Microbiology: Recent Results (from the past 720 hour(s))  Culture, blood (routine x 2)     Status: None (Preliminary result)   Collection Time: 12/03/14  9:03 AM  Result Value Ref Range Status   Specimen Description Blood  Final   Special Requests NONE  Final   Culture NO GROWTH <24 HRS  Final   Report Status PENDING  Incomplete  Culture, blood (routine x 2)     Status: None (Preliminary result)   Collection Time: 12/03/14  9:03 AM  Result Value Ref Range Status   Specimen Description Blood  Final   Special Requests NONE  Final   Culture NO GROWTH <24 HRS  Final   Report Status PENDING  Incomplete    Anti-infectives    Start     Dose/Rate Route Frequency Ordered Stop   12/03/14 0915  piperacillin-tazobactam (ZOSYN) IVPB 3.375 g     3.375 g100 mL/hr over 30 Minutes Intravenous  Once 12/03/14 0900 12/03/14 1325   12/03/14 0915  vancomycin (VANCOCIN) IVPB 1000 mg/200 mL premix     1,000 mg200 mL/hr over 60 Minutes Intravenous  Once 12/03/14 0900 12/03/14 1115      Assessment: 69 yo M who presents with abdominal pain, HA, and fever.   WBC mildly elevated.  Lactic acid elevated.  Concern for  peritonitis as source.  Empiric, broad-sepctrum antibiotics initiated with Vanc & Zosyn in ED.   He has ESRD and requires HD MWF.  Vancomycin 2/8>> Zosyn 2/8>>  Goal of Therapy:  Pre-HD level 15-25mcg/ml  Plan:  Zosyn 2.25gm IV q8h Vancomycin 750mg  IV qHD (1gm loading dose given in ED) Check pre-HD level at steady state F/U labs, cx data, patient progress  [name], [name] 12/03/2014,2:52 PM

## 2014-12-03 NOTE — ED Notes (Signed)
Pt. Assessed by RT, O2 sats reading 92-99 on 4L when signal comes in, pt. Has poor perfusion, O2 being monitored with a finger probe and ear probe. Pt. Does not appear to be in respiratory distress. Lungs clear, chest xray is WNL. Recommended to keep pt. On O2, pt. Does not have a respiratory history to contraindicate oxygen use.

## 2014-12-03 NOTE — ED Notes (Signed)
Pt c/o feeling bad since Saturday. C/o sob, abd pain and dizziness/blurrd vision. Pt also has slurred speech, wife states pt had teeth pulled Thursday and she thinks it started then.

## 2014-12-03 NOTE — H&P (Signed)
[facility] History and Physical  [name]:[id number] DOB: Jun 18, 1946 DOA: 12/03/2014  Referring physician: EDP PCP: Glo Herring., MD   Chief Complaint: nausea, abdominal pain.   HPI: [name] is a 70 y.o. male with prior h/o amyloid heart disease, cardiomyopathy, chronic atrial fibrillation not on anticoagulation, ESRD on HD on MWF, inguinal hernia, chronic abdominal catheter, comes in for dizziness, and nausea, and mild abdominal cramping, diarrhea. On arrival to ED his BP was found to be in 70/ 40's. He was given a fluid bolus of 545ml NS and his bp improved to 100/50's. As per the wife and the patient, his normal BP range is around 12'Y systolic and diastolic bp is around 48'G.  After he received the fluid bolus his dizziness improved and his nausea improved. He reports he does not have any abdominal pain. His labs revealed elevated troponin, and lactic acid. It also showed mild anemia with a hemoglobin of 10, mild leukocytosis , hyperkalemia and hyponatremia. He also reports he underwent paracentesis on Saturday and about 4 lit of fluid was removed. He denies chest pain or sob. He denies any palpitations or syncopal episodes.  He was referred to medical service for admission for abnormal labs and evaluation of his dizziness and nausea.    Review of Systems:  See HPI for pertinent positives, but the rest of the ROS is negative. .   Past Medical History  Diagnosis Date  . Hypotension   . Amyloid heart disease   . Cardiomyopathy   . Gout   . BPH (benign prostatic hypertrophy)   . Chronic atrial fibrillation 02/2003    not on anticoagulation  . Hypertension   . GERD (gastroesophageal reflux disease)   . Arthritis     ra  . CAD (coronary artery disease)     cath 2004: 30% RCA, 30% LCx, 30% LAD, EF 45-50%  . ESRD (end stage renal disease) on dialysis     Dialysis Monday , Wednesday and Fridays  . Cancer     ca of kidney   Past Surgical History  Procedure  Laterality Date  . Cholecystectomy    . Hernia repair    . Av fistula placement    . Nephrectomy      left  . Colonoscopy  07/19/2012    Procedure: COLONOSCOPY;  Surgeon: Rogene Houston, MD;  Location: [facility];  Service: Endoscopy;  Laterality: N/A;  730  . Hip arthroplasty Left 06/07/2014    Procedure: LEFT HIP HEMIARTHROPLASTY;  Surgeon: Mcarthur Rossetti, MD;  Location: [place];  Service: Orthopedics;  Laterality: Left;  . Colonoscopy N/A 10/16/2014    Procedure: COLONOSCOPY;  Surgeon: Rogene Houston, MD;  Location: [facility];  Service: Endoscopy;  Laterality: N/A;  150 - moved to 12:55 - Ann to notify pt   Social History:  reports that he quit smoking about 33 years ago. His smoking use included Cigarettes. He has a .5 pack-year smoking history. He has never used smokeless tobacco. He reports that he does not drink alcohol or use illicit drugs.  No Known Allergies  Family History  Problem Relation Age of Onset  . Cancer Mother   . Bone cancer Father   . Heart failure Father     Prior to Admission medications   Medication Sig Start Date End Date Taking? Authorizing Provider  acetaminophen (TYLENOL) 500 MG tablet Take 1,000 mg by mouth every 6 (six) hours as needed for mild pain.   Yes  Historical Provider, MD  allopurinol (ZYLOPRIM) 100 MG tablet Take 100 mg by mouth daily.   Yes Historical Provider, MD  amoxicillin (AMOXIL) 500 MG capsule Take 1 capsule by mouth daily. 11/29/14  Yes Historical Provider, MD  aspirin 81 MG tablet Take 81 mg by mouth daily.   Yes Historical Provider, MD  b complex-vitamin c-folic acid (NEPHRO-VITE) 0.8 MG TABS tablet Take 1 tablet by mouth at bedtime.   Yes Historical Provider, MD  cinacalcet (SENSIPAR) 30 MG tablet Take 30 mg by mouth daily.   Yes Historical Provider, MD  HYDROcodone-acetaminophen (NORCO/VICODIN) 5-325 MG per tablet Take 1 tablet by mouth 2 (two) times daily as needed. 11/29/14  Yes Historical Provider, MD  midodrine  (PROAMATINE) 10 MG tablet Take 10 mg by mouth 3 (three) times daily.  12/03/11  Yes Historical Provider, MD  multivitamin (RENA-VIT) TABS tablet Take 1 tablet by mouth daily.   Yes Historical Provider, MD  omeprazole (PRILOSEC) 20 MG capsule Take 20 mg by mouth 2 (two) times daily before a meal.    Yes Historical Provider, MD  Q-DRYL 12.5 MG/5ML liquid Take 5 mLs by mouth 2 (two) times daily as needed (dental infection).  11/29/14  Yes Historical Provider, MD  sevelamer (RENVELA) 800 MG tablet Take 1,600 mg by mouth 3 (three) times daily with meals.    Yes Historical Provider, MD  Venlafaxine HCl 75 MG TB24 Take 75 mg by mouth every morning.    Yes Historical Provider, MD   Physical Exam: Filed Vitals:   12/03/14 1600 12/03/14 1630 12/03/14 1700 12/03/14 1730  BP: 67/50 71/48 85/54  80/41  Pulse: 72 78 98 86  Temp:      TempSrc:      Resp:      Height:      Weight:      SpO2:        Wt Readings from Last 3 Encounters:  12/03/14 61.236 kg (135 lb)  11/07/14 62.143 kg (137 lb)  10/16/14 65.772 kg (145 lb)    General:  Appears anxious and restless on 2 lit [place] oxygen.  Eyes: PERRL, normal lids, irises & conjunctiva Neck: no LAD, masses or thyromegaly Cardiovascular: RRR, no m/r/g. No LE edema. Respiratory: diminished air entry at bases.  Abdomen: soft, mild generalized tenderness , bowel sounds are good.  Skin: no rash or induration seen on limited exam Musculoskeletal: grossly normal tone BUE/BLE Neurologic: speech normal, able to move all extremities, alert and oriented and no focal deficits. Gait could not be tested.           Labs on Admission:  Basic Metabolic Panel:  Recent Labs Lab 12/03/14 0849  NA 132*  K 5.3*  CL 94*  CO2 22  GLUCOSE 61*  BUN 62*  CREATININE 8.29*  CALCIUM 7.7*   Liver Function Tests:  Recent Labs Lab 12/03/14 0849  AST 22  ALT 15  ALKPHOS 189*  BILITOT 1.6*  PROT 6.7  ALBUMIN 1.9*    Recent Labs Lab 12/03/14 0849  LIPASE 22    No results for input(s): AMMONIA in the last 168 hours. CBC:  Recent Labs Lab 12/03/14 0849  WBC 11.9*  NEUTROABS 10.7*  HGB 10.1*  HCT 30.0*  MCV 91.5  PLT 301   Cardiac Enzymes:  Recent Labs Lab 12/03/14 0906 12/03/14 1632  TROPONINI 0.09* 0.08*    BNP (last 3 results) No results for input(s): BNP in the last 8760 hours.  ProBNP (last 3 results)  Recent Labs  02/12/14 1139 05/09/14 1100  PROBNP 21129.0* 32097.0*    CBG: No results for input(s): GLUCAP in the last 168 hours.  Radiological Exams on Admission: Dg Chest Port 1 View  12/03/2014   CLINICAL DATA:  Shortness of breath for 2 days. End-stage renal disease.  EXAM: PORTABLE CHEST - 1 VIEW  COMPARISON:  06/13/2014  FINDINGS: There is right subclavian and left IJ central venous access. The catheter tips are in the right atrium.  Stable cardiomegaly and prominence of the main pulmonary artery. Aortic and hilar contours are negative. There is no edema, consolidation, effusion, or pneumothorax.  IMPRESSION: No active disease.   Electronically Signed   By: Jorje Guild M.D.   On: 12/03/2014 08:56    EKG: atrial fibrillation with low voltage, prologed qt interval. Unchanged from previous EKG.   Assessment/Plan Active Problems:   Hypotension   Sepsis   1. Hypotension, ? Sepsis: Admit to step down. As per the family ,the BP readings are at [place], but patient feels his symptoms have improved from fluid bolus.  Blood cultures drawn and he was empirically started on broad spectrum antibiotics. Plan to d/c them in the next 24 hours, if we dont find a source.   2. Ascites: His abdomen is not distended and he doesn't appear to have ascites at this time.  Chronic abdominal catheter in place and there no signs of peritonitis or erythema or cellulitis surrounding the catheter.    3. ESRD on HD: Renal consulted and plan for HD today.   4. Hyperkalemia: Further management as per renal.    Nausea,  diarrhea: ? Gastroenteritis. Stool work up ordered .  Monitor.    Code Status: full code DVT Prophylaxis: Family Communication: discussed with wife at bedside Disposition Plan: admit to step down overnight.   Time spent: 55 min  [facility] Pager [phone]

## 2014-12-03 NOTE — ED Notes (Signed)
Pulse oximeter continues to not monitor correctly, believed to be related to poor perfusion. Pt. Alert and oriented. EDP aware of monitoring issues. BP runs low per family, EDP aware of low BP, 250 ml bolus x 2 given.

## 2014-12-03 NOTE — ED Notes (Signed)
RT at bedside to assess pt. Per EDP request.

## 2014-12-03 NOTE — Consult Note (Signed)
Reason for Consult: End-stage renal disease Referring Physician: Dr. Carmela Rima is an 69 y.o. male.  HPI: He is a patient who has history of primary diagnosis, cardiomyopathy and end-stage renal disease on maintenance hemodialysis presently came with complaints of abdominal pain and also some difficulty since Saturday. Patient had paracentesis about 3 L on Saturday. Since then he was complaining some abdominal pain associated with some nausea. Patient denies however any vomiting or diarrhea. Has also some difficulty breathing.  Past Medical History  Diagnosis Date  . Hypotension   . Amyloid heart disease   . Cardiomyopathy   . Gout   . BPH (benign prostatic hypertrophy)   . Chronic atrial fibrillation 02/2003    not on anticoagulation  . Hypertension   . GERD (gastroesophageal reflux disease)   . Arthritis     ra  . CAD (coronary artery disease)     cath 2004: 30% RCA, 30% LCx, 30% LAD, EF 45-50%  . ESRD (end stage renal disease) on dialysis     Dialysis Monday , Wednesday and Fridays  . Cancer     ca of kidney    Past Surgical History  Procedure Laterality Date  . Cholecystectomy    . Hernia repair    . Av fistula placement    . Nephrectomy      left  . Colonoscopy  07/19/2012    Procedure: COLONOSCOPY;  Surgeon: Rogene Houston, MD;  [facility];  Service: Endoscopy;  Laterality: N/A;  730  . Hip arthroplasty Left 06/07/2014    Procedure: LEFT HIP HEMIARTHROPLASTY;  Surgeon: Mcarthur Rossetti, MD;  Location: [facility];  Service: Orthopedics;  Laterality: Left;  . Colonoscopy N/A 10/16/2014    Procedure: COLONOSCOPY;  Surgeon: Rogene Houston, MD;  [facility]: [facility];  Service: Endoscopy;  Laterality: N/A;  150 - moved to 12:55 - Ann to notify pt    Family History  Problem Relation Age of Onset  . Cancer Mother   . Bone cancer Father   . Heart failure Father     Social History:  reports that he quit smoking about 33 years ago. His smoking  use included Cigarettes. He has a .5 pack-year smoking history. He has never used smokeless tobacco. He reports that he does not drink alcohol or use illicit drugs.  Allergies: No Known Allergies  Medications: I have reviewed the patient's current medications.  Results for orders placed or performed during the hospital encounter of 12/03/14 (from the past 48 hour(s))  CBC with Differential/Platelet     Status: Abnormal   Collection Time: 12/03/14  8:49 AM  Result Value Ref Range   WBC 11.9 (H) 4.0 - 10.5 K/uL   RBC 3.28 (L) 4.22 - 5.81 MIL/uL   Hemoglobin 10.1 (L) 13.0 - 17.0 g/dL   HCT 30.0 (L) 39.0 - 52.0 %   MCV 91.5 78.0 - 100.0 fL   MCH 30.8 26.0 - 34.0 pg   MCHC 33.7 30.0 - 36.0 g/dL   RDW 14.3 11.5 - 15.5 %   Platelets 301 150 - 400 K/uL   Neutrophils Relative % 91 (H) 43 - 77 %   Neutro Abs 10.7 (H) 1.7 - 7.7 K/uL   Lymphocytes Relative 5 (L) 12 - 46 %   Lymphs Abs 0.7 0.7 - 4.0 K/uL   Monocytes Relative 4 3 - 12 %   Monocytes Absolute 0.5 0.1 - 1.0 K/uL   Eosinophils Relative 0 0 - 5 %  Eosinophils Absolute 0.0 0.0 - 0.7 K/uL   Basophils Relative 0 0 - 1 %   Basophils Absolute 0.0 0.0 - 0.1 K/uL  Comprehensive metabolic panel     Status: Abnormal   Collection Time: 12/03/14  8:49 AM  Result Value Ref Range   Sodium 132 (L) 135 - 145 mmol/L   Potassium 5.3 (H) 3.5 - 5.1 mmol/L   Chloride 94 (L) 96 - 112 mmol/L   CO2 22 19 - 32 mmol/L   Glucose, Bld 61 (L) 70 - 99 mg/dL   BUN 62 (H) 6 - 23 mg/dL   Creatinine, Ser 8.29 (H) 0.50 - 1.35 mg/dL   Calcium 7.7 (L) 8.4 - 10.5 mg/dL   Total Protein 6.7 6.0 - 8.3 g/dL   Albumin 1.9 (L) 3.5 - 5.2 g/dL   AST 22 0 - 37 U/L   ALT 15 0 - 53 U/L   Alkaline Phosphatase 189 (H) 39 - 117 U/L   Total Bilirubin 1.6 (H) 0.3 - 1.2 mg/dL   GFR calc non Af Amer 6 (L) >90 mL/min   GFR calc Af Amer 7 (L) >90 mL/min    Comment: (NOTE) The eGFR has been calculated using the CKD EPI equation. This calculation has not been validated in all  clinical situations. eGFR's persistently <90 mL/min signify possible Chronic Kidney Disease.    Anion gap 16 (H) 5 - 15  Lipase, blood     Status: None   Collection Time: 12/03/14  8:49 AM  Result Value Ref Range   Lipase 22 11 - 59 U/L  I-Stat CG4 Lactic Acid, ED     Status: Abnormal   Collection Time: 12/03/14  8:54 AM  Result Value Ref Range   Lactic Acid, Venous 4.78 (HH) 0.5 - 2.0 mmol/L   Comment NOTIFIED PHYSICIAN   Culture, blood (routine x 2)     Status: None (Preliminary result)   Collection Time: 12/03/14  9:03 AM  Result Value Ref Range   Specimen Description Blood    Special Requests NONE    Culture NO GROWTH <24 HRS    Report Status PENDING   Culture, blood (routine x 2)     Status: None (Preliminary result)   Collection Time: 12/03/14  9:03 AM  Result Value Ref Range   Specimen Description Blood    Special Requests NONE    Culture NO GROWTH <24 HRS    Report Status PENDING   Troponin I     Status: Abnormal   Collection Time: 12/03/14  9:06 AM  Result Value Ref Range   Troponin I 0.09 (H) <0.031 ng/mL    Comment:        PERSISTENTLY INCREASED TROPONIN VALUES IN THE RANGE OF 0.04-0.49 ng/mL CAN BE SEEN IN:       -UNSTABLE ANGINA       -CONGESTIVE HEART FAILURE       -MYOCARDITIS       -CHEST TRAUMA       -ARRYHTHMIAS       -LATE PRESENTING MYOCARDIAL INFARCTION       -COPD   CLINICAL FOLLOW-UP RECOMMENDED.     Dg Chest Port 1 View  12/03/2014   CLINICAL DATA:  Shortness of breath for 2 days. End-stage renal disease.  EXAM: PORTABLE CHEST - 1 VIEW  COMPARISON:  06/13/2014  FINDINGS: There is right subclavian and left IJ central venous access. The catheter tips are in the right atrium.  Stable cardiomegaly and prominence of the main pulmonary  artery. Aortic and hilar contours are negative. There is no edema, consolidation, effusion, or pneumothorax.  IMPRESSION: No active disease.   Electronically Signed   By: Jorje Guild M.D.   On: 12/03/2014 08:56     Review of Systems  Constitutional: Negative for fever and chills.  Respiratory: Positive for shortness of breath.   Cardiovascular: Negative for chest pain and orthopnea.  Gastrointestinal: Positive for nausea and abdominal pain. Negative for vomiting.   Blood pressure 72/52, pulse 72, temperature 97.8 F (36.6 C), temperature source Oral, resp. rate 19, height 5' 8"  (1.727 m), weight 61.236 kg (135 lb), SpO2 98 %. Physical Exam  Neck: JVD present.  Cardiovascular: Normal rate and regular rhythm.   Respiratory: No respiratory distress. He has no wheezes.  GI: He exhibits distension. There is no tenderness. There is no rebound.  Musculoskeletal: He exhibits edema.    Assessment/Plan: Problem #1 end-stage renal disease: He is status post hemodialysis on Friday. Presently has some nausea but no vomiting. His potassium is high. Problem #2 primary amyloidosis Problem #3 severe cardiomyopathy Problem #4 severe hypotension. Patient on Midrin without significant change. Patient however is asymptomatic and comfortable with his low blood pressure. Problem #5 possible sepsis: Patient is afebrile, his white blood cell count is slightly high. This could be secondary to recent tunneled catheter exchange versus from his paracentesis. Problem #6 anemia: His hemoglobin is within our target goal. Problem #7 ascites Plan: We'll make arrangements for patient to get dialysis today We'll dialyze him for 3 hours, using 2K 2.5 calcium bath and no fluid removal. We'll check his basic metabolic panel and CBC in the morning.  Brighton Delio S 12/03/2014, 12:28 PM

## 2014-12-03 NOTE — Procedures (Signed)
   HEMODIALYSIS TREATMENT NOTE:  3 hour heparin-free dialysis completed via right chest wall tunneled catheter.  Exit site unremarkable.  No fluid removal per MD order.  Pt tolerated session without complaints.  All blood reinfused.  Porchia Sinkler L. Deven Furia, RN, CDN

## 2014-12-03 NOTE — ED Notes (Signed)
Lab at bedside, fluids to be started after blood drawn.

## 2014-12-03 NOTE — ED Notes (Signed)
MD at bedside. 

## 2014-12-03 NOTE — ED Notes (Signed)
Attempted to call report, nurse to call back, with another pt.

## 2014-12-03 NOTE — ED Notes (Signed)
Up to bathroom via wheelchair.  Pt no distress.  Small soft bowel movement.  Not enough for c diff specimen.

## 2014-12-03 NOTE — ED Notes (Signed)
Pt to dialysis  Via stretcher

## 2014-12-03 NOTE — ED Notes (Signed)
RT called

## 2014-12-03 NOTE — ED Provider Notes (Signed)
CSN: [id number]     Arrival date & time 12/03/14  0751 History  This chart was scribed for Larry Sorrow, MD by Stephania Fragmin, ED Scribe. This patient was seen in room [facility] and the patient's care was started at 8:19 AM.    Chief Complaint  Patient presents with  . Shortness of Breath   Patient is a 69 y.o. male presenting with shortness of breath. The history is provided by the patient and the spouse. No language interpreter was used.  Shortness of Breath Severity:  Moderate Onset quality:  Gradual Duration:  3 days Timing:  Constant Chronicity:  New Relieved by:  Nothing Worsened by:  Nothing tried Ineffective treatments:  None tried Associated symptoms: abdominal pain, fever and headaches   Associated symptoms: no chest pain, no cough, no rash, no sore throat and no vomiting     HPI Comments: [name] is a 69 y.o. male on dialysis Monday, Wednesdays, and Fridays who presents to the Emergency Department complaining of SOB that began 2 days ago. He complains of associated abdominal pain when "they draw too much fluid" after paracentesis, headache, subjective fever, and blurred vision. His wife also mentions patient has had reduced oral intake and fatigue.  Patient began paracentesis treatment about 3 weeks ago; he last had fluid drawn 2 days ago, per wife. Patient was last dialyzed on Friday; he would've gone to dialysis 2 hours ago, at 6:30 AM. His wife denies home oxygen use. Patient denies blood thinner use. He also denies chest pain, cough, rhinorrhea, sore throat, nausea, vomiting, diarrhea, chills, leg swelling, history of bleeding easily, rash, headache, or back pain.  PCP Dr. Gerarda Fraction   Past Medical History  Diagnosis Date  . Hypotension   . Amyloid heart disease   . Cardiomyopathy   . Gout   . BPH (benign prostatic hypertrophy)   . Chronic atrial fibrillation 02/2003    not on anticoagulation  . Hypertension   . GERD (gastroesophageal reflux disease)   . Arthritis      ra  . CAD (coronary artery disease)     cath 2004: 30% RCA, 30% LCx, 30% LAD, EF 45-50%  . ESRD (end stage renal disease) on dialysis     Dialysis Monday , Wednesday and Fridays  . Cancer     ca of kidney   Past Surgical History  Procedure Laterality Date  . Cholecystectomy    . Hernia repair    . Av fistula placement    . Nephrectomy      left  . Colonoscopy  07/19/2012    Procedure: COLONOSCOPY;  Surgeon: Rogene Houston, MD;  [facility];  Service: Endoscopy;  Laterality: N/A;  730  . Hip arthroplasty Left 06/07/2014    Procedure: LEFT HIP HEMIARTHROPLASTY;  Surgeon: Mcarthur Rossetti, MD;  Location: [facility];  Service: Orthopedics;  Laterality: Left;  . Colonoscopy N/A 10/16/2014    Procedure: COLONOSCOPY;  Surgeon: Rogene Houston, MD;  [facility];  Service: Endoscopy;  Laterality: N/A;  150 - moved to 12:55 - Ann to notify pt   Family History  Problem Relation Age of Onset  . Cancer Mother   . Bone cancer Father   . Heart failure Father    History  Substance Use Topics  . Smoking status: Former Smoker -- 0.25 packs/day for 2 years    Types: Cigarettes    Quit date: 09/25/1981  . Smokeless tobacco: Never Used  . Alcohol Use: No  Review of Systems  Constitutional: Positive for fever and fatigue. Negative for chills.  HENT: Negative for rhinorrhea and sore throat.   Eyes: Positive for visual disturbance.  Respiratory: Positive for shortness of breath. Negative for cough.   Cardiovascular: Negative for chest pain and leg swelling.  Gastrointestinal: Positive for abdominal pain. Negative for nausea, vomiting and diarrhea.  Genitourinary:       Does not urinate  Musculoskeletal: Negative for back pain.  Skin: Negative for rash.  Neurological: Positive for headaches.  Hematological: Does not bruise/bleed easily.  All other systems reviewed and are negative.     Allergies  Review of patient's allergies indicates no known  allergies.  Home Medications   Prior to Admission medications   Medication Sig Start Date End Date Taking? Authorizing Provider  acetaminophen (TYLENOL) 500 MG tablet Take 1,000 mg by mouth every 6 (six) hours as needed for mild pain.   Yes Historical Provider, MD  allopurinol (ZYLOPRIM) 100 MG tablet Take 100 mg by mouth daily.   Yes Historical Provider, MD  amoxicillin (AMOXIL) 500 MG capsule Take 1 capsule by mouth daily. 11/29/14  Yes Historical Provider, MD  aspirin 81 MG tablet Take 81 mg by mouth daily.   Yes Historical Provider, MD  b complex-vitamin c-folic acid (NEPHRO-VITE) 0.8 MG TABS tablet Take 1 tablet by mouth at bedtime.   Yes Historical Provider, MD  cinacalcet (SENSIPAR) 30 MG tablet Take 30 mg by mouth daily.   Yes Historical Provider, MD  HYDROcodone-acetaminophen (NORCO/VICODIN) 5-325 MG per tablet Take 1 tablet by mouth 2 (two) times daily as needed. 11/29/14  Yes Historical Provider, MD  midodrine (PROAMATINE) 10 MG tablet Take 10 mg by mouth 3 (three) times daily.  12/03/11  Yes Historical Provider, MD  multivitamin (RENA-VIT) TABS tablet Take 1 tablet by mouth daily.   Yes Historical Provider, MD  omeprazole (PRILOSEC) 20 MG capsule Take 20 mg by mouth 2 (two) times daily before a meal.    Yes Historical Provider, MD  Q-DRYL 12.5 MG/5ML liquid Take 5 mLs by mouth 2 (two) times daily as needed (dental infection).  11/29/14  Yes Historical Provider, MD  sevelamer (RENVELA) 800 MG tablet Take 1,600 mg by mouth 3 (three) times daily with meals.    Yes Historical Provider, MD  Venlafaxine HCl 75 MG TB24 Take 75 mg by mouth every morning.    Yes Historical Provider, MD   BP 73/45 mmHg  Pulse 78  Temp(Src) 97.8 F (36.6 C) (Oral)  Resp 18  Ht 5\' 8"  (1.727 m)  Wt 135 lb (61.236 kg)  BMI 20.53 kg/m2  SpO2 98% Physical Exam  Constitutional: He is oriented to person, place, and time. He appears well-developed and well-nourished. No distress.  HENT:  Head: Normocephalic and  atraumatic.  Mouth/Throat: Oropharynx is clear and moist.  Eyes: Conjunctivae and EOM are normal. No scleral icterus (Sclera is clear.).  Neck: Neck supple. No tracheal deviation present.  Cardiovascular: Normal rate.  An irregular rhythm present.  Pulmonary/Chest: Effort normal. No respiratory distress. He has no wheezes. He has no rales.  Temporary catheter in right chest area.  Abdominal: Soft. Bowel sounds are normal. There is no tenderness.  Mild amount of fluid in abdomen. Paracentesis catheter in place.  Musculoskeletal: Normal range of motion. Edema: No ankle swelling.  Neurological: He is alert and oriented to person, place, and time.  Skin: Skin is warm and dry.  Psychiatric: He has a normal mood and affect. His behavior is normal.  Nursing note and vitals reviewed.   ED Course  Procedures (including critical care time)  DIAGNOSTIC STUDIES: Oxygen Saturation is 97% on room air, normal by my interpretation.    Labs Review Labs Reviewed  CBC WITH DIFFERENTIAL/PLATELET - Abnormal; Notable for the following:    WBC 11.9 (*)    RBC 3.28 (*)    Hemoglobin 10.1 (*)    HCT 30.0 (*)    Neutrophils Relative % 91 (*)    Neutro Abs 10.7 (*)    Lymphocytes Relative 5 (*)    All other components within normal limits  COMPREHENSIVE METABOLIC PANEL - Abnormal; Notable for the following:    Sodium 132 (*)    Potassium 5.3 (*)    Chloride 94 (*)    Glucose, Bld 61 (*)    BUN 62 (*)    Creatinine, Ser 8.29 (*)    Calcium 7.7 (*)    Albumin 1.9 (*)    Alkaline Phosphatase 189 (*)    Total Bilirubin 1.6 (*)    GFR calc non Af Amer 6 (*)    GFR calc Af Amer 7 (*)    Anion gap 16 (*)    All other components within normal limits  TROPONIN I - Abnormal; Notable for the following:    Troponin I 0.09 (*)    All other components within normal limits  I-STAT CG4 LACTIC ACID, ED - Abnormal; Notable for the following:    Lactic Acid, Venous 4.78 (*)    All other components within  normal limits  CULTURE, BLOOD (ROUTINE X 2)  CULTURE, BLOOD (ROUTINE X 2)  LIPASE, BLOOD    Imaging Review Dg Chest Port 1 View  12/03/2014   CLINICAL DATA:  Shortness of breath for 2 days. End-stage renal disease.  EXAM: PORTABLE CHEST - 1 VIEW  COMPARISON:  06/13/2014  FINDINGS: There is right subclavian and left IJ central venous access. The catheter tips are in the right atrium.  Stable cardiomegaly and prominence of the main pulmonary artery. Aortic and hilar contours are negative. There is no edema, consolidation, effusion, or pneumothorax.  IMPRESSION: No active disease.   Electronically Signed   By: Jorje Guild M.D.   On: 12/03/2014 08:56     EKG Interpretation   Date/Time:  Monday December 03 2014 08:21:09 EST Ventricular Rate:  117 PR Interval:    QRS Duration: 75 QT Interval:  412 QTC Calculation: 575 R Axis:   -123 Text Interpretation:  Atrial fibrillation Low voltage, extremity and  precordial leads Abnormal R-wave progression, early transition Borderline  repolarization abnormality Prolonged QT interval Artifact Confirmed by  Aadvika Konen  MD, Zhion Pevehouse ([id number]) on 12/03/2014 8:33:29 AM      CRITICAL CARE Performed by: [name] Total critical care time: 30 Critical care time was exclusive of separately billable procedures and treating other patients. Critical care was necessary to treat or prevent imminent or life-threatening deterioration. Critical care was time spent personally by me on the following activities: development of treatment plan with patient and/or surrogate as well as nursing, discussions with consultants, evaluation of patient's response to treatment, examination of patient, obtaining history from patient or surrogate, ordering and performing treatments and interventions, ordering and review of laboratory studies, ordering and review of radiographic studies, pulse oximetry and re-evaluation of patient's condition.     MDM   Final diagnoses:  SOB  (shortness of breath)  Renal failure  Sepsis, due to unspecified organism   Patient's presentation for shortness of breath starting 3  days ago. Patient stated started not feeling well shortness breaths gradually gotten worse. Patient normally not on oxygen at home. Patient does receive dialysis normally dialyzed Monday Wednesdays and Fridays. Patient according to wife normally has chronically low blood pressure use he systolics around 71- 72.  Patient's blood pressure here is low eyes blood pressure we've had is been the 73 systolic. Had blood pressures as low as 57 systolic. Patient's been receiving gentle fluid boluses is now received 500 mL of normal saline. Patient's chest x-ray was negative for any fluid or edema. Or evidence of pneumonia. Patient also has a peritoneal catheter in place to remove ascitic fluid on a regular basis. This is done by home nurse. The catheters been in place for 3 weeks. A shunt is therefore at risk for peritonitis. He appears to be no evidence of pneumonia to explain the shortness of breath or pulmonary edema. Most likely some sort of infectious source. Although patient not febrile. Lactic acid is elevated but usually is elevated in this patient. Blood cultures done patient started on broad-spectrum antibiotics. Patient evaluated by respiratory therapy as well. Patient mentating very well at the moment. Patient does not require BiPAP or intubation. Patient will be admitted to step down unit by the hospitalist. Also nephrology notified. They're aware of patient's potassium of 5.3. Patient given calcium gluconate for that to stabilize the heart. They will consult on the patient. Hospitalist will admit to the stepdown unit temporary middle orders completed.    I personally performed the services described in this documentation, which was scribed in my presence. The recorded information has been reviewed and is accurate.      Larry Sorrow, MD 12/03/14 1049

## 2014-12-04 ENCOUNTER — Ambulatory Visit (INDEPENDENT_AMBULATORY_CARE_PROVIDER_SITE_OTHER): Payer: Medicare Other | Admitting: Internal Medicine

## 2014-12-04 LAB — BASIC METABOLIC PANEL
Anion gap: 11 (ref 5–15)
BUN: 36 mg/dL — ABNORMAL HIGH (ref 6–23)
CHLORIDE: 101 mmol/L (ref 96–112)
CO2: 22 mmol/L (ref 19–32)
Calcium: 8 mg/dL — ABNORMAL LOW (ref 8.4–10.5)
Creatinine, Ser: 5.74 mg/dL — ABNORMAL HIGH (ref 0.50–1.35)
GFR calc Af Amer: 11 mL/min — ABNORMAL LOW (ref >90)
GFR calc non Af Amer: 9 mL/min — ABNORMAL LOW (ref >90)
GLUCOSE: 63 mg/dL — AB (ref 70–99)
POTASSIUM: 4.9 mmol/L (ref 3.5–5.1)
Sodium: 134 mmol/L — ABNORMAL LOW (ref 135–145)

## 2014-12-04 LAB — CBC
HCT: 29.6 % — ABNORMAL LOW (ref 39.0–52.0)
HEMOGLOBIN: 9.9 g/dL — AB (ref 13.0–17.0)
MCH: 30.7 pg (ref 26.0–34.0)
MCHC: 33.4 g/dL (ref 30.0–36.0)
MCV: 91.9 fL (ref 78.0–100.0)
Platelets: 252 10*3/uL (ref 150–400)
RBC: 3.22 MIL/uL — ABNORMAL LOW (ref 4.22–5.81)
RDW: 14.2 % (ref 11.5–15.5)
WBC: 10 10*3/uL (ref 4.0–10.5)

## 2014-12-04 LAB — HEPATITIS B SURFACE ANTIGEN: Hepatitis B Surface Ag: NEGATIVE

## 2014-12-04 LAB — TROPONIN I: Troponin I: 0.09 ng/mL — ABNORMAL HIGH (ref <0.031)

## 2014-12-04 NOTE — Progress Notes (Signed)
[facility] PROGRESS NOTE  [name] EZM:[id number] DOB: [birth year] DOA: 12/03/2014 PCP: Glo Herring., MD iNTERIM SUMMARY: [name] is a 69 y.o. male with prior h/o amyloid heart disease, cardiomyopathy, chronic atrial fibrillation not on anticoagulation, ESRD on HD on MWF, inguinal hernia, chronic abdominal catheter, comes in for dizziness, and nausea, and mild abdominal cramping, diarrhea. On arrival to ED his BP was found to be in 70/ 40's. He was given a fluid bolus of 530ml NS and his bp improved to 100/50's. As per the wife and the patient, his normal BP range is around 50'P systolic and diastolic bp is around 54'S. He was admitted for questionable sepsis, blood cultures drawn and started him on broad spectrum antibiotics. Unclear if this episode is an occult infection vs  From dehydration secondary to excessive removal of ascitic fluid on Saturday. All his symptoms appear to have resolved . His blood cultures so far are negative. Plan to resume IV antibiotics for a total of 48 hours and stop them if the blood cultures are negative.   Assessment/Plan: Hypotension, ? Sepsis: Admitted to step down. As per the family ,the BP readings are at basleine, but patient feels his symptoms have resolved after HD ad fluid boluses from yesterday. Blood cultures drawn and he was empirically started on broad spectrum antibiotics. Plan to d/c them in the next 24 hours, if we dont find a source.   2. Ascites: Mildly distended.   Chronic abdominal catheter in place and there no signs of peritonitis or erythema or cellulitis surrounding the catheter.    3. ESRD on HD: Renal consulted and plan for HD tomorrow before discharge.   4. Hyperkalemia: resolved with HD Further management as per renal.    Nausea, diarrhea: ? Gastroenteritis. Stool work up ordered .  Monitor.   Code Status: FULL CODE Family Communication: son at bedside Disposition Plan: possibly discharge home if cultures  are negative.    Consultants:  RENAL.  Procedures:  HD MWF  Antibiotics:  Vancomycin 2/8  Zosyn 2/8  HPI/Subjective: Dizziness resolved.blurry vision is gone, no sob or abdominal pain. No nausea or vomiting.   Objective: Filed Vitals:   12/04/14 1300  BP: 71/48  Pulse:   Temp:   Resp: 11    Intake/Output Summary (Last 24 hours) at 12/04/14 1433 Last data filed at 12/04/14 0400  Gross per 24 hour  Intake    810 ml  Output   -100 ml  Net    910 ml   Filed Weights   12/03/14 0901 12/03/14 1900 12/04/14 0345  Weight: 61.236 kg (135 lb) 56.2 kg (123 lb 14.4 oz) 59.1 kg (130 lb 4.7 oz)    Exam:   General:  Alert afebrile comfortable  Cardiovascular: s1s2  Respiratory: clear to ausculation, no wheezing or rhonchi  Abdomen: soft mildly distended when compared to yesterday, no tender ness , bowel sounds heard  Musculoskeletal: no pedal edema.   Data Reviewed: Basic Metabolic Panel:  Recent Labs Lab 12/03/14 0849 12/04/14 0319  NA 132* 134*  K 5.3* 4.9  CL 94* 101  CO2 22 22  GLUCOSE 61* 63*  BUN 62* 36*  CREATININE 8.29* 5.74*  CALCIUM 7.7* 8.0*   Liver Function Tests:  Recent Labs Lab 12/03/14 0849  AST 22  ALT 15  ALKPHOS 189*  BILITOT 1.6*  PROT 6.7  ALBUMIN 1.9*    Recent Labs Lab 12/03/14 0849  LIPASE 22   No results for input(s): AMMONIA in the  last 168 hours. CBC:  Recent Labs Lab 12/03/14 0849 12/04/14 0319  WBC 11.9* 10.0  NEUTROABS 10.7*  --   HGB 10.1* 9.9*  HCT 30.0* 29.6*  MCV 91.5 91.9  PLT 301 252   Cardiac Enzymes:  Recent Labs Lab 12/03/14 0906 12/03/14 1632 12/03/14 2054 12/04/14 0319  TROPONINI 0.09* 0.08* 0.08* 0.09*   BNP (last 3 results) No results for input(s): BNP in the last 8760 hours.  ProBNP (last 3 results)  Recent Labs  02/12/14 1139 05/09/14 1100  PROBNP 21129.0* 32097.0*    CBG: No results for input(s): GLUCAP in the last 168 hours.  Recent Results (from the past 240  hour(s))  Culture, blood (routine x 2)     Status: None (Preliminary result)   Collection Time: 12/03/14  9:03 AM  Result Value Ref Range Status   Specimen Description BLOOD RIGHT HAND  Final   Special Requests BOTTLES DRAWN AEROBIC ONLY 4CC  Final   Culture NO GROWTH 1 DAY  Final   Report Status PENDING  Incomplete  Culture, blood (routine x 2)     Status: None (Preliminary result)   Collection Time: 12/03/14  9:03 AM  Result Value Ref Range Status   Specimen Description BLOOD RIGHT HAND  Final   Special Requests BOTTLES DRAWN AEROBIC ONLY 4CC  Final   Culture NO GROWTH 1 DAY  Final   Report Status PENDING  Incomplete  MRSA PCR Screening     Status: Abnormal   Collection Time: 12/03/14  7:15 PM  Result Value Ref Range Status   MRSA by PCR POSITIVE (A) NEGATIVE Final    Comment:        The GeneXpert MRSA Assay (FDA approved for NASAL specimens only), is one component of a comprehensive MRSA colonization surveillance program. It is not intended to diagnose MRSA infection nor to guide or monitor treatment for MRSA infections. RESULT CALLED TO, READ BACK BY AND VERIFIED WITH: DANIELS,J ON 12/03/14 AT 2225 BY LOY,C      Studies: Dg Chest Port 1 View  12/03/2014   CLINICAL DATA:  Shortness of breath for 2 days. End-stage renal disease.  EXAM: PORTABLE CHEST - 1 VIEW  COMPARISON:  06/13/2014  FINDINGS: There is right subclavian and left IJ central venous access. The catheter tips are in the right atrium.  Stable cardiomegaly and prominence of the main pulmonary artery. Aortic and hilar contours are negative. There is no edema, consolidation, effusion, or pneumothorax.  IMPRESSION: No active disease.   Electronically Signed   By: Jorje Guild M.D.   On: 12/03/2014 08:56    Scheduled Meds: . aspirin  81 mg Oral Daily  . cinacalcet  30 mg Oral Q breakfast  . midodrine  10 mg Oral TID WC  . piperacillin-tazobactam (ZOSYN)  IV  2.25 g Intravenous Q8H  . sevelamer carbonate  1,600 mg  Oral TID WC  . [START ON 12/05/2014] vancomycin  750 mg Intravenous Q M,W,F-HD  . venlafaxine XR  75 mg Oral q morning - 10a   Continuous Infusions: . sodium chloride 10 mL/hr at 12/04/14 1151    Active Problems:   Hypotension   Sepsis    Time spent: 20 minutes.     [facility] Pager [phone]. If 7PM-7AM, please contact night-coverage at [email or web address], password [facility] 12/04/2014, 2:33 PM  LOS: 1 day

## 2014-12-04 NOTE — Clinical Documentation Improvement (Signed)
MD's, NP's, and PA's   Patient with diagnosis of "cardiomyopathy" if appropriate please document type.  Thank you  Possible Clinical Conditions?  Dilated  Idiopathic  Restrictive/Constrictive  Non Ischemic  Ishcemic   Other Condition  Cannot Clinically Determine    Thank You, Ree Kida ,RN Clinical Documentation Specialist:  [phone]  [place] Information Management

## 2014-12-04 NOTE — Progress Notes (Signed)
[name]  MRN: [id number]  DOB/AGE: 69-Oct-1947 69 y.o.  Primary Care Physician:FUSCO,LAWRENCE J., MD  Admit date: 12/03/2014  Chief Complaint:  Chief Complaint  Patient presents with  . Shortness of Breath    S-Pt presented on  12/03/2014 with  Chief Complaint  Patient presents with  . Shortness of Breath  .    Pt today feels better  Meds . aspirin  81 mg Oral Daily  . cinacalcet  30 mg Oral Q breakfast  . midodrine  10 mg Oral TID WC  . piperacillin-tazobactam (ZOSYN)  IV  2.25 g Intravenous Q8H  . sevelamer carbonate  1,600 mg Oral TID WC  . [START ON 12/05/2014] vancomycin  750 mg Intravenous Q M,W,F-HD  . venlafaxine XR  75 mg Oral q morning - 10a        Physical Exam: Vital signs in last 24 hours: Temp:  [97.7 F (36.5 C)-98.3 F (36.8 C)] 98.3 F (36.8 C) (02/09 0345) Pulse Rate:  [36-122] 66 (02/09 0615) Resp:  [12-27] 14 (02/09 0615) BP: (54-134)/(35-98) 69/43 mmHg (02/09 0615) SpO2:  [76 %-98 %] 94 % (02/09 0615) Weight:  [123 lb 14.4 oz (56.2 kg)-135 lb (61.236 kg)] 130 lb 4.7 oz (59.1 kg) (02/09 0345) Weight change:  Last BM Date: 12/03/14  Intake/Output from previous day: 02/08 0701 - 02/09 0700 In: 45 [P.O.:240; I.V.:570] Out: -100      Physical Exam: General- pt is awake,alert, oriented to time place and person Resp- No acute REsp distress, CTA B/L NO Rhonchi CVS- S1S2 regular in rate and rhythm GIT- BS+, soft, NT, ND EXT- NO LE Edema, Cyanosis Access- PC insitu  Lab Results: CBC  Recent Labs  12/03/14 0849 12/04/14 0319  WBC 11.9* 10.0  HGB 10.1* 9.9*  HCT 30.0* 29.6*  PLT 301 252    BMET  Recent Labs  12/03/14 0849 12/04/14 0319  NA 132* 134*  K 5.3* 4.9  CL 94* 101  CO2 22 22  GLUCOSE 61* 63*  BUN 62* 36*  CREATININE 8.29* 5.74*  CALCIUM 7.7* 8.0*    MICRO Recent Results (from the past 240 hour(s))  Culture, blood (routine x 2)     Status: None (Preliminary result)   Collection Time: 12/03/14  9:03 AM   Result Value Ref Range Status   Specimen Description Blood  Final   Special Requests NONE  Final   Culture NO GROWTH <24 HRS  Final   Report Status PENDING  Incomplete  Culture, blood (routine x 2)     Status: None (Preliminary result)   Collection Time: 12/03/14  9:03 AM  Result Value Ref Range Status   Specimen Description Blood  Final   Special Requests NONE  Final   Culture NO GROWTH <24 HRS  Final   Report Status PENDING  Incomplete  MRSA PCR Screening     Status: Abnormal   Collection Time: 12/03/14  7:15 PM  Result Value Ref Range Status   MRSA by PCR POSITIVE (A) NEGATIVE Final    Comment:        The GeneXpert MRSA Assay (FDA approved for NASAL specimens only), is one component of a comprehensive MRSA colonization surveillance program. It is not intended to diagnose MRSA infection nor to guide or monitor treatment for MRSA infections. RESULT CALLED TO, READ BACK BY AND VERIFIED WITH: DANIELS,J ON 12/03/14 AT 2225 BY LOY,C       Lab Results  Component Value Date   CALCIUM 8.0* 12/04/2014  PHOS 2.2* 06/11/2014       Impression: 1)Renal  ESRD on HD                On MWF schedule                 Pt was dialyzed yesterday                 No need of HD today    2)Hypotension                On Midodrine   3)Anemia HGb at goal (9--11)   4)CKD Mineral-Bone Disorder Secondary Hyperparathyroidism present .      On Cinacalcet Phosphorus low-will recheck Calcium when corrected for low albumin is at goal.  5)CHF- hx of Cardiomyopathy Primary MD following  6)Electrolytes  Hyperkalemic     Now better  Hyponatremic     improving  7)Acid base Co2 at goal  8)Liver- Cirrhosis    Had Ascitic tap recently    Stable  9) ID- ? Sepsis from Cath exchange  Vs Paracentesis    On Vanco + Zosyn     Stable   Plan:  Pt had HD yesterday, no need of Hd today Will dialyze in am       Keir Viernes S 12/04/2014, 8:56 AM

## 2014-12-04 NOTE — Care Management Utilization Note (Signed)
UR completed 

## 2014-12-04 NOTE — Care Management Note (Addendum)
    Page 1 of 1   12/06/2014     2:39:37 PM CARE MANAGEMENT NOTE 12/06/2014  Patient:  [name], [name]   Account Number:  [id number]  Date Initiated:  12/04/2014  Documentation initiated by:  Jolene Provost  Subjective/Objective Assessment:   Pt is from home, lives with wife, admitted for sepsis. Pt uses walker at home but has no other DME's or med needs. Pt has [place] RN through [facility] that comes 2x week. Pt plans to dsicharge home with resumption of [place] services.     Action/Plan:   PT eval pending. Will continue to follow for CM needs.   Anticipated DC Date:  12/07/2014   Anticipated DC Plan:  [name]  CM consult      [facility] Choice  Resumption Of Svcs/PTA [facility]an [place]   Choice offered to / List presented to:  C-1 Patient        [place] arranged  HH-1 RN      [place]   Status of service:  Completed, signed off Medicare Important Message given?  YES (If response is "NO", the following Medicare IM given date fields will be blank) Date Medicare IM given:  12/06/2014 Medicare IM given by:  Jolene Provost Date Additional Medicare IM given:   Additional Medicare IM given by:    Discharge Disposition:  [place]  Per UR Regulation:  Reviewed for med. necessity/level of care/duration of stay  If discussed at [place] of Stay Meetings, dates discussed:    Comments:  12/06/2014 [place], RN, MSN, CM Pt discharging home today with resumption of [facility] services through [facility]. [facility] aware of discharge and will continue to see pt in home. No CM needs identified at this time. 12/04/2014 [place], RN, MSN, CM

## 2014-12-05 DIAGNOSIS — Z992 Dependence on renal dialysis: Secondary | ICD-10-CM

## 2014-12-05 DIAGNOSIS — I5032 Chronic diastolic (congestive) heart failure: Secondary | ICD-10-CM

## 2014-12-05 DIAGNOSIS — E854 Organ-limited amyloidosis: Secondary | ICD-10-CM

## 2014-12-05 DIAGNOSIS — I43 Cardiomyopathy in diseases classified elsewhere: Secondary | ICD-10-CM

## 2014-12-05 DIAGNOSIS — I959 Hypotension, unspecified: Secondary | ICD-10-CM

## 2014-12-05 DIAGNOSIS — N186 End stage renal disease: Secondary | ICD-10-CM

## 2014-12-05 MED ORDER — ALBUMIN HUMAN 25 % IV SOLN
INTRAVENOUS | Status: AC
  Administered 2014-12-05: 25 g via INTRAVENOUS
  Filled 2014-12-05: qty 100

## 2014-12-05 MED ORDER — ALBUMIN HUMAN 25 % IV SOLN
25.0000 g | Freq: Once | INTRAVENOUS | Status: AC
  Administered 2014-12-05: 25 g via INTRAVENOUS
  Filled 2014-12-05: qty 100

## 2014-12-05 MED ORDER — CHLORHEXIDINE GLUCONATE CLOTH 2 % EX PADS
6.0000 | MEDICATED_PAD | Freq: Every day | CUTANEOUS | Status: DC
  Administered 2014-12-05 – 2014-12-06 (×2): 6 via TOPICAL

## 2014-12-05 MED ORDER — MUPIROCIN 2 % EX OINT
1.0000 "application " | TOPICAL_OINTMENT | Freq: Two times a day (BID) | CUTANEOUS | Status: DC
  Administered 2014-12-05 – 2014-12-06 (×3): 1 via NASAL
  Filled 2014-12-05: qty 22

## 2014-12-05 NOTE — Evaluation (Signed)
Physical Therapy Evaluation Patient Details Name: SHAMON COTHRAN MRN: [id number] DOB: 1946/08/24 Today's Date: 12/05/2014   History of Present Illness  Renaldo L Lewers is a 69 y.o. male with prior h/o amyloid heart disease, cardiomyopathy, chronic atrial fibrillation not on anticoagulation, ESRD on HD on MWF, inguinal hernia, chronic abdominal catheter, comes in for dizziness, and nausea, and mild abdominal cramping, diarrhea. On arrival to ED his BP was found to be in 70/ 40's. He was given a fluid bolus of 540ml NS and his bp improved to 100/50's. As per the wife and the patient, his normal BP range is around 87'O systolic and diastolic bp is around 67'E.  After he received the fluid bolus his dizziness improved and his nausea improved. He reports he does not have any abdominal pain. His labs revealed elevated troponin, and lactic acid. It also showed mild anemia with a hemoglobin of 10, mild leukocytosis , hyperkalemia and hyponatremia. He also reports he underwent paracentesis on Saturday and about 4 lit of fluid was removed. He denies chest pain or sob. He denies any palpitations or syncopal episodes.   Clinical Impression  Pt is a 69 year old male who presents to PT with dx of sepsis.  Pt has a hx of THA ~5 months ago, and pt/family report he has walked with a RW since surgery.  During evaluation, pt was (I) with bed mobility skills, min assist with transfers from chair and min guard for transfers from EOB, and min guard/superivsion and use of RW for gait in room.  Gait distance limited secondary to fatigue.  Pt/family reports pt will have 24/7 assist at home if needed. Recommend continued PT in the hospital to address activity tolerance and assess stairs; no follow up PT recommended as pt/family reports he will not need it.  No DME recommendations.      Follow Up Recommendations No PT follow up    Equipment Recommendations  None recommended by PT       Precautions / Restrictions  Precautions Precautions: Fall Restrictions Weight Bearing Restrictions: No      Mobility  Bed Mobility Overal bed mobility: Independent                Transfers Overall transfer level: Needs assistance Equipment used: Rolling walker (2 wheeled) Transfers: Sit to/from [facility] Sit to Stand: Min assist;Min guard Stand pivot transfers: Supervision;Min guard       General transfer comment: Min assist with STS from chair, and min guard for transfer off EOB  Ambulation/Gait Ambulation/Gait assistance: Supervision;Min guard Ambulation Distance (Feet): 20 Feet Assistive device: Rolling walker (2 wheeled) Gait Pattern/deviations: Step-through pattern;Decreased dorsiflexion - right;Decreased dorsiflexion - left   Gait velocity interpretation: Below normal speed for age/gender General Gait Details: Gait distance limited secondary to fatigue with activity     Balance Overall balance assessment: No apparent balance deficits (not formally assessed)                                           Pertinent Vitals/Pain Pain Assessment: No/denies pain    Home Living Family/patient expects to be discharged to:: Private residence Living Arrangements: Spouse/significant other Available Help at Discharge: Family;Available 24 hours/day Type of Home: House Home Access: Stairs to enter Entrance Stairs-Rails: Can reach both Entrance Stairs-Number of Steps: 2-3 Home Layout: One level Home Equipment: Walker - 2 wheels;Grab bars - tub/shower;Grab  bars - toilet;Shower seat 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    ADL's / Homemaking Assistance Needed: Pt reprots his wife does the cooking  and cleaning.  Pt usually sponge baths.         Hand Dominance   Dominant Hand: Right    Extremity/Trunk Assessment               Lower Extremity Assessment: Generalized weakness         Communication   Communication: No difficulties  Cognition Arousal/Alertness: Awake/alert Behavior During Therapy: WFL for tasks assessed/performed Overall Cognitive Status: Within Functional Limits for tasks assessed                       Assessment/Plan    PT Assessment Patient needs continued PT services  PT Diagnosis Generalized weakness   PT Problem List Decreased strength;Decreased activity tolerance;Decreased balance;Decreased mobility  PT Treatment Interventions Stair training;Functional mobility training;Therapeutic activities;Therapeutic exercise   PT Goals (Current goals can be found in the Care Plan section) Acute Rehab PT Goals Patient Stated Goal: go home PT Goal Formulation: With patient Time For Goal Achievement: 12/19/14 Potential to Achieve Goals: Good    Frequency Min 2X/week    End of Session Equipment Utilized During Treatment: Gait belt Activity Tolerance: Patient limited by fatigue Patient left: in chair;with family/visitor present Nurse Communication: Mobility status         Time: 0071-2197 PT Time Calculation (min) (ACUTE ONLY): 15 min   Charges:   PT Evaluation $Initial PT Evaluation Tier I: 1 Procedure     Lonna Cobb, DPT [phone]  12/05/2014, 12:15 PM

## 2014-12-05 NOTE — Procedures (Signed)
   HEMODIALYSIS TREATMENT NOTE:  3 hour heparin-free HD completed.  Catheter exit site unremarkble.  Unable to tolerate ultrafiltration; SBP 60s and 70s despite Albumin administration.  Kept even.  All blood reinfused.  Report given to Burna Sis, RN.  Rockwell Alexandria, RN, CDN

## 2014-12-05 NOTE — Plan of Care (Signed)
Problem: Phase I Progression Outcomes Goal: Pain controlled with appropriate interventions Outcome: Progressing Denies pain at this time Goal: OOB as tolerated unless otherwise ordered Outcome: Progressing One assist Goal: Initial discharge plan identified Outcome: [place] with family Goal: Voiding-avoid urinary catheter unless indicated Outcome: Not Applicable Date Met:  20/03/79 Long term dialysis, makes minimal to no urine

## 2014-12-05 NOTE — Progress Notes (Signed)
[facility] PROGRESS NOTE  [name] LYY:[id number] DOB: 02-22-1946 DOA: 12/03/2014 PCP: Glo Herring., MD  Assessment/Plan: 1. Chronic hypotension. Due to the patient's multiple comorbidities, he chronically runs a low blood pressure with a systolic pressure in the 12X. He is usually asymptomatic with these findings, but more recently has developed dizziness, nausea. He did report some improvement when arriving to the emergency room and received a bolus of IV fluids. Clinically, he is not appear septic or toxic at this time. Discussed with nephrology regarding further IV hydration. Per nephrology, he will not have any fluid removal during dialysis today. He will also be receiving albumin today which should hopefully help his overall volume status. We'll reassess tomorrow. Blood cultures have not shown any growth to date. Will discontinue antibiotics tomorrow if blood culture remain negative 2. End-stage renal disease and hemodialysis. Patient underwent dialysis today as per schedule. 3. Nausea/diarrhea. Question gastroenteritis. He does not appear to have had any significant stool output since admission, since stool studies still need to be collected. 4. Ascites. Has chronic peritoneal drain placed. Evidence of liver cirrhosis on recent imaging in 09/2014. 5. Cardiac amyloidosis. Follow-up at [facility].  Code Status: full code Family Communication: discussed with patient, wife and daughter at the bedside Disposition Plan: discharge home once improved   Consultants:  Nephrology  Procedures:    Antibiotics:  Vancomycin 2/8>>  Zosyn 2/8>>  HPI/Subjective: Patient seen sitting in chair. Denies any shortness of breath. Feels dizzy on standing.  Objective: Filed Vitals:   12/05/14 0900  BP: 74/54  Pulse: 56  Temp:   Resp: 18    Intake/Output Summary (Last 24 hours) at 12/05/14 1051 Last data filed at 12/05/14 0334  Gross per 24 hour  Intake    250 ml   Output      0 ml  Net    250 ml   Filed Weights   12/03/14 1900 12/04/14 0345 12/05/14 0500  Weight: 56.2 kg (123 lb 14.4 oz) 59.1 kg (130 lb 4.7 oz) 60 kg (132 lb 4.4 oz)    Exam:   General:  NAD  Cardiovascular: S1, S2 RRR  Respiratory: CTA B  Abdomen: soft, mildly distended, bs+  Musculoskeletal: no edema b/l   Data Reviewed: Basic Metabolic Panel:  Recent Labs Lab 12/03/14 0849 12/04/14 0319  NA 132* 134*  K 5.3* 4.9  CL 94* 101  CO2 22 22  GLUCOSE 61* 63*  BUN 62* 36*  CREATININE 8.29* 5.74*  CALCIUM 7.7* 8.0*   Liver Function Tests:  Recent Labs Lab 12/03/14 0849  AST 22  ALT 15  ALKPHOS 189*  BILITOT 1.6*  PROT 6.7  ALBUMIN 1.9*    Recent Labs Lab 12/03/14 0849  LIPASE 22   No results for input(s): AMMONIA in the last 168 hours. CBC:  Recent Labs Lab 12/03/14 0849 12/04/14 0319  WBC 11.9* 10.0  NEUTROABS 10.7*  --   HGB 10.1* 9.9*  HCT 30.0* 29.6*  MCV 91.5 91.9  PLT 301 252   Cardiac Enzymes:  Recent Labs Lab 12/03/14 0906 12/03/14 1632 12/03/14 2054 12/04/14 0319  TROPONINI 0.09* 0.08* 0.08* 0.09*   BNP (last 3 results) No results for input(s): BNP in the last 8760 hours.  ProBNP (last 3 results)  Recent Labs  02/12/14 1139 05/09/14 1100  PROBNP 21129.0* 32097.0*    CBG: No results for input(s): GLUCAP in the last 168 hours.  Recent Results (from the past 240 hour(s))  Culture, blood (routine x  2)     Status: None (Preliminary result)   Collection Time: 12/03/14  9:03 AM  Result Value Ref Range Status   Specimen Description BLOOD RIGHT HAND  Final   Special Requests BOTTLES DRAWN AEROBIC ONLY 4CC  Final   Culture NO GROWTH 2 DAYS  Final   Report Status PENDING  Incomplete  Culture, blood (routine x 2)     Status: None (Preliminary result)   Collection Time: 12/03/14  9:03 AM  Result Value Ref Range Status   Specimen Description BLOOD RIGHT HAND  Final   Special Requests BOTTLES DRAWN AEROBIC ONLY 4CC   Final   Culture NO GROWTH 2 DAYS  Final   Report Status PENDING  Incomplete  MRSA PCR Screening     Status: Abnormal   Collection Time: 12/03/14  7:15 PM  Result Value Ref Range Status   MRSA by PCR POSITIVE (A) NEGATIVE Final    Comment:        The GeneXpert MRSA Assay (FDA approved for NASAL specimens only), is one component of a comprehensive MRSA colonization surveillance program. It is not intended to diagnose MRSA infection nor to guide or monitor treatment for MRSA infections. RESULT CALLED TO, READ BACK BY AND VERIFIED WITH: DANIELS,J ON 12/03/14 AT 2225 BY LOY,C      Studies: No results found.  Scheduled Meds: . aspirin  81 mg Oral Daily  . cinacalcet  30 mg Oral Q breakfast  . midodrine  10 mg Oral TID WC  . piperacillin-tazobactam (ZOSYN)  IV  2.25 g Intravenous Q8H  . sevelamer carbonate  1,600 mg Oral TID WC  . vancomycin  750 mg Intravenous Q M,W,F-HD  . venlafaxine XR  75 mg Oral q morning - 10a   Continuous Infusions: . sodium chloride 10 mL/hr at 12/04/14 1151    Active Problems:   Hypotension   Sepsis   Amyloid heart disease   Chronic diastolic congestive heart failure, NYHA class 2    Time spent: 58mins    MEMON,JEHANZEB  [facility] Pager [phone]. If 7PM-7AM, please contact night-coverage at [email or web address], password [facility] 12/05/2014, 10:51 AM  LOS: 2 days

## 2014-12-05 NOTE — Progress Notes (Addendum)
[name]  MRN: [id number]  DOB/AGE: 69/31/1947 69 y.o.  Primary Care Physician:FUSCO,LAWRENCE J., MD  Admit date: 12/03/2014  Chief Complaint:  Chief Complaint  Patient presents with  . Shortness of Breath    S-Pt presented on  12/03/2014 with  Chief Complaint  Patient presents with  . Shortness of Breath  .    Pt today feels better  Meds . aspirin  81 mg Oral Daily  . cinacalcet  30 mg Oral Q breakfast  . midodrine  10 mg Oral TID WC  . piperacillin-tazobactam (ZOSYN)  IV  2.25 g Intravenous Q8H  . sevelamer carbonate  1,600 mg Oral TID WC  . vancomycin  750 mg Intravenous Q M,W,F-HD  . venlafaxine XR  75 mg Oral q morning - 10a        Physical Exam: Vital signs in last 24 hours: Temp:  [97.5 F (36.4 C)-98.4 F (36.9 C)] 97.7 F (36.5 C) (02/10 0756) Pulse Rate:  [54-97] 56 (02/10 0900) Resp:  [9-21] 18 (02/10 0900) BP: (63-78)/(44-58) 74/54 mmHg (02/10 0900) SpO2:  [79 %-100 %] 91 % (02/10 0900) FiO2 (%):  [21 %] 21 % (02/10 0400) Weight:  [132 lb 4.4 oz (60 kg)] 132 lb 4.4 oz (60 kg) (02/10 0500) Weight change: -2 lb 11.6 oz (-1.235 kg) Last BM Date: 12/04/14  Intake/Output from previous day: 02/09 0701 - 02/10 0700 In: 250 [IV Piggyback:250] Out: -      Physical Exam: General- pt is awake,alert, oriented to time place and person Resp- No acute REsp distress, CTA B/L NO Rhonchi CVS- S1S2 regular in rate and rhythm GIT- BS+, soft, NT, ND EXT- NO LE Edema, Cyanosis Access- PC insitu  Lab Results: CBC  Recent Labs  12/03/14 0849 12/04/14 0319  WBC 11.9* 10.0  HGB 10.1* 9.9*  HCT 30.0* 29.6*  PLT 301 252    BMET  Recent Labs  12/03/14 0849 12/04/14 0319  NA 132* 134*  K 5.3* 4.9  CL 94* 101  CO2 22 22  GLUCOSE 61* 63*  BUN 62* 36*  CREATININE 8.29* 5.74*  CALCIUM 7.7* 8.0*    MICRO Recent Results (from the past 240 hour(s))  Culture, blood (routine x 2)     Status: None (Preliminary result)   Collection Time: 12/03/14   9:03 AM  Result Value Ref Range Status   Specimen Description BLOOD RIGHT HAND  Final   Special Requests BOTTLES DRAWN AEROBIC ONLY 4CC  Final   Culture NO GROWTH 1 DAY  Final   Report Status PENDING  Incomplete  Culture, blood (routine x 2)     Status: None (Preliminary result)   Collection Time: 12/03/14  9:03 AM  Result Value Ref Range Status   Specimen Description BLOOD RIGHT HAND  Final   Special Requests BOTTLES DRAWN AEROBIC ONLY 4CC  Final   Culture NO GROWTH 1 DAY  Final   Report Status PENDING  Incomplete  MRSA PCR Screening     Status: Abnormal   Collection Time: 12/03/14  7:15 PM  Result Value Ref Range Status   MRSA by PCR POSITIVE (A) NEGATIVE Final    Comment:        The GeneXpert MRSA Assay (FDA approved for NASAL specimens only), is one component of a comprehensive MRSA colonization surveillance program. It is not intended to diagnose MRSA infection nor to guide or monitor treatment for MRSA infections. RESULT CALLED TO, READ BACK BY AND VERIFIED WITH: DANIELS,J ON 12/03/14 AT 2225 BY  LOY,C       Lab Results  Component Value Date   CALCIUM 8.0* 12/04/2014   PHOS 2.2* 06/11/2014       Impression: 1)Renal  ESRD on HD                On MWF schedule                 Pt is to be  Dialyzed today    2)Hypotension                On Midodrine   3)Anemia HGb at goal (9--11)   4)CKD Mineral-Bone Disorder Secondary Hyperparathyroidism present .      On Cinacalcet Phosphorus low-will recheck Calcium when corrected for low albumin is at goal.  5)CHF- hx of Cardiomyopathy Primary MD following  6)Electrolytes  Hyperkalemic     Now better  Hyponatremic     improving  7)Acid base Co2 at goal  8)Liver- Cirrhosis    Had Ascitic tap recently    Stable  9) ID- ? Sepsis from Cath exchange  Vs Paracentesis    On Vanco + Zosyn     Stable      Plan:  Will dialyze today.   Addendum Pt seen on HD    Brennden Masten S 12/05/2014, 9:16  AM

## 2014-12-06 ENCOUNTER — Encounter (HOSPITAL_COMMUNITY): Payer: Self-pay | Admitting: Physical Therapy

## 2014-12-06 DIAGNOSIS — R188 Other ascites: Secondary | ICD-10-CM

## 2014-12-06 LAB — CBC
HCT: 29.5 % — ABNORMAL LOW (ref 39.0–52.0)
Hemoglobin: 9.8 g/dL — ABNORMAL LOW (ref 13.0–17.0)
MCH: 30.7 pg (ref 26.0–34.0)
MCHC: 33.2 g/dL (ref 30.0–36.0)
MCV: 92.5 fL (ref 78.0–100.0)
PLATELETS: 263 10*3/uL (ref 150–400)
RBC: 3.19 MIL/uL — AB (ref 4.22–5.81)
RDW: 14.7 % (ref 11.5–15.5)
WBC: 8.6 10*3/uL (ref 4.0–10.5)

## 2014-12-06 LAB — BASIC METABOLIC PANEL
Anion gap: 13 (ref 5–15)
BUN: 30 mg/dL — ABNORMAL HIGH (ref 6–23)
CO2: 26 mmol/L (ref 19–32)
Calcium: 8.2 mg/dL — ABNORMAL LOW (ref 8.4–10.5)
Chloride: 99 mmol/L (ref 96–112)
Creatinine, Ser: 4.69 mg/dL — ABNORMAL HIGH (ref 0.50–1.35)
GFR, EST AFRICAN AMERICAN: 13 mL/min — AB (ref >90)
GFR, EST NON AFRICAN AMERICAN: 12 mL/min — AB (ref >90)
Glucose, Bld: 78 mg/dL (ref 70–99)
POTASSIUM: 4.2 mmol/L (ref 3.5–5.1)
SODIUM: 138 mmol/L (ref 135–145)

## 2014-12-06 NOTE — Discharge Summary (Signed)
Physician Discharge Summary  [name]:[id number] DOB: 15-Mar-1946 DOA: 12/03/2014  PCP: Glo Herring., MD  Admit date: 12/03/2014 Discharge date: 12/06/2014  Time spent: 40 minutes  Recommendations for Outpatient Follow-up:  1. Follow-up at dialysis center tomorrow as previously scheduled  Discharge Diagnoses:  Active Problems:   Hypotension   Sepsis   Amyloid heart disease   Chronic diastolic congestive heart failure, NYHA class 2 Cirrhosis Ascites ESRD on HD Chronic hypotension Anemia of chronic disease  Discharge Condition: improved  Diet recommendation: low salt  Filed Weights   12/05/14 0500 12/05/14 1530 12/06/14 0500  Weight: 60 kg (132 lb 4.4 oz) 60.4 kg (133 lb 2.5 oz) 62.1 kg (136 lb 14.5 oz)    History of present illness:  This patient was admitted to the hospital with nausea, dizziness/lightheadedness, abdominal cramping and diarrhea. He was found to have a systolic blood pressure in the 70s (which appears to be a chronic issue for him). He was given a bolus of IV fluid in the emergency room and admitted for further treatments.  Hospital Course:  She was monitored in the stepdown unit. His blood pressure remained on the lower side with a systolic blood pressure in the 70s. Patient appears to be tolerating this very well. Review of records indicates that his blood pressures chronically low in the 70s and patient has been tolerating this for several years now. He is on Midodrine 3 times a day. He was followed by nephrology and underwent dialysis per schedule. He was empirically started on intravenous antibiotics for concerns of underlying sepsis. His blood cultures have not shown any significant growth. He has not had any fever or leukocytosis during his hospital stay. He does not appear septic or toxic at this time. Blood cultures have been discontinued and he is doing fairly well. The patient ambulated throughout the nursing unit did not have any dizziness and  felt like he was doing better. Case was discussed with nephrology who felt the patient was also appropriate to discharge home. He will follow up with the dialysis center tomorrow as previously scheduled. His worsening hypotension and dizziness on admission may been related to increased volume removal during dialysis as well as scheduled paracentesis for his underlying ascites. Patient appears to be doing better from a volume standpoint at this time and is ready for discharge home.  Procedures:    Consultations:  Nephrology  Discharge Exam: Filed Vitals:   12/06/14 1200  BP:   Pulse:   Temp: 97 F (36.1 C)  Resp:     General: NAD Cardiovascular: S1, S2 RRR Respiratory: CTA B  Discharge Instructions   Discharge Instructions    Diet - low sodium heart healthy    Complete by:  As directed      Increase activity slowly    Complete by:  As directed           Discharge Medication List as of 12/06/2014 12:24 PM    CONTINUE these medications which have NOT CHANGED   Details  acetaminophen (TYLENOL) 500 MG tablet Take 1,000 mg by mouth every 6 (six) hours as needed for mild pain., Until Discontinued, Historical Med    allopurinol (ZYLOPRIM) 100 MG tablet Take 100 mg by mouth daily., Until Discontinued, Historical Med    aspirin 81 MG tablet Take 81 mg by mouth daily., Until Discontinued, Historical Med    !! b complex-vitamin c-folic acid (NEPHRO-VITE) 0.8 MG TABS tablet Take 1 tablet by mouth at bedtime., Until Discontinued, Historical  Med    cinacalcet (SENSIPAR) 30 MG tablet Take 30 mg by mouth daily., Until Discontinued, Historical Med    HYDROcodone-acetaminophen (NORCO/VICODIN) 5-325 MG per tablet Take 1 tablet by mouth 2 (two) times daily as needed., Starting 11/29/2014, Until Discontinued, Historical Med    midodrine (PROAMATINE) 10 MG tablet Take 10 mg by mouth 3 (three) times daily. , Starting 12/03/2011, Until Discontinued, Historical Med    !! multivitamin (RENA-VIT)  TABS tablet Take 1 tablet by mouth daily., Until Discontinued, Historical Med    omeprazole (PRILOSEC) 20 MG capsule Take 20 mg by mouth 2 (two) times daily before a meal. , Until Discontinued, Historical Med    Q-DRYL 12.5 MG/5ML liquid Take 5 mLs by mouth 2 (two) times daily as needed (dental infection). , Starting 11/29/2014, Until Discontinued, Historical Med    sevelamer (RENVELA) 800 MG tablet Take 1,600 mg by mouth 3 (three) times daily with meals. , Until Discontinued, Historical Med    Venlafaxine HCl 75 MG TB24 Take 75 mg by mouth every morning. , Until Discontinued, Historical Med     !! - Potential duplicate medications found. Please discuss with provider.    STOP taking these medications     amoxicillin (AMOXIL) 500 MG capsule        No Known Allergies Follow-up Information    Follow up with [facility] S, MD.   Specialty:  Nephrology   Why:  follow up at dialysis center tomorrow for scheduled dialysis   Contact information:   [address] [place] [address] [phone]       Follow up with [facility].   Contact information:   [address] [place] [address] [phone]        The results of significant diagnostics from this hospitalization (including imaging, microbiology, ancillary and laboratory) are listed below for reference.    Significant Diagnostic Studies: US Paracentesis  11/07/2014   CLINICAL DATA:  Ascites  EXAM: ULTRASOUND GUIDED  PARACENTESIS  COMPARISON:  None.  PROCEDURE: An ultrasound guided paracentesis was thoroughly discussed with the patient and questions answered. The benefits, risks, alternatives and complications were also discussed. The patient understands and wishes to proceed with the procedure. Written consent was obtained.  Ultrasound was performed to localize and mark an adequate pocket of fluid in the right lower quadrant of the abdomen. The area was then prepped and draped in the  normal sterile fashion. 1% Lidocaine was used for local anesthesia. Under ultrasound guidance a 19 gauge Yueh catheter was introduced. Paracentesis was performed. The catheter was removed and a dressing applied.  Complications: None.  FINDINGS: A total of approximately 6.7 L of serosanguineous fluid was removed. A fluid sample was not sent for laboratory analysis.  IMPRESSION: Successful ultrasound guided paracentesis yielding 6.7 L of ascites.   Electronically Signed   By: Rolm Baptise M.D.   On: 11/07/2014 09:51   Dg Chest Port 1 View  12/03/2014   CLINICAL DATA:  Shortness of breath for 2 days. End-stage renal disease.  EXAM: PORTABLE CHEST - 1 VIEW  COMPARISON:  06/13/2014  FINDINGS: There is right subclavian and left IJ central venous access. The catheter tips are in the right atrium.  Stable cardiomegaly and prominence of the main pulmonary artery. Aortic and hilar contours are negative. There is no edema, consolidation, effusion, or pneumothorax.  IMPRESSION: No active disease.   Electronically Signed   By: Jorje Guild M.D.   On: 12/03/2014 08:56   Ir  Paracentesis  11/22/2014   INDICATION: Patient with history of cardiac amyloidosis, ESRD and recurrent ascites. He underwent placement of a right lower quadrant tunneled peritoneal Aspira drainage catheter at [facility] approximately 1 week ago. He has been unable to coordinate home health services to assist with ascites removal and presents today for repeat paracentesis.  CLINICAL DATA:  As above  EXAM: THERAPEUTIC PARACENTESIS VIA EXISTING RIGHT LOWER QUADRANT PERITONEAL ASPIRA DRAINAGE CATHETER  COMPARISON:  None.  MEDICATIONS: None.  COMPLICATIONS: None immediate  TECHNIQUE: Informed consent was obtained from the patient after a discussion of the risks, benefits and alternatives to treatment. A timeout was performed prior to the initiation of the procedure. Under sterile technique the patient's right lower quadrant tunneled peritoneal catheter  tip was cleansed and connected to the appropriate drainage catheter kit. Approximately 4.5 L of turbid, pink ascitic fluid was removed.  The patient tolerated the procedure well without immediate post procedural complication.  FINDINGS: A total of approximately 4.5 liters of fluid was removed.  IMPRESSION: Successful therapeutic paracentesis via existing tunneled peritoneal catheter yielding 4.5 liters of fluid. Care management was contacted to assist with coordinating [facility] services for patient.  FLUOROSCOPY TIME:  NONE  Read by: Rowe Robert, PA-C   Electronically Signed   By: Aletta Edouard M.D.   On: 11/22/2014 14:41    Microbiology: Recent Results (from the past 240 hour(s))  Culture, blood (routine x 2)     Status: None (Preliminary result)   Collection Time: 12/03/14  9:03 AM  Result Value Ref Range Status   Specimen Description BLOOD RIGHT HAND  Final   Special Requests BOTTLES DRAWN AEROBIC ONLY 4CC  Final   Culture NO GROWTH 3 DAYS  Final   Report Status PENDING  Incomplete  Culture, blood (routine x 2)     Status: None (Preliminary result)   Collection Time: 12/03/14  9:03 AM  Result Value Ref Range Status   Specimen Description BLOOD RIGHT HAND  Final   Special Requests BOTTLES DRAWN AEROBIC ONLY 4CC  Final   Culture NO GROWTH 3 DAYS  Final   Report Status PENDING  Incomplete  MRSA PCR Screening     Status: Abnormal   Collection Time: 12/03/14  7:15 PM  Result Value Ref Range Status   MRSA by PCR POSITIVE (A) NEGATIVE Final    Comment:        The GeneXpert MRSA Assay (FDA approved for NASAL specimens only), is one component of a comprehensive MRSA colonization surveillance program. It is not intended to diagnose MRSA infection nor to guide or monitor treatment for MRSA infections. RESULT CALLED TO, READ BACK BY AND VERIFIED WITH: DANIELS,J ON 12/03/14 AT 2225 BY LOY,C      Labs: Basic Metabolic Panel:  Recent Labs Lab 12/03/14 0849 12/04/14 0319 12/06/14 0409   NA 132* 134* 138  K 5.3* 4.9 4.2  CL 94* 101 99  CO2 22 22 26   GLUCOSE 61* 63* 78  BUN 62* 36* 30*  CREATININE 8.29* 5.74* 4.69*  CALCIUM 7.7* 8.0* 8.2*   Liver Function Tests:  Recent Labs Lab 12/03/14 0849  AST 22  ALT 15  ALKPHOS 189*  BILITOT 1.6*  PROT 6.7  ALBUMIN 1.9*    Recent Labs Lab 12/03/14 0849  LIPASE 22   No results for input(s): AMMONIA in the last 168 hours. CBC:  Recent Labs Lab 12/03/14 0849 12/04/14 0319 12/06/14 0409  WBC 11.9* 10.0 8.6  NEUTROABS 10.7*  --   --  HGB 10.1* 9.9* 9.8*  HCT 30.0* 29.6* 29.5*  MCV 91.5 91.9 92.5  PLT 301 252 263   Cardiac Enzymes:  Recent Labs Lab 12/03/14 0906 12/03/14 1632 12/03/14 2054 12/04/14 0319  TROPONINI 0.09* 0.08* 0.08* 0.09*   BNP: BNP (last 3 results) No results for input(s): BNP in the last 8760 hours.  ProBNP (last 3 results)  Recent Labs  02/12/14 1139 05/09/14 1100  PROBNP 21129.0* 32097.0*    CBG: No results for input(s): GLUCAP in the last 168 hours.     Signed:  MEMON,JEHANZEB  [facility] 12/06/2014, 8:17 PM

## 2014-12-06 NOTE — Progress Notes (Signed)
D/c instructions reviewed with patient, wife, and daughter.  Verbalized understanding.  Pt dc'd to home with wife and daughter. Schonewitz, Eulis Canner 12/06/2014

## 2014-12-06 NOTE — Progress Notes (Signed)
Subjective: Interval History: has no complaint of nausea or vomiting. Presently his 110s feeling much better. Denies also any difficulty breathing..  Objective: Vital signs in last 24 hours: Temp:  [97.5 F (36.4 C)-98.2 F (36.8 C)] 97.5 F (36.4 C) (02/11 0400) Pulse Rate:  [25-119] 32 (02/11 0600) Resp:  [11-23] 17 (02/11 0600) BP: (59-151)/(41-138) 65/49 mmHg (02/11 0600) SpO2:  [91 %-100 %] 94 % (02/11 0605) Weight:  [60.4 kg (133 lb 2.5 oz)-62.1 kg (136 lb 14.5 oz)] 62.1 kg (136 lb 14.5 oz) (02/11 0500) Weight change: 0.4 kg (14.1 oz)  Intake/Output from previous day: 02/10 0701 - 02/11 0700 In: 270 [P.O.:120; IV Piggyback:150] Out: 93  Intake/Output this shift:    General appearance: alert, cooperative and no distress Neck: JVD - 2 cm above sternal notch Resp: diminished breath sounds posterior - bilateral Cardio: regular rate and rhythm, S1, S2 normal, no murmur, click, rub or gallop GI: Distended but not tender. Extremities: extremities normal, atraumatic, no cyanosis or edema  Lab Results:  Recent Labs  12/04/14 0319 12/06/14 0409  WBC 10.0 8.6  HGB 9.9* 9.8*  HCT 29.6* 29.5*  PLT 252 263   BMET:  Recent Labs  12/04/14 0319 12/06/14 0409  NA 134* 138  K 4.9 4.2  CL 101 99  CO2 22 26  GLUCOSE 63* 78  BUN 36* 30*  CREATININE 5.74* 4.69*  CALCIUM 8.0* 8.2*   No results for input(s): PTH in the last 72 hours. Iron Studies: No results for input(s): IRON, TIBC, TRANSFERRIN, FERRITIN in the last 72 hours.  Studies/Results: No results found.  I have reviewed the patient's current medications.  Assessment/Plan: Problem #1 end-stage renal disease status post hemodialysis yesterday. Presently he doesn't have any uremic sinus symptoms. Problem #2 anemia: His hemoglobin is below target goal. Patient is on Epogen Problem #3 history of Amyloid  heart disease Problem #4 hypotension. Problem #5 possible sepsis: . Possible source of infection was thought to  be either from his tunneled catheter site as he has lost his catheter when he came to dialysis and he has a new catheter. Since he was having some abdominal pain after paracentesis spontaneous bacterial peritonitis also entertained. Presently patient abdomen has improved and no other evidence of infection. His blood culture  did show any growth Problem #6 metabolic bone disease: His calcium is in range. Plan: We'll make arrangements for patient to get dialysis tomorrow We'll check his basic metabolic panel in the morning..     LOS: 3 days   Corin Tilly S 12/06/2014,7:44 AM

## 2014-12-06 NOTE — Therapy (Signed)
Patient sitting in chair upon arrival. States he has walked enough this morning with nurses. Denies difficulty with walking. States confident in ambulation up and down stairs for home return. Patient reminded to perform exercises as performed last session with acute physical therapist.   Devona Konig PT DPT [phone]

## 2014-12-06 NOTE — Progress Notes (Signed)
Physical Therapy Treatment Patient Details Name: MCKENNA BORUFF MRN: [id number] DOB: [birth year] Today's Date: 12/06/2014   Patient sitting in chair upon arrival. States he has walked enough this morning with nurses. Denies difficulty with walking. States confident in ambulation up and down stairs for home return. Patient reminded to perform exercises as performed last session with acute physical therapist. Patient states readiness to leave hospital and does not  Want further physical therapy care.  Devona Konig PT DPT [phone]   Freeland Pracht, Leanord Asal R 12/06/2014, 11:39 AM

## 2014-12-07 NOTE — Care Management Utilization Note (Signed)
UR completed 

## 2014-12-08 LAB — CULTURE, BLOOD (ROUTINE X 2)
CULTURE: NO GROWTH
Culture: NO GROWTH

## 2014-12-11 ENCOUNTER — Ambulatory Visit (HOSPITAL_COMMUNITY)
Admission: RE | Admit: 2014-12-11 | Discharge: 2014-12-11 | Disposition: A | Discharge status: Home / Self Care | Payer: Medicare Other | Source: Ambulatory Visit | Attending: Nephrology | Admitting: Nephrology

## 2014-12-11 ENCOUNTER — Other Ambulatory Visit (HOSPITAL_COMMUNITY): Payer: Self-pay | Admitting: Radiology

## 2014-12-11 ENCOUNTER — Other Ambulatory Visit (HOSPITAL_COMMUNITY): Payer: Self-pay | Admitting: Nephrology

## 2014-12-11 DIAGNOSIS — N186 End stage renal disease: Secondary | ICD-10-CM

## 2014-12-11 DIAGNOSIS — T8249XA Other complication of vascular dialysis catheter, initial encounter: Secondary | ICD-10-CM

## 2014-12-11 DIAGNOSIS — Z992 Dependence on renal dialysis: Secondary | ICD-10-CM | POA: Insufficient documentation

## 2014-12-11 DIAGNOSIS — Y839 Surgical procedure, unspecified as the cause of abnormal reaction of the patient, or of later complication, without mention of misadventure at the time of the procedure: Secondary | ICD-10-CM | POA: Insufficient documentation

## 2014-12-11 DIAGNOSIS — T8241XA Breakdown (mechanical) of vascular dialysis catheter, initial encounter: Secondary | ICD-10-CM | POA: Diagnosis present

## 2014-12-11 MED ORDER — SODIUM CHLORIDE 0.9 % IV SOLN: Freq: Once | INTRAVENOUS | Status: DC

## 2014-12-11 MED ORDER — LIDOCAINE HCL 1 % IJ SOLN
INTRAMUSCULAR | Status: AC
  Filled 2014-12-11: qty 20

## 2014-12-11 MED ORDER — CEFAZOLIN SODIUM-DEXTROSE 2-3 GM-% IV SOLR
INTRAVENOUS | Status: AC
  Filled 2014-12-11: qty 50

## 2014-12-11 MED ORDER — HEPARIN SODIUM (PORCINE) 1000 UNIT/ML IJ SOLN
INTRAMUSCULAR | Status: AC
  Filled 2014-12-11: qty 1

## 2014-12-11 MED ORDER — CHLORHEXIDINE GLUCONATE 4 % EX LIQD
CUTANEOUS | Status: AC
  Filled 2014-12-11: qty 15

## 2014-12-11 MED ORDER — CEFAZOLIN SODIUM-DEXTROSE 2-3 GM-% IV SOLR
2.0000 g | Freq: Once | INTRAVENOUS | Status: AC
  Administered 2014-12-11: 2 g via INTRAVENOUS

## 2014-12-11 NOTE — Progress Notes (Signed)
Patient ID: [name], male   DOB: February 19, 1946, 69 y.o.   MRN: [id number]    Pt presents to [facility] IR today upon request of Dr Hinda Lenis  R neck Hemodialysis catheter malfunction Last used successfully Fr 12/07/14 Attempted use 2/15---"clotted"  Catheter was placed here in IR 07/25/2014  Now scheduled for HD catheter exchange  Pt aware of procedure benefits and risks and agreeable to proceed Consent signed andin chart Ancef ordered

## 2014-12-11 NOTE — Procedures (Signed)
Successful fluoroscopic guided exchange of existing right subclavian approach dialysis catheter.  Catheter is ready for immediate use.  No immediate post procedural complications.

## 2014-12-12 ENCOUNTER — Telehealth (HOSPITAL_COMMUNITY): Payer: Self-pay | Admitting: Radiology

## 2014-12-12 ENCOUNTER — Inpatient Hospital Stay (HOSPITAL_COMMUNITY): Admission: RE | Admit: 2014-12-12 | Payer: Medicare Other | Source: Ambulatory Visit

## 2014-12-12 NOTE — Telephone Encounter (Signed)
Called patient's wife to check on patient.  Patient was expected in [facility] at 1500.  Patient's daughter answered the phone and relayed the information that the patient had passed away while the family was trying to get him to his appointment with [place], but they have not had enough time to let us know.  I appologizied to the daughter and informed her that we would contact any outstanding appointments in [facility], that the patient was deceased.  There were not any other appointments in [facility] to cancel.

## 2014-12-24 ENCOUNTER — Encounter (INDEPENDENT_AMBULATORY_CARE_PROVIDER_SITE_OTHER): Payer: Self-pay | Admitting: *Deleted

## 2014-12-24 ENCOUNTER — Telehealth (INDEPENDENT_AMBULATORY_CARE_PROVIDER_SITE_OTHER): Payer: Self-pay | Admitting: *Deleted

## 2014-12-24 NOTE — Telephone Encounter (Signed)
[name] No Showed for his apt with Dr. Laural Golden on 12/04/14. A NS letter has been mailed.

## 2014-12-24 NOTE — Telephone Encounter (Signed)
Noted  

## 2014-12-25 DEATH — deceased

## 2015-02-17 NOTE — Op Note (Signed)
PATIENT NAME:  [name], [name] MR#:  [id number] DATE OF BIRTH:  03/30/1946  DATE OF PROCEDURE:  10/26/2011  PREOPERATIVE DIAGNOSIS: Endstage renal disease with failed left arm HeRO graft despite anticoagulation.  POSTOPERATIVE DIAGNOSIS: Endstage renal disease with failed left arm HeRO graft despite anticoagulation.  PROCEDURE: 1. Ultrasound guidance for vascular access, right jugular vein.  2. Right jugular venogram.  3. Fluoroscopic guidance for placement of catheter.  4. Placement of a 19-cm tip to cuff tunneled hemodialysis catheter.   SURGEON: Algernon Huxley, M.D.   ANESTHESIA: Local with moderate conscious sedation.   ESTIMATED BLOOD LOSS: Approximately 50 mL.   FLUOROSCOPY TIME: Less than 1 minute.  CONTRAST USED: 5 mL.   INDICATION FOR PROCEDURE: This is a gentleman known to me for his dialysis access needs. He has a HeRO graft that has failed on multiple occasions over the past several weeks despite being fully and sometimes over-anticoagulated. His right arm had previously shown a usable vein for fistula, and I think at this point it was prudent to abandon the HeRO and think about placing a fistula on the other side at a later date. He will need a dialysis catheter in this transition and this will be placed today. Risks and benefits were discussed. Informed consent was obtained.   DESCRIPTION OF PROCEDURE: The patient was brought to the vascular interventional radiology suite. Right neck and chest were sterilely prepped and draped and a sterile surgical field was created. The jugular vein was found to be large and patent in the neck and was accessed under direct ultrasound guidance without difficulty with a Seldinger needle. Initially the wire would not pass easily and a needle venogram was performed. The jugular vein was patent, just slightly tortuous and our needle was against the back wall. At this point, I redirected and the wire passed easily. After skin nick and dilatation the  peel-away sheath was placed over the wire and the wire and dilator were removed. I then made a counterincision in the right chest, tunneled from the subclavicular incision to the access site, and a selected a 19-cm tip to cuff tunneled hemodialysis catheter based on fluoroscopic landmarks. This was placed through the peel-away sheath and the peel-away sheath was removed. Appropriate distal connectors were placed. Initially there was a slight kink which improved with gentle traction on the catheter. Both lumens withdrew blood well and flushed easily with heparinized saline and was then packed with concentrated heparin. This catheter was secured to the skin with 2 Prolene sutures. A 4-0 Monocryl was placed around the exit site and a 4-0 Monocryl was placed at the access site. Sterile dressings were placed. The patient tolerated the procedure well.    ____________________________ Algernon Huxley, MD jsd:bjt D: 10/26/2011 15:45:28 ET T: 10/26/2011 17:28:53 ET JOB#: [id number]  cc: Algernon Huxley, MD, <Dictator> Algernon Huxley MD ELECTRONICALLY SIGNED 11/13/2011 7:56

## 2016-01-01 IMAGING — CR DG FEMUR 2+V PORT*L*
4 series · 4 of 4 positions shown · non-contrast
Comparison: None.

CLINICAL DATA: Status post fall

EXAM:
PORTABLE LEFT FEMUR - 2 VIEW

[AP (1 of 3)]
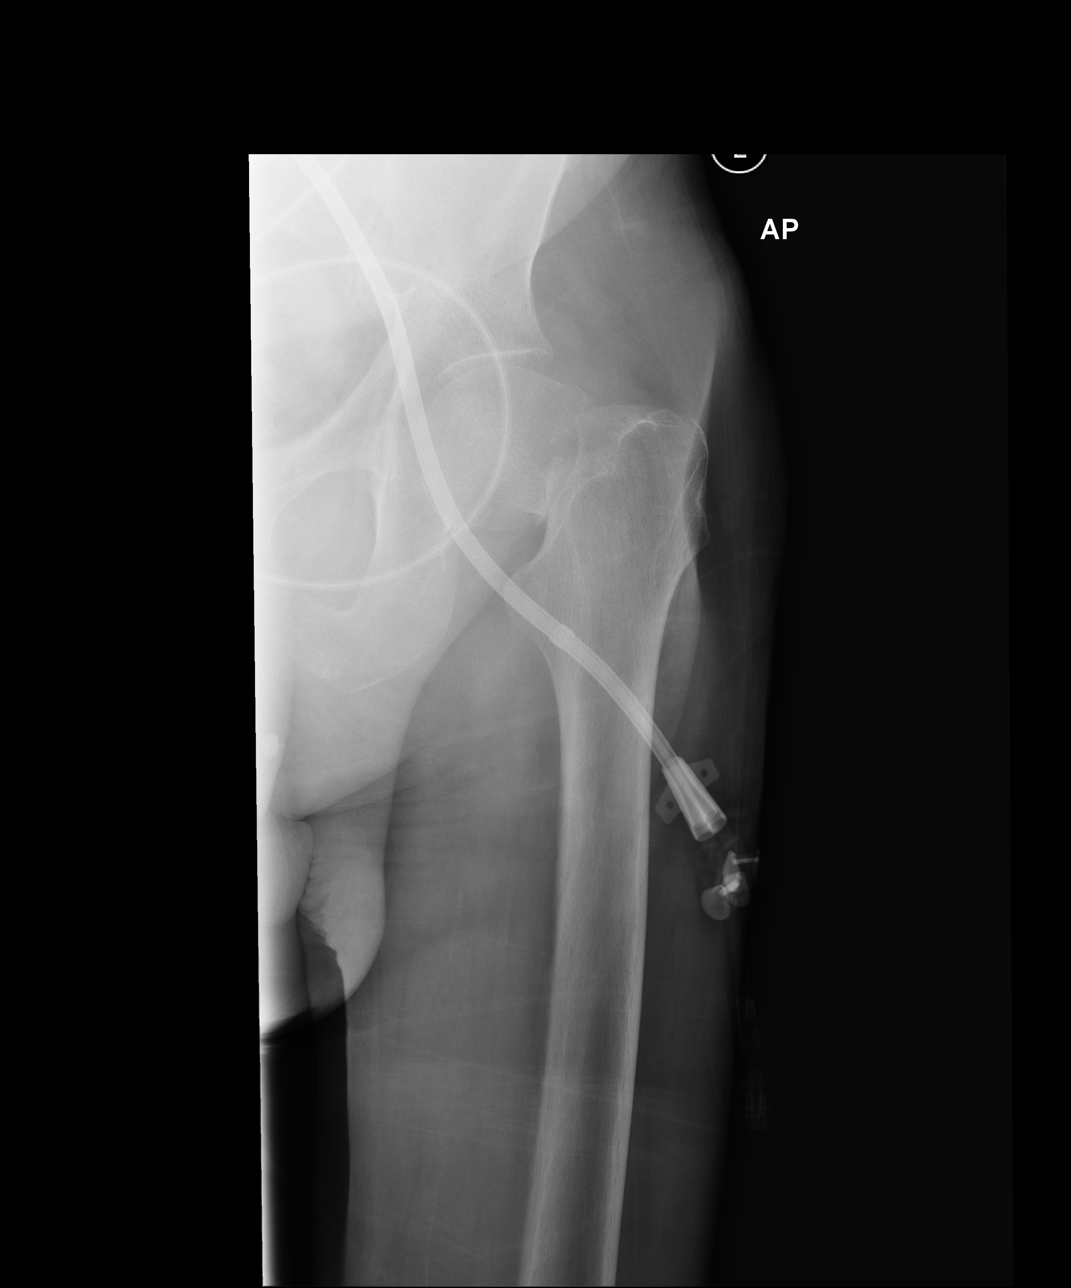

[xtable lateral]
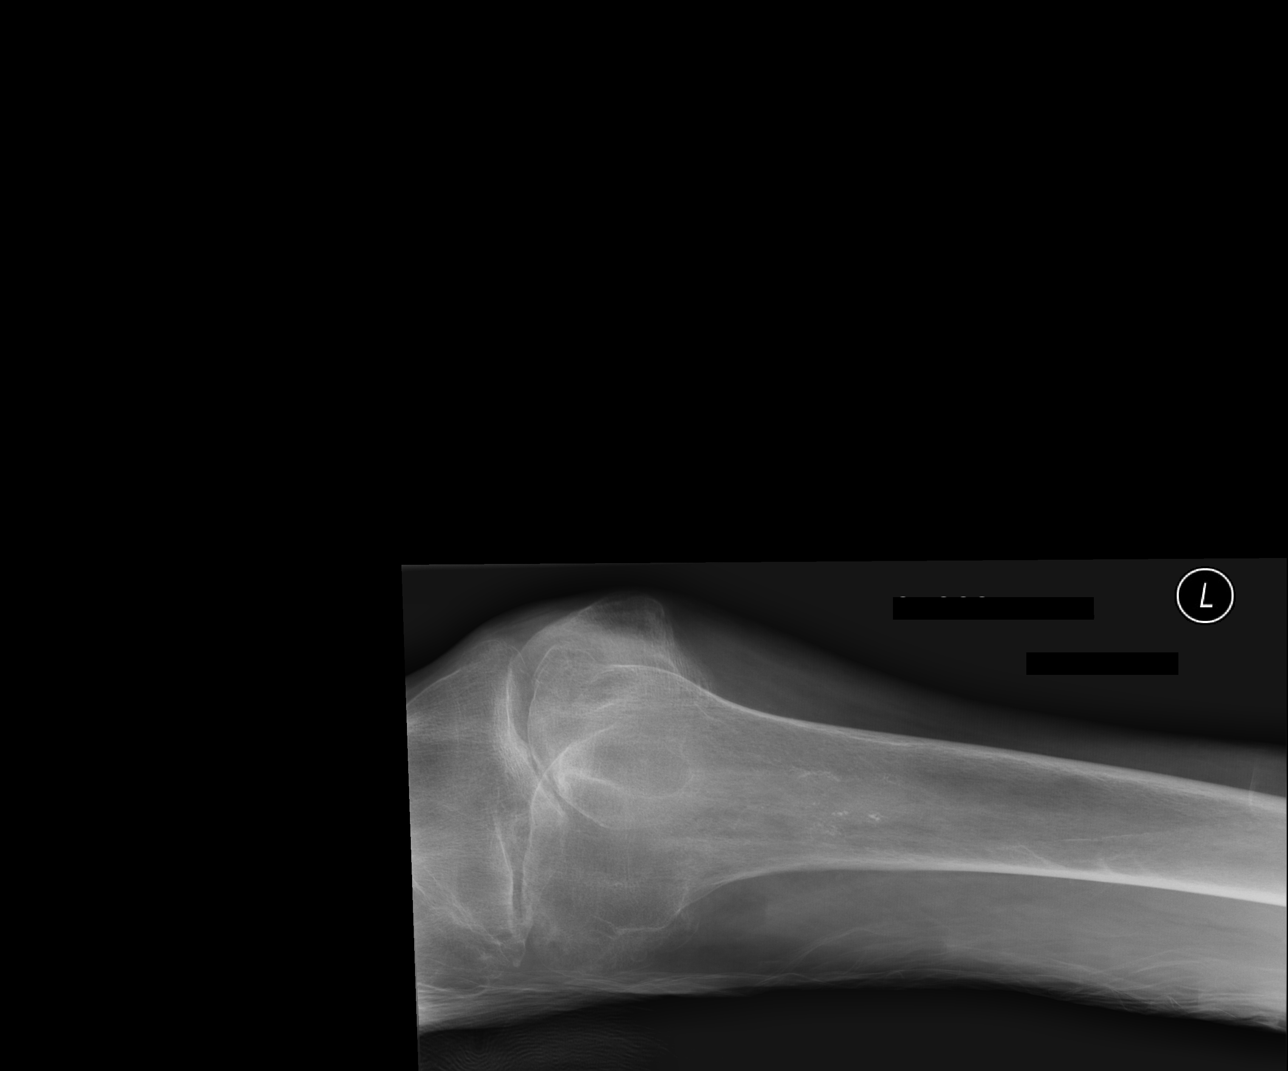

[AP (2 of 3)]
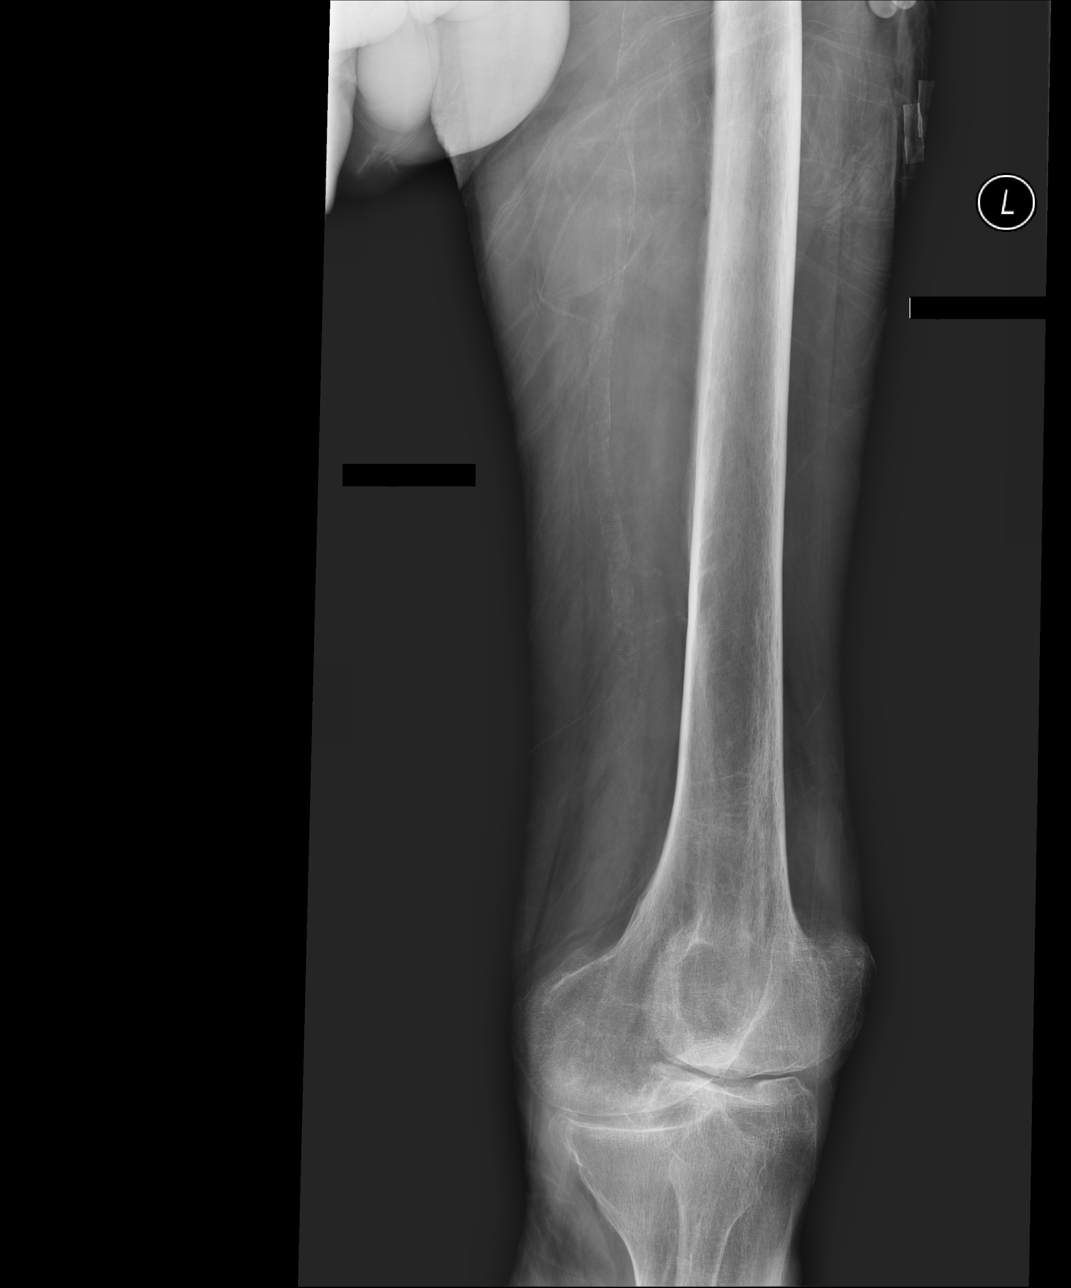

[AP (3 of 3)]
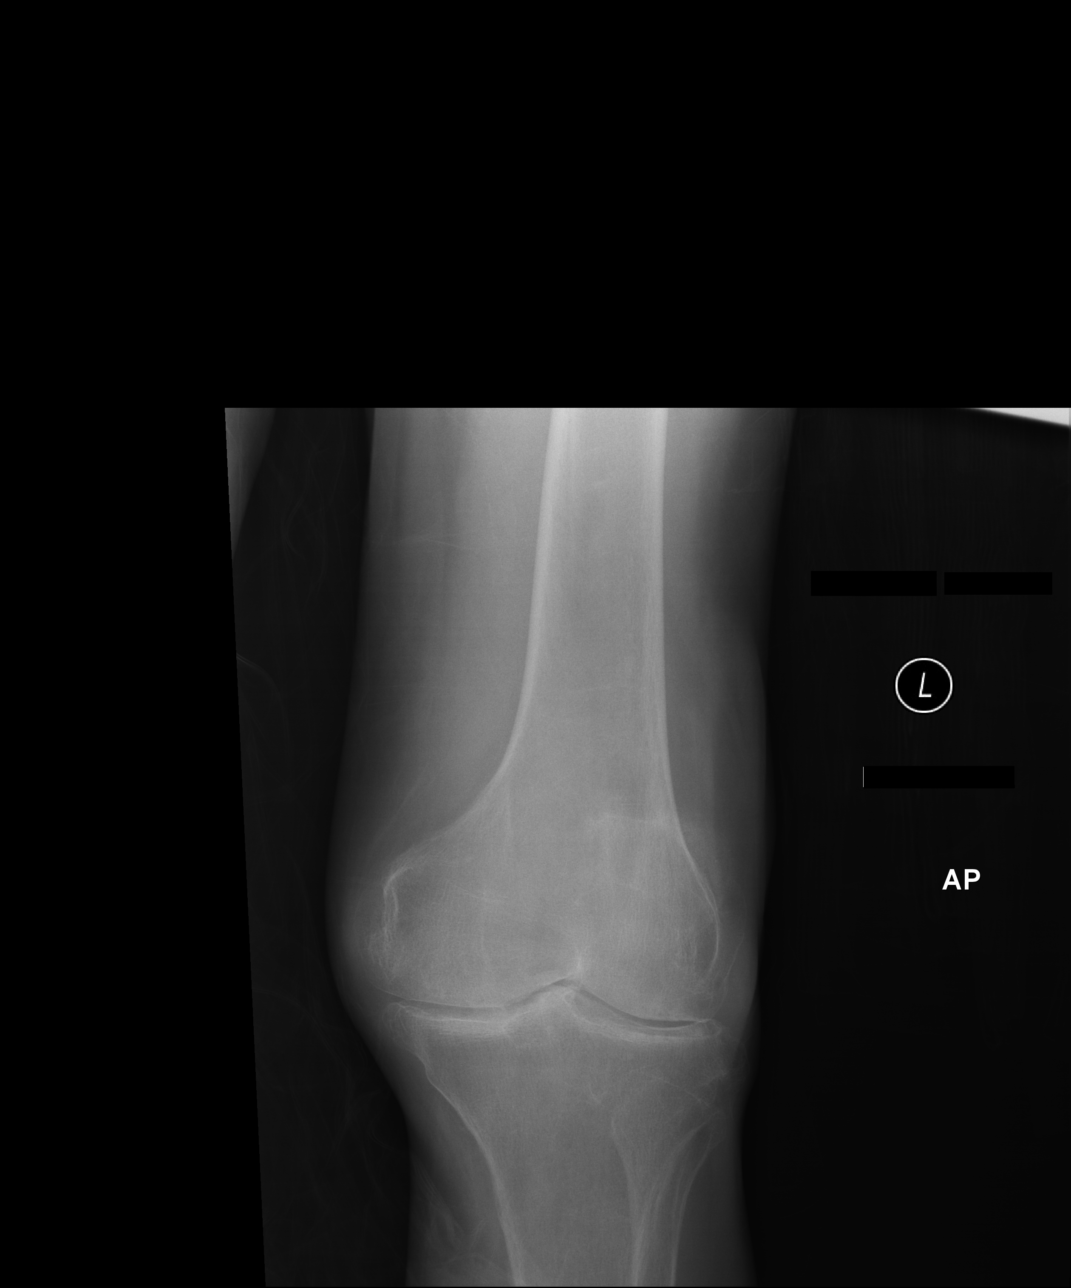

[4 of 4 positions shown; findings below may reference images not displayed]

FINDINGS: Mildly displaced and angulated left femoral neck fracture. No
dislocation. No lytic or sclerotic osseous lesion. Tricompartmental
osteoarthritis of the left knee.
IMPRESSION: Mildly displaced and angulated left femoral neck fracture.

## 2016-01-01 IMAGING — CR DG HIP (WITH OR WITHOUT PELVIS) 2-3V*L*
4 series · 4 of 4 positions shown · non-contrast
Comparison: None.

CLINICAL DATA: Left hip pain secondary to a fall.

EXAM:
LEFT HIP - COMPLETE 2+ VIEW

[t pelvis ap]
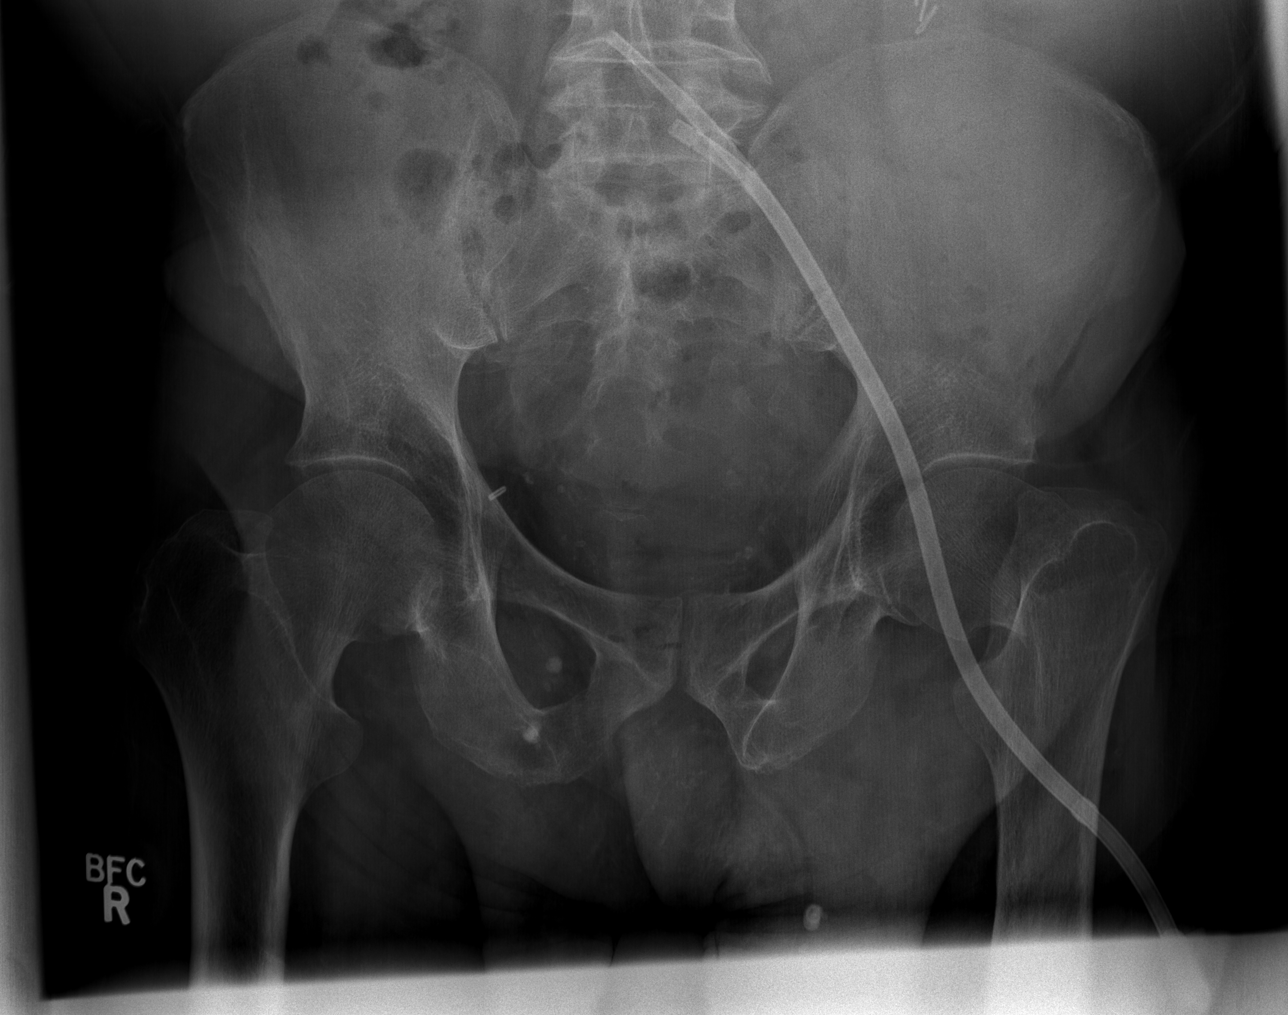

[t hip ap left]
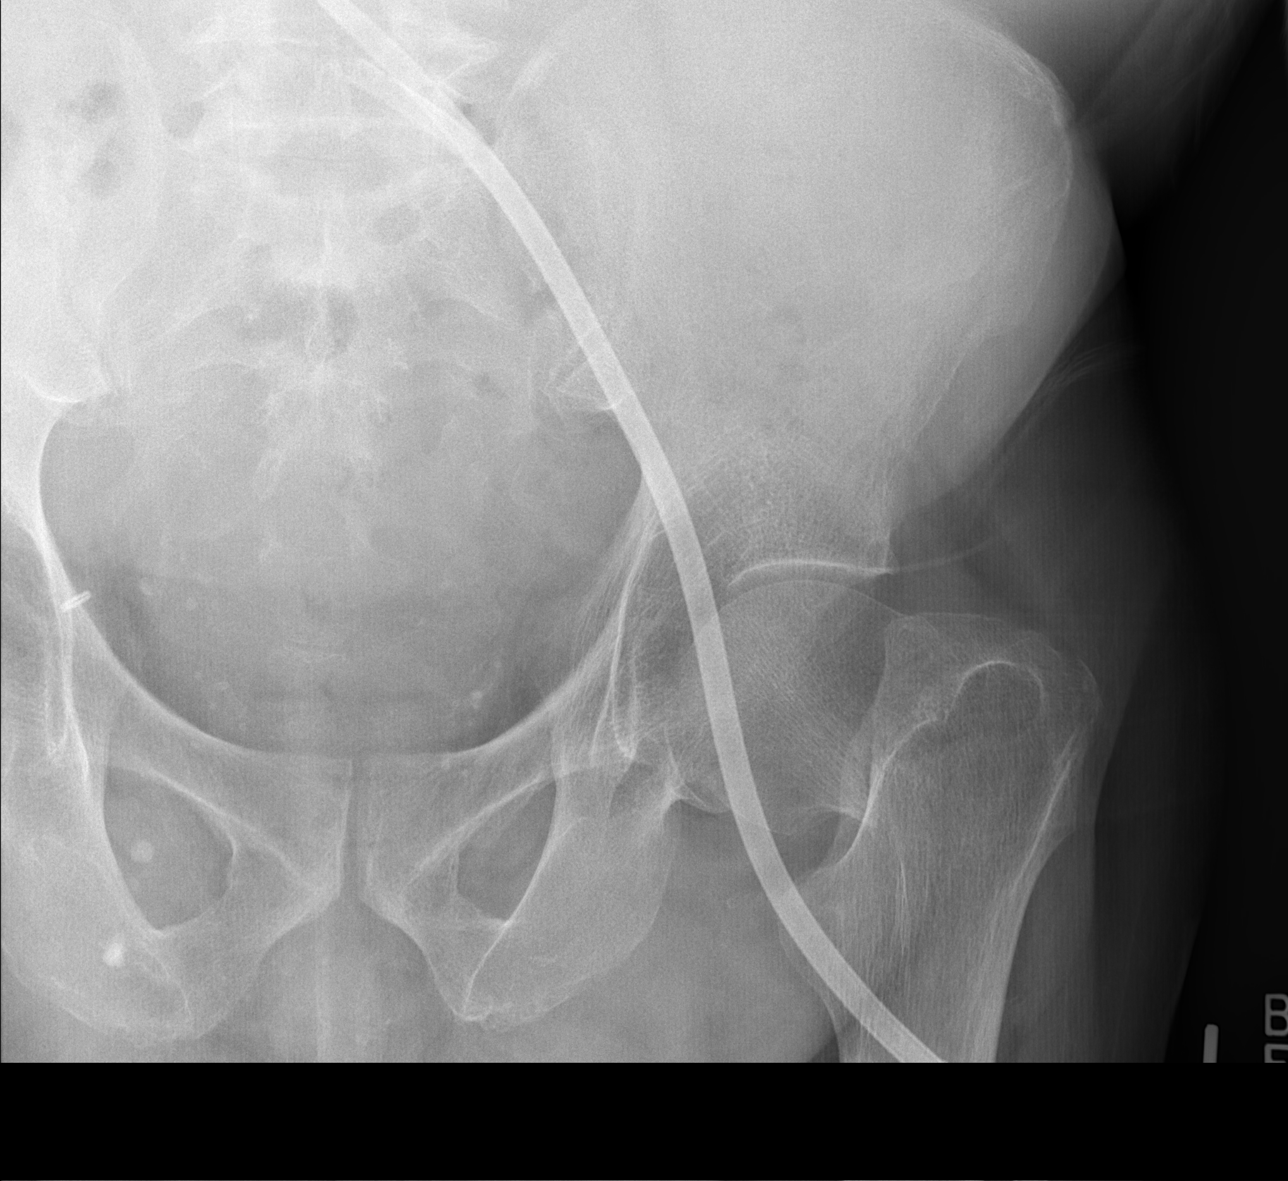

[x hip lat left (1 of 2)]
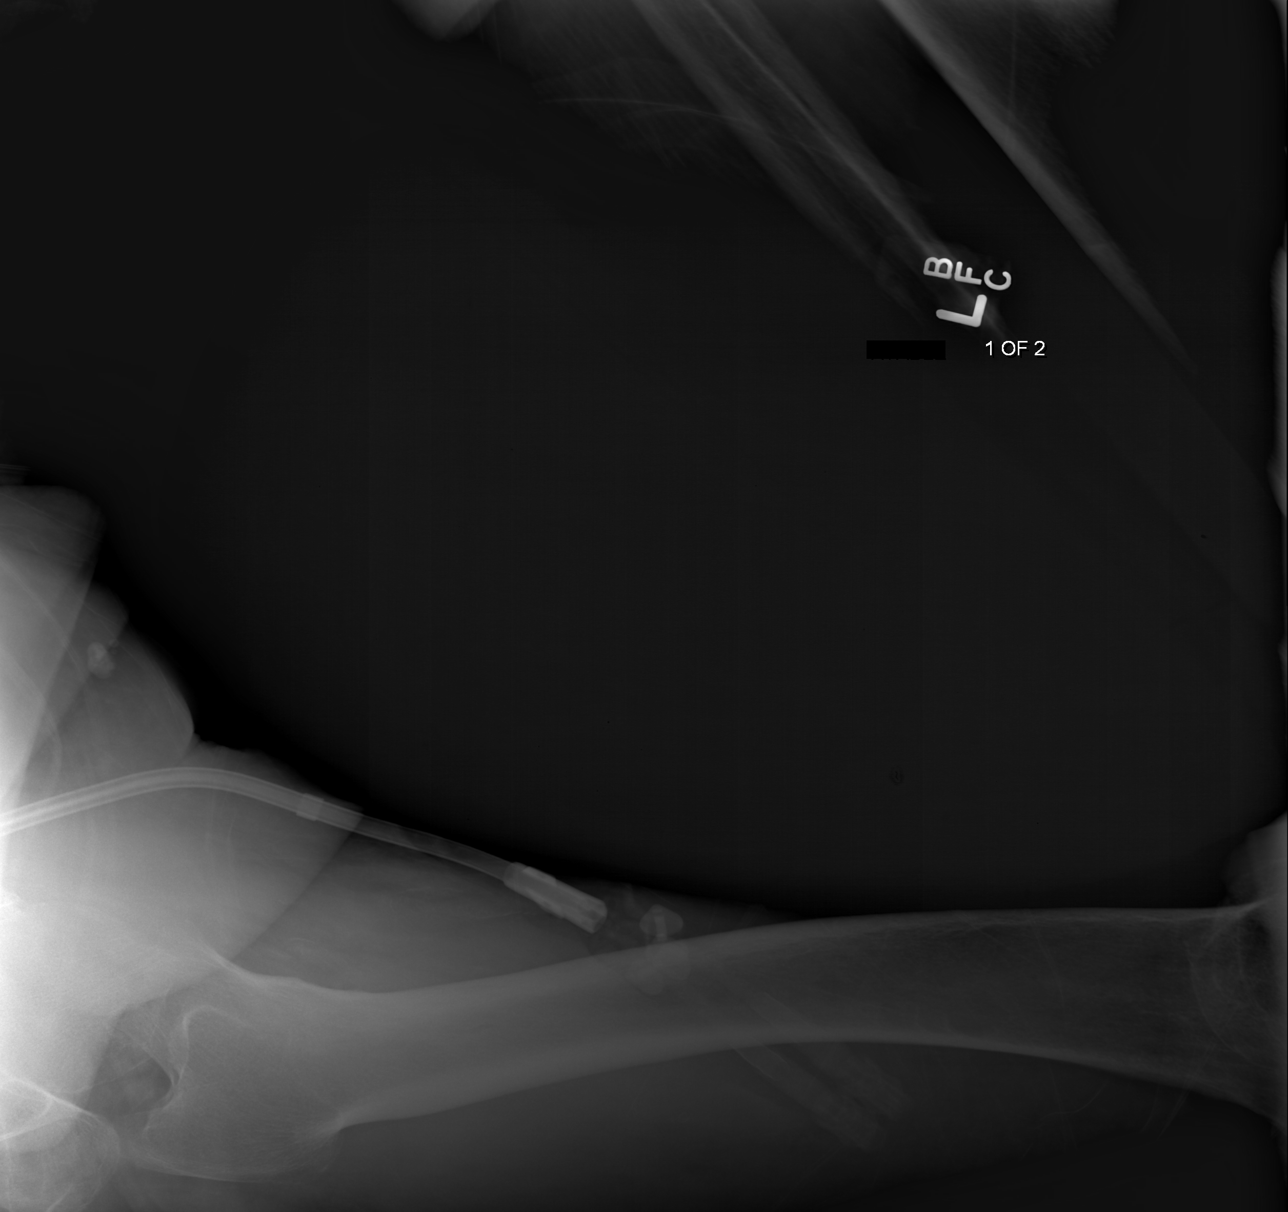

[x hip lat left (2 of 2)]
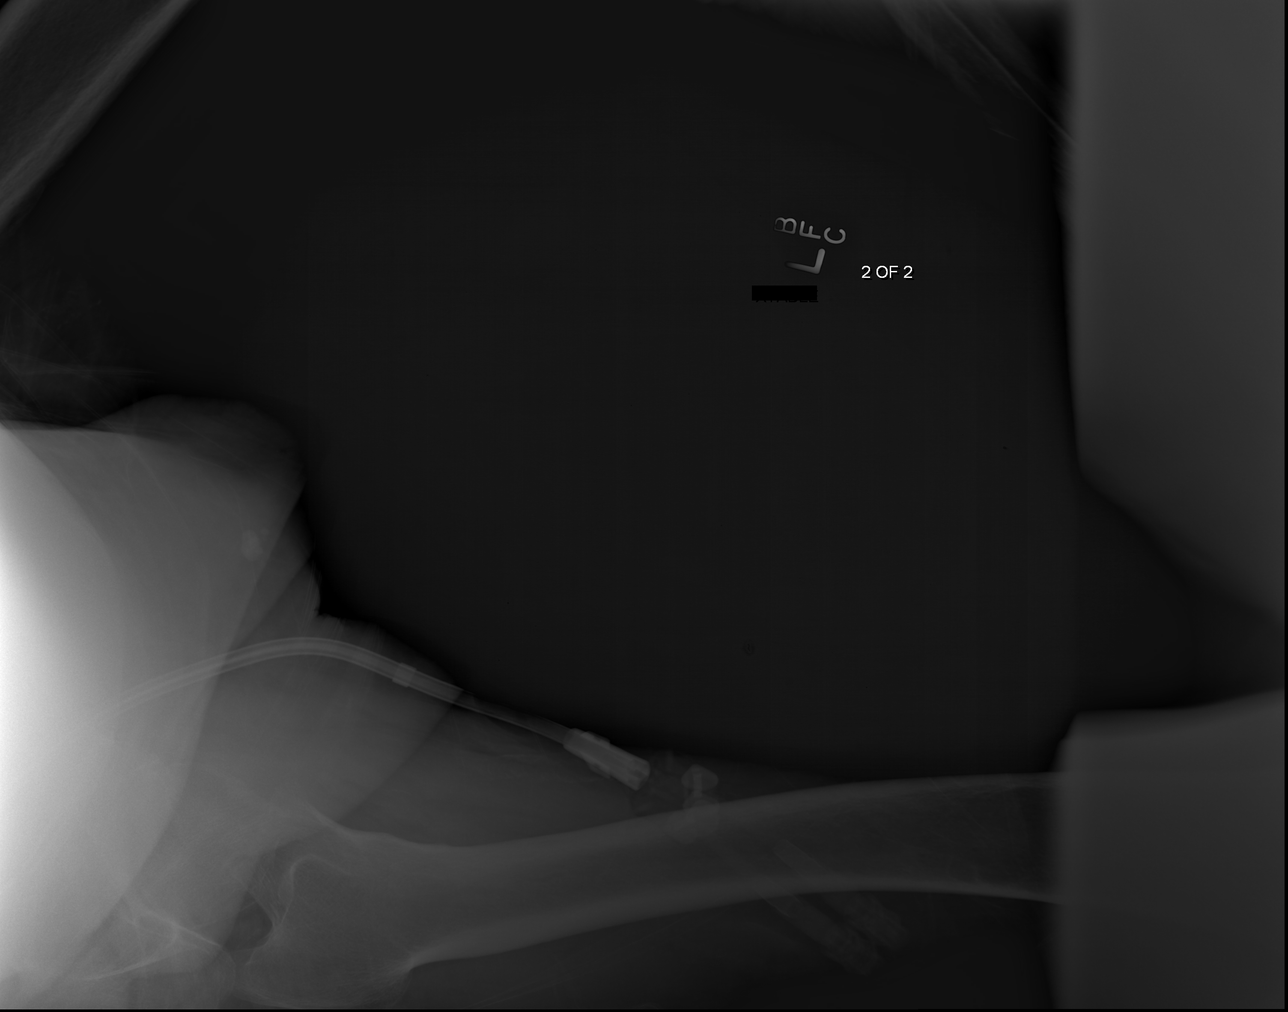

[4 of 4 positions shown; findings below may reference images not displayed]

FINDINGS: There is an angulated overriding subcapital fracture of the left
femoral neck. Osteopenia. Pelvic bones are intact.
IMPRESSION: Angulated subcapital fracture of the proximal left femur.

## 2016-02-13 IMAGING — CT CT HEAD W/O CM
1 series · 16 of 30 positions shown, 20 images · non-contrast
Comparison: None

CLINICAL DATA: Stroke 1 week ago. Now complaining of RIGHT shoulder
pain.

EXAM:
CT HEAD WITHOUT CONTRAST
TECHNIQUE: Contiguous axial images were obtained from the base of the skull
through the vertex without contrast.

[Series 2: headseq 4.8 h37s · axial · 0.43mm/px · z∈[+1032,+1189]mm · 16 of 36 slices shown, 20 images]
[im 2/36  brain]
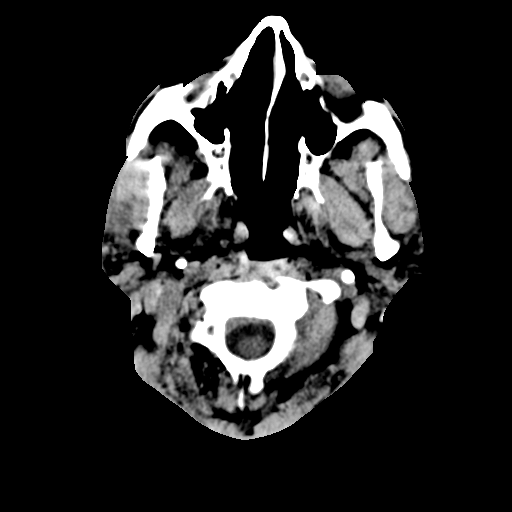
[im 2/36  bone]
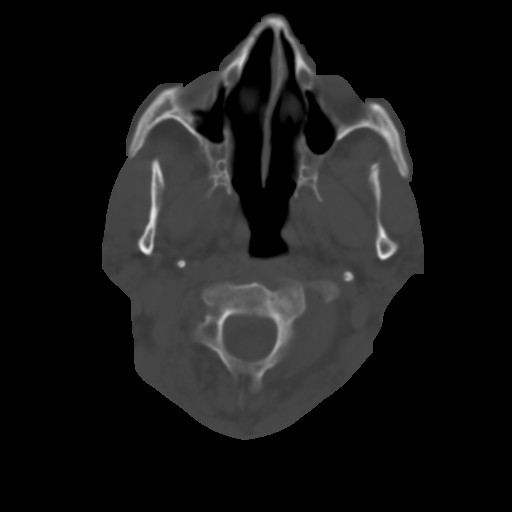
[im 4/36  brain]
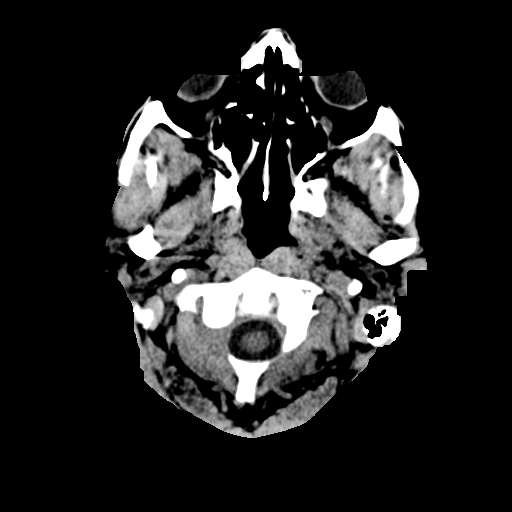
[im 7/36  brain]
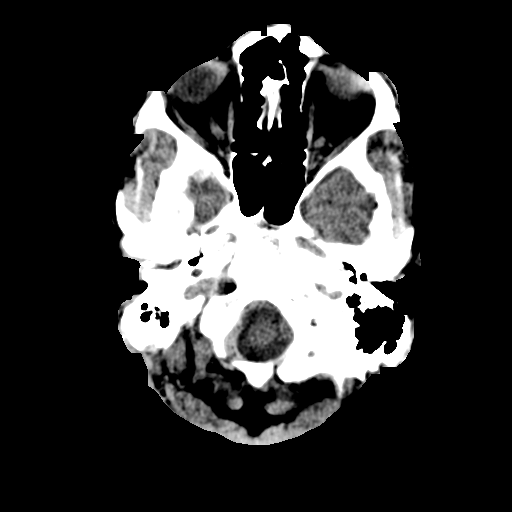
[im 9/36  brain]
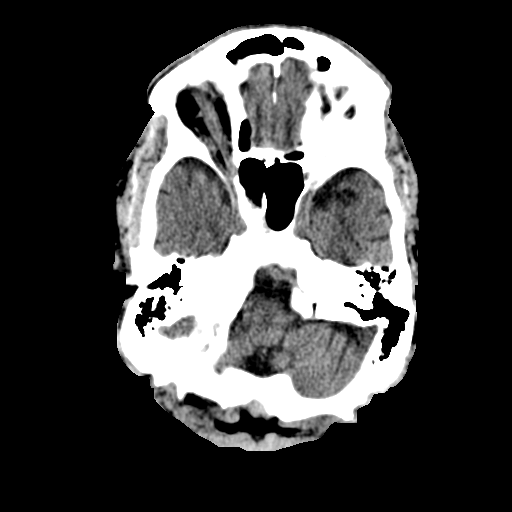
[im 10/36  brain]
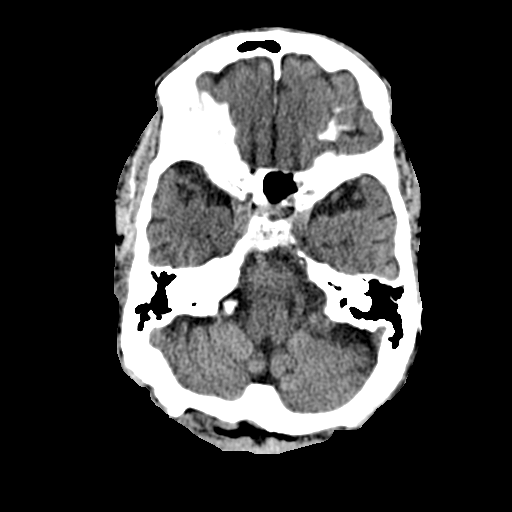
[im 10/36  bone]
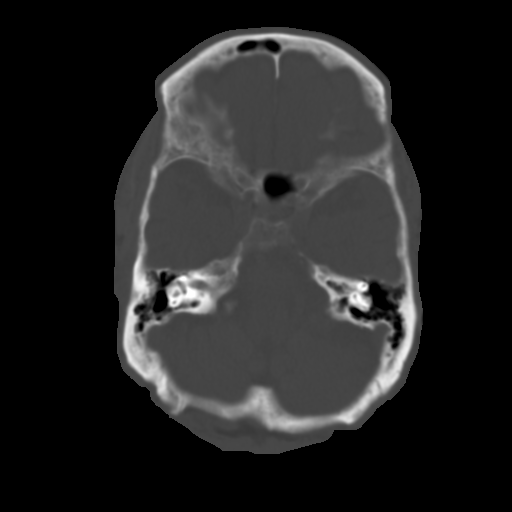
[im 13/36  brain]
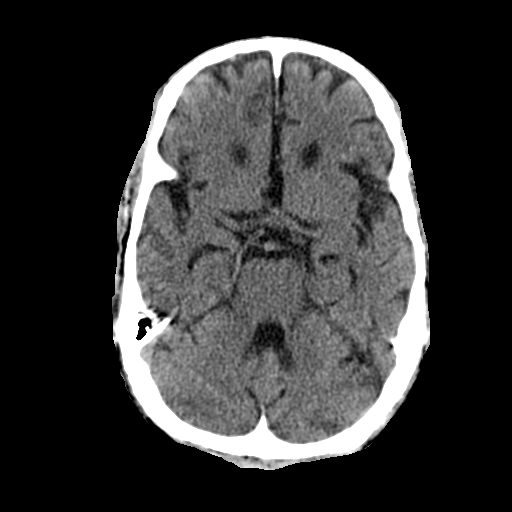
[im 15/36  brain]
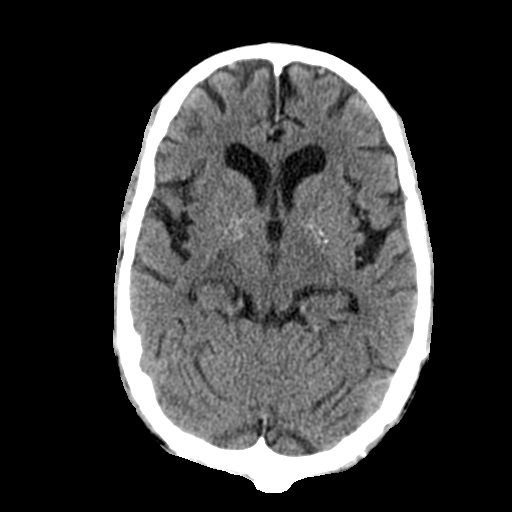
[im 17/36  brain]
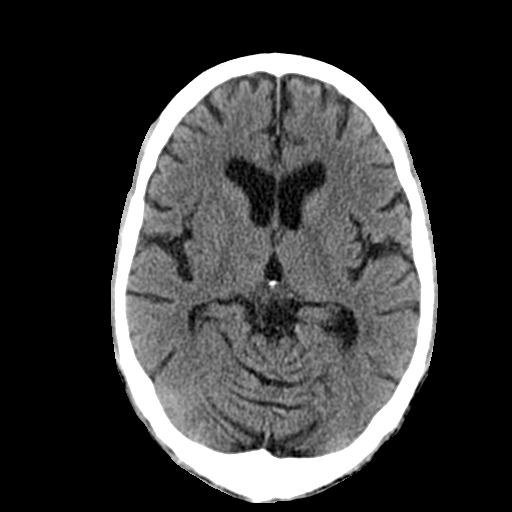
[im 19/36  brain]
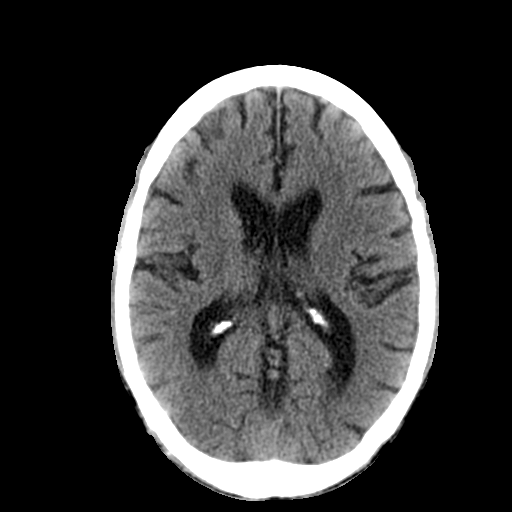
[im 19/36  bone]
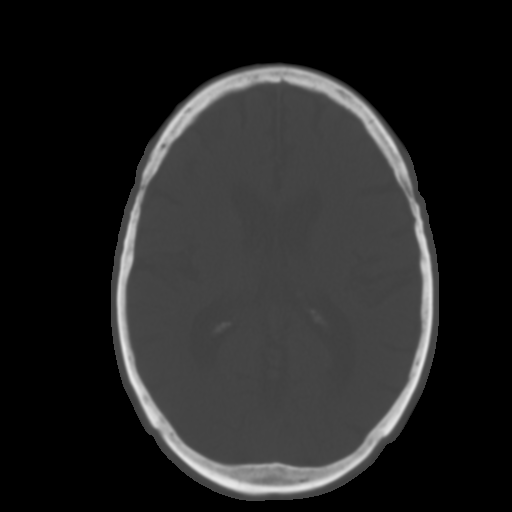
[im 21/36  brain]
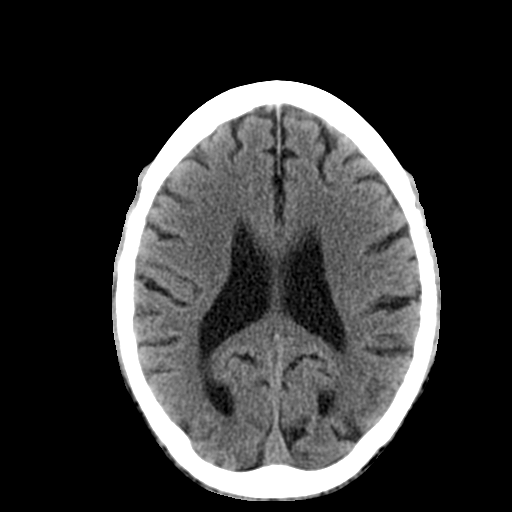
[im 23/36  brain]
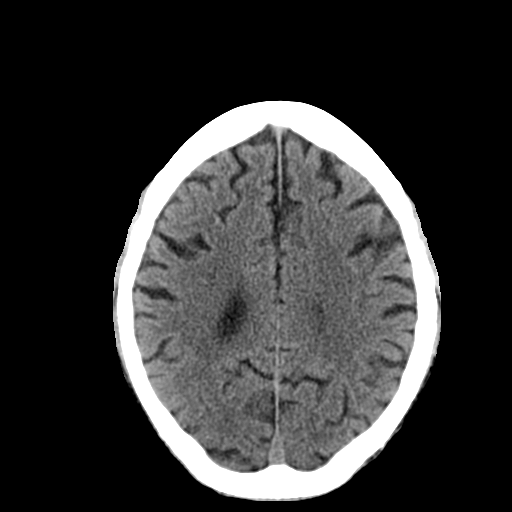
[im 26/36  brain]
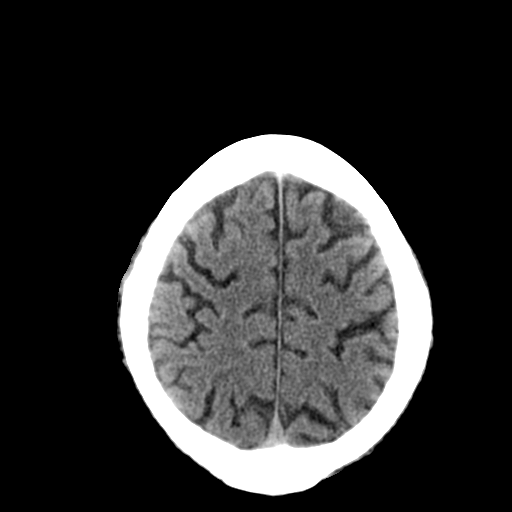
[im 27/36  brain]
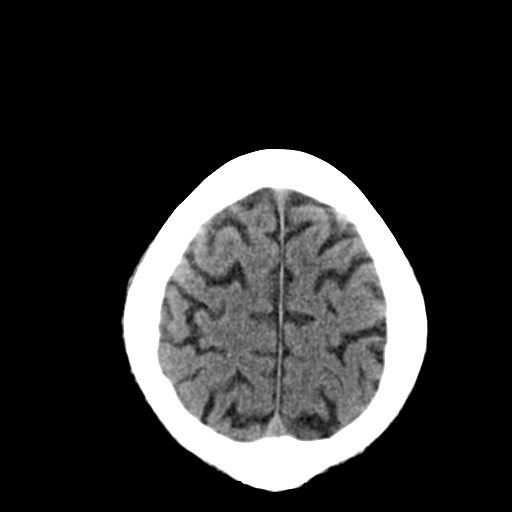
[im 27/36  bone]
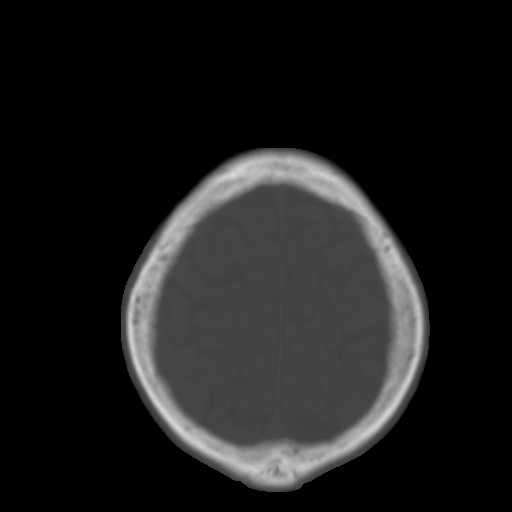
[im 29/36  brain]
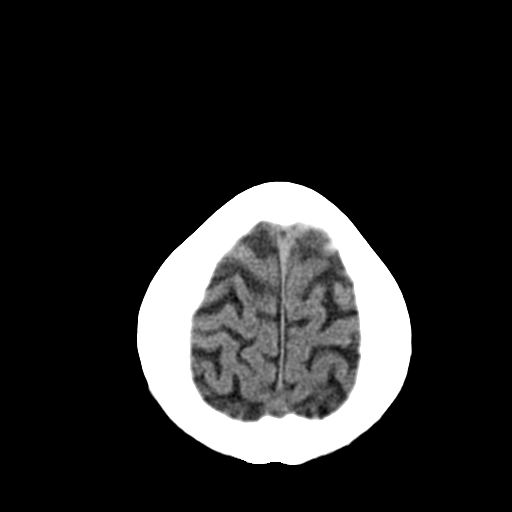
[im 32/36  brain]
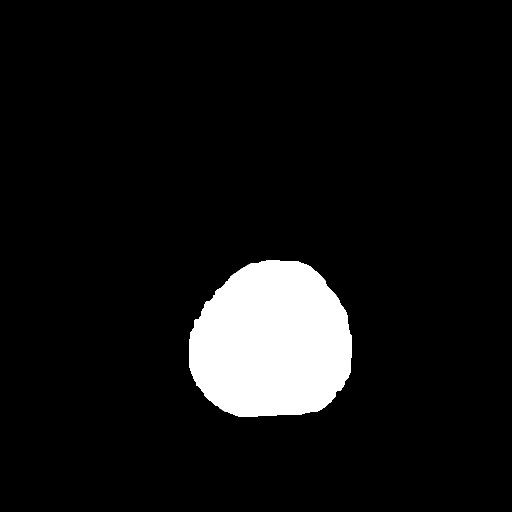
[im 34/36  brain]
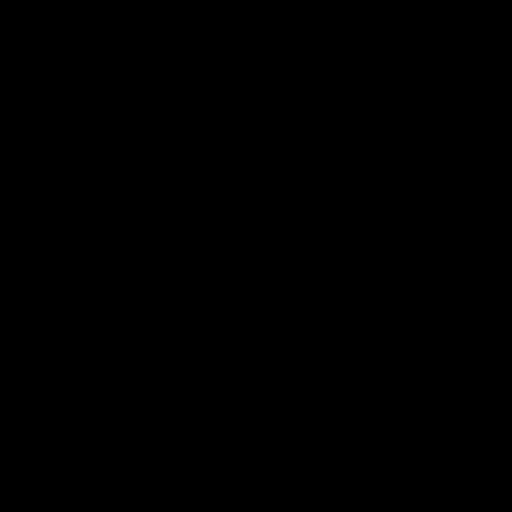

[16 of 30 positions shown; findings below may reference images not displayed]

FINDINGS: No evidence for acute infarction, hemorrhage, mass lesion,
hydrocephalus, or extra-axial fluid. Generalized cerebral and
cerebellar atrophy. Hypoattenuation of white matter consistent with
small vessel disease. Vascular calcification. The calvarium is
intact without fracture or worrisome osseous lesion. No acute sinus
or mastoid disease is evident.
IMPRESSION: Chronic changes as described.  No acute intracranial findings.
